# Patient Record
Sex: Female | Born: 1945 | Race: White | Hispanic: No | Smoking: Never smoker
Health system: Southern US, Community
[De-identification: ages and names within clinical notes are randomized; demographics above are authoritative.]

## PROBLEM LIST (undated history)

## (undated) DIAGNOSIS — I341 Nonrheumatic mitral (valve) prolapse: Secondary | ICD-10-CM

## (undated) DIAGNOSIS — F418 Other specified anxiety disorders: Secondary | ICD-10-CM

## (undated) DIAGNOSIS — E611 Iron deficiency: Secondary | ICD-10-CM

## (undated) DIAGNOSIS — G3184 Mild cognitive impairment, so stated: Secondary | ICD-10-CM

## (undated) HISTORY — PX: TEAR DUCT PROBING: SHX793

---

## 1999-03-12 ENCOUNTER — Ambulatory Visit (HOSPITAL_COMMUNITY): Admission: RE | Admit: 1999-03-12 | Discharge: 1999-03-12 | Payer: Self-pay | Admitting: Gastroenterology

## 1999-11-13 ENCOUNTER — Encounter (INDEPENDENT_AMBULATORY_CARE_PROVIDER_SITE_OTHER): Payer: Self-pay | Admitting: Specialist

## 1999-11-13 ENCOUNTER — Ambulatory Visit (HOSPITAL_COMMUNITY): Admission: RE | Admit: 1999-11-13 | Discharge: 1999-11-13 | Payer: Self-pay | Admitting: Urology

## 2000-03-25 ENCOUNTER — Encounter (INDEPENDENT_AMBULATORY_CARE_PROVIDER_SITE_OTHER): Payer: Self-pay

## 2000-03-25 ENCOUNTER — Ambulatory Visit (HOSPITAL_COMMUNITY): Admission: RE | Admit: 2000-03-25 | Discharge: 2000-03-25 | Payer: Self-pay | Admitting: Urology

## 2001-02-27 ENCOUNTER — Encounter: Admission: RE | Admit: 2001-02-27 | Discharge: 2001-02-27 | Payer: Self-pay | Admitting: Emergency Medicine

## 2001-02-27 ENCOUNTER — Encounter: Payer: Self-pay | Admitting: Emergency Medicine

## 2001-06-03 ENCOUNTER — Encounter: Payer: Self-pay | Admitting: Emergency Medicine

## 2001-06-03 ENCOUNTER — Encounter: Admission: RE | Admit: 2001-06-03 | Discharge: 2001-06-03 | Payer: Self-pay | Admitting: Emergency Medicine

## 2002-07-15 ENCOUNTER — Encounter: Admission: RE | Admit: 2002-07-15 | Discharge: 2002-07-15 | Payer: Self-pay | Admitting: Emergency Medicine

## 2002-07-15 ENCOUNTER — Encounter: Payer: Self-pay | Admitting: Emergency Medicine

## 2003-02-10 ENCOUNTER — Encounter: Admission: RE | Admit: 2003-02-10 | Discharge: 2003-02-10 | Payer: Self-pay | Admitting: Emergency Medicine

## 2003-02-10 ENCOUNTER — Encounter: Payer: Self-pay | Admitting: Emergency Medicine

## 2003-03-17 ENCOUNTER — Encounter: Admission: RE | Admit: 2003-03-17 | Discharge: 2003-03-17 | Payer: Self-pay | Admitting: Emergency Medicine

## 2003-03-17 ENCOUNTER — Encounter: Payer: Self-pay | Admitting: Emergency Medicine

## 2004-03-15 ENCOUNTER — Ambulatory Visit (HOSPITAL_COMMUNITY): Admission: RE | Admit: 2004-03-15 | Discharge: 2004-03-15 | Payer: Self-pay | Admitting: Emergency Medicine

## 2005-03-12 ENCOUNTER — Encounter: Admission: RE | Admit: 2005-03-12 | Discharge: 2005-03-12 | Payer: Self-pay | Admitting: Emergency Medicine

## 2005-04-12 ENCOUNTER — Ambulatory Visit (HOSPITAL_COMMUNITY): Admission: RE | Admit: 2005-04-12 | Discharge: 2005-04-12 | Payer: Self-pay | Admitting: Emergency Medicine

## 2006-01-16 ENCOUNTER — Encounter: Admission: RE | Admit: 2006-01-16 | Discharge: 2006-01-16 | Payer: Self-pay | Admitting: Emergency Medicine

## 2006-03-24 ENCOUNTER — Encounter: Admission: RE | Admit: 2006-03-24 | Discharge: 2006-03-24 | Payer: Self-pay | Admitting: Emergency Medicine

## 2007-08-19 ENCOUNTER — Encounter: Admission: RE | Admit: 2007-08-19 | Discharge: 2007-08-19 | Payer: Self-pay | Admitting: Emergency Medicine

## 2008-07-29 ENCOUNTER — Encounter: Admission: RE | Admit: 2008-07-29 | Discharge: 2008-07-29 | Payer: Self-pay | Admitting: Emergency Medicine

## 2009-08-10 ENCOUNTER — Encounter: Admission: RE | Admit: 2009-08-10 | Discharge: 2009-08-10 | Payer: Self-pay | Admitting: Family Medicine

## 2010-08-14 ENCOUNTER — Other Ambulatory Visit: Admission: RE | Admit: 2010-08-14 | Discharge: 2010-08-14 | Payer: Self-pay | Admitting: Family Medicine

## 2011-10-01 ENCOUNTER — Other Ambulatory Visit: Payer: Self-pay | Admitting: Family Medicine

## 2011-10-01 DIAGNOSIS — Z1231 Encounter for screening mammogram for malignant neoplasm of breast: Secondary | ICD-10-CM

## 2011-10-11 ENCOUNTER — Ambulatory Visit
Admission: RE | Admit: 2011-10-11 | Discharge: 2011-10-11 | Disposition: A | Discharge status: Home / Self Care | Payer: BC Managed Care – PPO | Source: Ambulatory Visit | Attending: Family Medicine | Admitting: Family Medicine

## 2011-10-11 DIAGNOSIS — Z1231 Encounter for screening mammogram for malignant neoplasm of breast: Secondary | ICD-10-CM

## 2012-07-30 ENCOUNTER — Inpatient Hospital Stay (HOSPITAL_COMMUNITY)
Admission: EM | Admit: 2012-07-30 | Discharge: 2012-07-31 | DRG: 641 | Disposition: A | Discharge status: Home / Self Care | Payer: Medicare Other | Attending: Family Medicine | Admitting: Family Medicine

## 2012-07-30 ENCOUNTER — Encounter (HOSPITAL_COMMUNITY): Payer: Self-pay | Admitting: Emergency Medicine

## 2012-07-30 DIAGNOSIS — E162 Hypoglycemia, unspecified: Secondary | ICD-10-CM | POA: Diagnosis present

## 2012-07-30 DIAGNOSIS — E871 Hypo-osmolality and hyponatremia: Principal | ICD-10-CM | POA: Diagnosis present

## 2012-07-30 DIAGNOSIS — R634 Abnormal weight loss: Secondary | ICD-10-CM | POA: Diagnosis present

## 2012-07-30 DIAGNOSIS — E161 Other hypoglycemia: Secondary | ICD-10-CM | POA: Diagnosis present

## 2012-07-30 DIAGNOSIS — IMO0002 Reserved for concepts with insufficient information to code with codable children: Secondary | ICD-10-CM | POA: Diagnosis present

## 2012-07-30 DIAGNOSIS — E876 Hypokalemia: Secondary | ICD-10-CM | POA: Diagnosis present

## 2012-07-30 DIAGNOSIS — R7402 Elevation of levels of lactic acid dehydrogenase (LDH): Secondary | ICD-10-CM | POA: Diagnosis present

## 2012-07-30 DIAGNOSIS — Y9379 Activity, other specified sports and athletics: Secondary | ICD-10-CM

## 2012-07-30 DIAGNOSIS — Z833 Family history of diabetes mellitus: Secondary | ICD-10-CM

## 2012-07-30 DIAGNOSIS — R4182 Altered mental status, unspecified: Secondary | ICD-10-CM

## 2012-07-30 DIAGNOSIS — Z681 Body mass index (BMI) 19 or less, adult: Secondary | ICD-10-CM

## 2012-07-30 DIAGNOSIS — R7401 Elevation of levels of liver transaminase levels: Secondary | ICD-10-CM | POA: Diagnosis present

## 2012-07-30 HISTORY — DX: Iron deficiency: E61.1

## 2012-07-30 HISTORY — DX: Hypoglycemia, unspecified: E16.2

## 2012-07-30 HISTORY — DX: Nonrheumatic mitral (valve) prolapse: I34.1

## 2012-07-30 HISTORY — DX: Hypo-osmolality and hyponatremia: E87.1

## 2012-07-30 LAB — CBC WITH DIFFERENTIAL/PLATELET
Lymphocytes Relative: 24 % (ref 12–46)
Lymphs Abs: 1.3 10*3/uL (ref 0.7–4.0)
MCHC: 36.3 g/dL — ABNORMAL HIGH (ref 30.0–36.0)
Monocytes Relative: 4 % (ref 3–12)
Neutro Abs: 4.1 10*3/uL (ref 1.7–7.7)
Neutrophils Relative %: 72 % (ref 43–77)
Platelets: 188 10*3/uL (ref 150–400)
RDW: 12.1 % (ref 11.5–15.5)
WBC: 5.7 10*3/uL (ref 4.0–10.5)

## 2012-07-30 LAB — URINALYSIS, ROUTINE W REFLEX MICROSCOPIC
Bilirubin Urine: NEGATIVE
Hgb urine dipstick: NEGATIVE
Leukocytes, UA: NEGATIVE
Nitrite: NEGATIVE
Specific Gravity, Urine: 1.007 (ref 1.005–1.030)
Urobilinogen, UA: 0.2 mg/dL (ref 0.0–1.0)
pH: 7 (ref 5.0–8.0)

## 2012-07-30 LAB — COMPREHENSIVE METABOLIC PANEL
Albumin: 3.8 g/dL (ref 3.5–5.2)
BUN: 9 mg/dL (ref 6–23)
CO2: 20 mEq/L (ref 19–32)
Chloride: 83 mEq/L — ABNORMAL LOW (ref 96–112)
Creatinine, Ser: 0.67 mg/dL (ref 0.50–1.10)
GFR calc Af Amer: >90 mL/min (ref >90)
GFR calc non Af Amer: >90 mL/min (ref >90)
Glucose, Bld: 228 mg/dL — ABNORMAL HIGH (ref 70–99)
Total Protein: 6.3 g/dL (ref 6.0–8.3)

## 2012-07-30 LAB — POCT I-STAT TROPONIN I

## 2012-07-30 LAB — GLUCOSE, CAPILLARY: Glucose-Capillary: 182 mg/dL — ABNORMAL HIGH (ref 70–99)

## 2012-07-30 MED ORDER — SODIUM CHLORIDE 0.9 % IV BOLUS (SEPSIS)
500.0000 mL | Freq: Once | INTRAVENOUS | Status: AC
  Administered 2012-07-30: 500 mL via INTRAVENOUS

## 2012-07-30 NOTE — ED Notes (Signed)
MD at bedside. 

## 2012-07-30 NOTE — ED Notes (Signed)
Per EMS , pt. Was brought in due to hypoglycemia , was running on a school  Field  with a group and reported that pt. Became incoherent and disoriented. Denies LOC. Pt. Denies of being diabetic.

## 2012-07-30 NOTE — ED Notes (Signed)
Spoke with Cordelia Pen from lab.  Pt's Na+ critically low at 118.  Seizure precautions initiated.

## 2012-07-30 NOTE — ED Provider Notes (Signed)
History     CSN: [id number]  Arrival date & time 07/30/12  Barry Brunner   First MD Initiated Contact with Patient 07/30/12 2033      Chief Complaint  Patient presents with  . Hypoglycemia    (Consider location/radiation/quality/duration/timing/severity/associated sxs/prior treatment) HPI Pt with decreased PO intake today and ran with running club 1.5 miles. Afterward while stretching she was unresponsive and had spasms to Bl hands. Pt was helped to ground by other runners and EMS was called. CBG was in the 40's and correct by paramedics. Pt now states she feels better but is fatigued with paraesthesias to hands and feet. Pt admits to decreased PO intake for some time and previous episodes of paraesthesias with running. No CP , SOB, HA, neck pain, trauma, N/V/D, fever.  History reviewed. No pertinent past medical history.  History reviewed. No pertinent past surgical history.  History reviewed. No pertinent family history.  History  Substance Use Topics  . Smoking status: Never Smoker   . Smokeless tobacco: Not on file  . Alcohol Use: No    OB History    Grav Para Term Preterm Abortions TAB SAB Ect Mult Living                  Review of Systems  Constitutional: Positive for diaphoresis and fatigue. Negative for fever and chills.  HENT: Negative for neck pain and neck stiffness.   Respiratory: Negative for cough, shortness of breath and wheezing.   Cardiovascular: Negative for chest pain, palpitations and leg swelling.  Gastrointestinal: Negative for nausea, vomiting and abdominal pain.  Genitourinary: Negative for dysuria.  Skin: Negative for rash.  Neurological: Positive for numbness. Negative for dizziness, seizures and headaches.  Psychiatric/Behavioral: Positive for confusion.    Allergies  Dairy aid  Home Medications  No current outpatient prescriptions on file.  BP 112/59  Pulse 71  Temp 97.8 F (36.6 C) (Oral)  Resp 7  SpO2 100%  Physical Exam  Nursing note  and vitals reviewed. Constitutional: She is oriented to person, place, and time. She appears well-developed and well-nourished. No distress.  HENT:  Head: Normocephalic and atraumatic.  Mouth/Throat: Oropharynx is clear and moist.  Eyes: EOM are normal. Pupils are equal, round, and reactive to light.  Neck: Normal range of motion. Neck supple.       No meningismus, no midline TTP  Cardiovascular: Normal rate and regular rhythm.   Pulmonary/Chest: Effort normal and breath sounds normal. No respiratory distress. She has no wheezes. She has no rales. She exhibits no tenderness.  Abdominal: Soft. Bowel sounds are normal. She exhibits no mass. There is no tenderness. There is no rebound and no guarding.  Musculoskeletal: Normal range of motion. She exhibits no edema and no tenderness.  Neurological: She is alert and oriented to person, place, and time.       5/5 motor, Pt still reports paraesthesias though gross sensation intact  Skin: Skin is warm. No rash noted. She is diaphoretic. No erythema.  Psychiatric: She has a normal mood and affect. Her behavior is normal.    ED Course  Procedures (including critical care time)  Labs Reviewed  GLUCOSE, CAPILLARY - Abnormal; Notable for the following:    Glucose-Capillary 182 (*)     All other components within normal limits  CBC WITH DIFFERENTIAL - Abnormal; Notable for the following:    MCH 34.6 (*)     MCHC 36.3 (*)     All other components within normal limits  COMPREHENSIVE  METABOLIC PANEL - Abnormal; Notable for the following:    Sodium 118 (*)     Potassium 3.2 (*)     Chloride 83 (*)     Glucose, Bld 228 (*)     AST 93 (*)     ALT 102 (*)     All other components within normal limits  URINALYSIS, ROUTINE W REFLEX MICROSCOPIC - Abnormal; Notable for the following:    Glucose, UA 250 (*)     All other components within normal limits  POCT I-STAT TROPONIN I   No results found.   1. Hyponatremia   2. Hypoglycemia   3. Altered  mental status      Date: 07/30/2012  Rate:86  Rhythm: normal sinus rhythm  QRS Axis: normal  Intervals: normal  ST/T Wave abnormalities: normal  Conduction Disutrbances:none  Narrative Interpretation:   Old EKG Reviewed: changes noted P wave inversion in II, III, V3, V4, V5, and V6   MDM   Discussed with hospitalist and intensivist. [facility] will admit.        Loren Racer, MD 07/30/12 [phone]

## 2012-07-31 ENCOUNTER — Encounter (HOSPITAL_COMMUNITY): Payer: Self-pay | Admitting: Internal Medicine

## 2012-07-31 DIAGNOSIS — R634 Abnormal weight loss: Secondary | ICD-10-CM | POA: Diagnosis present

## 2012-07-31 DIAGNOSIS — E876 Hypokalemia: Secondary | ICD-10-CM

## 2012-07-31 HISTORY — DX: Hypokalemia: E87.6

## 2012-07-31 LAB — BASIC METABOLIC PANEL
GFR calc Af Amer: >90 mL/min (ref >90)
GFR calc non Af Amer: >90 mL/min (ref >90)
Potassium: 4 mEq/L (ref 3.5–5.1)
Sodium: 139 mEq/L (ref 135–145)

## 2012-07-31 LAB — OSMOLALITY: Osmolality: 276 mOsm/kg (ref 275–300)

## 2012-07-31 LAB — CBC
Hemoglobin: 12.4 g/dL (ref 12.0–15.0)
MCH: 34 pg (ref 26.0–34.0)
MCV: 94.5 fL (ref 78.0–100.0)
RBC: 3.65 MIL/uL — ABNORMAL LOW (ref 3.87–5.11)
WBC: 4.1 10*3/uL (ref 4.0–10.5)

## 2012-07-31 LAB — COMPREHENSIVE METABOLIC PANEL
AST: 52 U/L — ABNORMAL HIGH (ref 0–37)
Albumin: 3.2 g/dL — ABNORMAL LOW (ref 3.5–5.2)
Calcium: 8.2 mg/dL — ABNORMAL LOW (ref 8.4–10.5)
Creatinine, Ser: 0.61 mg/dL (ref 0.50–1.10)
GFR calc non Af Amer: >90 mL/min (ref >90)

## 2012-07-31 LAB — OSMOLALITY, URINE: Osmolality, Ur: 86 mOsm/kg — ABNORMAL LOW (ref 390–1090)

## 2012-07-31 LAB — CORTISOL: Cortisol, Plasma: 9.2 ug/dL

## 2012-07-31 LAB — SODIUM, URINE, RANDOM: Sodium, Ur: 29 mEq/L

## 2012-07-31 LAB — HEMOGLOBIN A1C: Mean Plasma Glucose: 103 mg/dL (ref <117)

## 2012-07-31 LAB — TSH: TSH: 3.016 u[IU]/mL (ref 0.350–4.500)

## 2012-07-31 MED ORDER — ACETAMINOPHEN 650 MG RE SUPP: 650.0000 mg | Freq: Four times a day (QID) | RECTAL | Status: DC | PRN

## 2012-07-31 MED ORDER — ONDANSETRON HCL 4 MG/2ML IJ SOLN
4.0000 mg | Freq: Four times a day (QID) | INTRAMUSCULAR | Status: DC | PRN
Start: 2012-07-31 — End: 2012-07-31

## 2012-07-31 MED ORDER — SODIUM CHLORIDE 0.9 % IV SOLN
INTRAVENOUS | Status: DC
  Administered 2012-07-31: 03:00:00 via INTRAVENOUS

## 2012-07-31 MED ORDER — ONDANSETRON HCL 4 MG PO TABS: 4.0000 mg | ORAL_TABLET | Freq: Four times a day (QID) | ORAL | Status: DC | PRN

## 2012-07-31 MED ORDER — ACETAMINOPHEN 325 MG PO TABS: 650.0000 mg | ORAL_TABLET | Freq: Four times a day (QID) | ORAL | Status: DC | PRN

## 2012-07-31 MED ORDER — HEPARIN SODIUM (PORCINE) 5000 UNIT/ML IJ SOLN
5000.0000 [IU] | Freq: Three times a day (TID) | INTRAMUSCULAR | Status: DC
  Administered 2012-07-31: 5000 [IU] via SUBCUTANEOUS
  Filled 2012-07-31 (×4): qty 1

## 2012-07-31 MED ORDER — SODIUM CHLORIDE 0.9 % IJ SOLN
3.0000 mL | Freq: Two times a day (BID) | INTRAMUSCULAR | Status: DC
  Administered 2012-07-31: 3 mL via INTRAVENOUS

## 2012-07-31 NOTE — Progress Notes (Signed)
CARE MANAGEMENT NOTE 07/31/2012  Patient:  Mackenzie Green, Mackenzie Green   Account Number:  [id number]  Date Initiated:  07/31/2012  Documentation initiated by:  Briyonna Omara  Subjective/Objective Assessment:   admitted with na level 118, ams,     Action/Plan:   lives at home very activie   Anticipated DC Date:  08/03/2012   Anticipated DC Plan:  HOME/SELF CARE  In-house referral  NA      DC Planning Services  NA      [facility] Choice  NA   Choice offered to / List presented to:  NA   DME arranged  NA      DME agency  NA     HH arranged  NA      HH agency  NA   Status of service:  In process, will continue to follow Medicare Important Message given?  NA - LOS <3 / Initial given by admissions (If response is "NO", the following Medicare IM given date fields will be blank) Date Medicare IM given:   Date Additional Medicare IM given:    Discharge Disposition:    Per UR Regulation:  Reviewed for med. necessity/level of care/duration of stay  If discussed at Long Length of Stay Meetings, dates discussed:    Comments:  [id number] Marcelle Smiling, RN, BSN, CCM No discharge needs present at time of this review Case Management [phone]

## 2012-07-31 NOTE — Discharge Summary (Signed)
Physician Discharge Summary  Mackenzie Green WJX:[id number] DOB: 07-23-1946 DOA: 07/30/2012  PCP: Gweneth Dimitri, MD  Admit date: 07/30/2012 Discharge date: 07/31/2012  Recommendations for Outpatient Follow-up:  1. Follow-up exercise-induced hyponatremia and hypoglycemia. 2. Follow-up pending labs including TSH, cortisol and Hgb A1c.  3. Follow-up mild elevation of serum transaminases as clinically indicated 4. Encourage appropriate weight gain as indicated.  Follow-up Information    Follow up with [facility], MD in 1 week.   Contact information:   [address] [place] [address] [phone]         Discharge Diagnoses:  1. Exercise-induced hyponatremia 2. Exercise-induced hypoglycemia 3. Mild elevation of serum transaminases  Discharge Condition: improved Disposition: home  Diet recommendation: regular diet, added salt and complex carbs  History of present illness:  66 year old woman no significant past medical history presented with history of unresponsiveness post run and hypoglycemic on scene. Found to have profound hyponatremia and admitted.  Hospital Course:  Mackenzie Green was admitted for treatment of hyponatremia. She is quite active at baseline (hiking, yoga) as well as mowing grass in heat 2x/week. Started running one week ago, outside, with increased sweating. Eats a vegetarian diet with little added salt and no processed food. Experienced paraesthesias in hands while running, suspicious for hypoglycemia. Hyponatremia and hypoglycemia resolved with IVF. She is stable for discharge. Counseled extensively on presumed etiology and diet modifications (increased salt and complex carbs daily). Can resume running, follow-up with PCP 1 week. 1. Exercise-induced hyponatremia--Resolved, complicated by vegetarian low salt/processed food diet. TSH, cortisol, urine sodium, urine osmolarity in process but not likely to add much. Counseled on increasing salt intake.   2. Hypoglycemia--Resolved. Exercise-induced. Reported by EMS to have blood glucose in the 40s, when patient brought to the emergency department her blood glucose is 228 (s/p glucose gel). Counseled on adding daily complex carbs to diet.  3. Elevated transaminases--Possibly related to diet. Follow-up as an outpatient.  4. Weight loss--Lost weight last year when husband lef her and has not yet regained it. Counseled on diet.  5. Hypokalemia--Repleted.  Consultants:  none  Procedures:  none  Discharge Instructions  Discharge Orders    Future Orders Please Complete By Expires   Diet general      Increase activity slowly      Discharge instructions      Comments:   May resume physical activity 8/10. Be sure to increase daily salt and complex carbohydrate intake daily (e.g. pickles, canned soups, V-8; Bulgar wheat, quinoa, pasta, bread, etc.) If you experience numbness tingling or confusion with exercise despite dietary modifcation, cease activity and follow-up with your doctor or seek emergency medical attention.     Medication List    Notice       You have not been prescribed any medications.            Follow-up Information    Follow up with [facility], MD in 1 week.   Contact information:   [address] [place] [address] [phone]         The results of significant diagnostics from this hospitalization (including imaging, microbiology, ancillary and laboratory) are listed below for reference.    Microbiology: Recent Results (from the past 240 hour(s))  MRSA PCR SCREENING     Status: Normal   Collection Time   07/31/12  2:52 AM      Component Value Range Status Comment   MRSA by PCR NEGATIVE  NEGATIVE Final     Labs: Basic  Metabolic Panel:  Lab 07/31/12 1610 07/31/12 0322 07/30/12 2008  NA 139 135 118*  K 4.0 3.6 3.2*  CL 106 102 83*  CO2 25 25 20   GLUCOSE 84 93 228*  BUN 6 7 9   CREATININE 0.67 0.61 0.67  CALCIUM 8.4 8.2* 8.7    MG -- -- --  PHOS -- -- --   Liver Function Tests:  Lab 07/31/12 0322 07/30/12 2008  AST 52* 93*  ALT 77* 102*  ALKPHOS 58 67  BILITOT 0.5 0.7  PROT 5.4* 6.3  ALBUMIN 3.2* 3.8   CBC:  Lab 07/31/12 0322 07/30/12 2008  WBC 4.1 5.7  NEUTROABS -- 4.1  HGB 12.4 13.8  HCT 34.5* 38.0  MCV 94.5 95.2  PLT 170 188   CBG:  Lab 07/30/12 1942  GLUCAP 182*    Principal Problem:  *Hyponatremia Active Problems:  Hypoglycemia  Weight loss  Hypokalemia   Time coordinating discharge: 68  Signed:  Brendia Sacks, MD [facility] 07/31/2012, 10:00 AM

## 2012-07-31 NOTE — H&P (Signed)
[facility] History and Physical  OLIA HINDERLITER JXB:[id number] DOB: October 31, 1946 DOA: 07/30/2012  Referring physician: Loren Racer PCP: No primary provider on file.   Chief Complaint: Hyponatremia  HPI:  Mackenzie Green 66 year old Caucasian female with no significant past medical history. She was brought to the hospital by EMS after she was been transiently unresponsive. Patient said she was recently joined running club and she started to run about 30 minutes every other day since last week. She had also a program that she has special diet and she had to drink certain amount of water every day. She started on her routine, she mentioned last time she ran about 2 days ago she has some numbness in her hands which went away when she drank some orange juice. Today in the morning she start to drink a lot of water to prevent found herself from dehydration. She ran from 6 to 6:30 PM, after that she felt the tingling in her hands again, it was reported to her that she was unresponsive for a few moments, EMS was called, her CBG was documented to be in the 40s. Patient was given glucose and was brought to the emergency department for further evaluation. Initial evaluation in the ED showed sodium of 118, blood glucose of 228. Patient is awake and alert, denies any symptoms. Ankle 3  Review of Systems:  Constitutional: negative for anorexia, fevers and sweats Eyes: negative for irritation, redness and visual disturbance Ears, nose, mouth, throat, and face: negative for earaches, epistaxis, nasal congestion and sore throat Respiratory: negative for cough, dyspnea on exertion, sputum and wheezing Cardiovascular: negative for chest pain, dyspnea, lower extremity edema, orthopnea, palpitations and syncope Gastrointestinal: negative for abdominal pain, constipation, diarrhea, melena, nausea and vomiting Genitourinary:negative for dysuria, frequency and hematuria Hematologic/lymphatic: negative for  bleeding, easy bruising and lymphadenopathy Musculoskeletal:negative for arthralgias, muscle weakness and stiff joints Neurological: negative for coordination problems, gait problems, headaches and weakness Endocrine: negative for diabetic symptoms including polydipsia, polyuria and weight loss Allergic/Immunologic: negative for anaphylaxis, hay fever and urticaria  History reviewed. No pertinent past medical history. History reviewed. No pertinent past surgical history. Social History:  reports that she has never smoked. She does not have any smokeless tobacco history on file. She reports that she does not drink alcohol or use illicit drugs. She lives at home, she is ambulatory  Allergies  Allergen Reactions  . Dairy Aid (Lactase)     Family History  Problem Relation Age of Onset  . Diabetes Mother      Prior to Admission medications   Not on File   Physical Exam: Filed Vitals:   07/30/12 2045 07/30/12 2115 07/30/12 2200 07/31/12 0009  BP: 113/60 110/61 112/59 107/57  Pulse: 83 76 71 63  Temp:    98.7 F (37.1 C)  TempSrc:    Oral  Resp: 11 11 7 18   SpO2: 100% 100% 100% 100%   General appearance: alert, cooperative and no distress  Head: Normocephalic, without obvious abnormality, atraumatic  Eyes: conjunctivae/corneas clear. PERRL, EOM's intact. Fundi benign.  Nose: Nares normal. Septum midline. Mucosa normal. No drainage or sinus tenderness.  Throat: lips, mucosa, and tongue normal; teeth and gums normal  Neck: Supple, no masses, no cervical lymphadenopathy, no JVD appreciated, no meningeal signs Resp: clear to auscultation bilaterally  Chest wall: no tenderness  Cardio: regular rate and rhythm, S1, S2 normal, no murmur, click, rub or gallop  GI: soft, non-tender; bowel sounds normal; no masses, no organomegaly  Extremities: extremities  normal, atraumatic, no cyanosis or edema  Skin: Skin color, texture, turgor normal. No rashes or lesions  Neurologic: Alert and  oriented X 3, normal strength and tone. Normal symmetric reflexes. Normal coordination and gait  Labs on Admission:  Basic Metabolic Panel:  Lab 07/30/12 1610  NA 118*  K 3.2*  CL 83*  CO2 20  GLUCOSE 228*  BUN 9  CREATININE 0.67  CALCIUM 8.7  MG --  PHOS --   Liver Function Tests:  Lab 07/30/12 2008  AST 93*  ALT 102*  ALKPHOS 67  BILITOT 0.7  PROT 6.3  ALBUMIN 3.8   No results found for this basename: LIPASE:5,AMYLASE:5 in the last 168 hours No results found for this basename: AMMONIA:5 in the last 168 hours CBC:  Lab 07/30/12 2008  WBC 5.7  NEUTROABS 4.1  HGB 13.8  HCT 38.0  MCV 95.2  PLT 188   Cardiac Enzymes: No results found for this basename: CKTOTAL:5,CKMB:5,CKMBINDEX:5,TROPONINI:5 in the last 168 hours  BNP (last 3 results) No results found for this basename: PROBNP:3 in the last 8760 hours CBG:  Lab 07/30/12 1942  GLUCAP 182*    Radiological Exams on Admission: No results found.    Assessment/Plan Principal Problem:  *Hyponatremia Active Problems:  Hypoglycemia  Weight loss  Hypokalemia   Hyponatremia Has severe hyponatremia, not sure she is symptomatic from the low sodium or from the low blood glucose. Likely the latter. With her being asymptomatic now after the blood glucose was normalized, doubt this is acute. Patient is reporting a lot of water drinking, and vigorous exercises recently. This is might cause hyponatremia. I'll check TSH, cortisol, urine sodium, urine osmolarity. We'll hydrate patient gently with normal saline, the rate will be 70 mL per hour, I will check her BMP every 6 hours to make sure her sodium will not correct faster than 0.5 mEq per hour.  Hypoglycemia Reported by EMS to have blood glucose in the 40s, when patient brought to the emergency department her blood glucose is 228. She's not taking any hypoglycemic medications, she is nondiabetic. Blood glucose corrected, I will check him cortisol, doubt this is from  exercise and poor oral intake. I will check her CBGs every 6 hours to make sure she'll not have another episode of hypoglycemia.  Weight loss Patient mentioned about 10-15 pounds of weight loss, please note that her weight is about 100 pounds, for her this is significant weight loss which account about 10% of her body mass. Denies any loss of appetite, dysphagia or chronic diarrhea. This can be followed as outpatient.  Hypokalemia Replete with oral supplements.   Code Status: Full code Family Communication: Patient said her son Verdon Cummins updated with the plan. Disposition Plan: Inpatient as telemetry bed, likely she'll be appropriate to go back home when she is medically stable.  Time spent: 70 minutes  [facility] Pager [phone]  If 7PM-7AM, please contact night-coverage [email or web address] Password [email or web address] 07/31/2012, 12:44 AM

## 2012-07-31 NOTE — Progress Notes (Signed)
[facility] PROGRESS NOTE  Mackenzie Green Mackenzie Green:[id number] DOB: 04-19-1946 DOA: 07/30/2012 PCP: No primary provider on file.  Assessment/Plan: 1. Exercise-induced hyponatremia--Resolved, complicated by vegetarian low salt/processed food diet. TSH, cortisol, urine sodium, urine osmolarity in process but not likely to add much. Counseled on increasing salt intake. 2. Hypoglycemia--Resolved. Exercise-induced. Reported by EMS to have blood glucose in the 40s, when patient brought to the emergency department her blood glucose is 228 (s/p glucose gel). Counseled on adding daily complex carbs to diet. 3. Elevated transaminases--Possibly related to diet. Follow-up as an outpatient. 4. Weight loss--Lost weight last year when husband lef her and has not yet regained it. Counseled on diet. 5. Hypokalemia--Repleted.  Code Status: full code Family Communication: discussed with son at bedside Disposition Plan: home today  Brendia Sacks, MD  [facility] Team 4 Pager [phone]. If 8PM-8AM, please contact night-coverage at [email or web address], password [facility] 07/31/2012, 8:50 AM  LOS: 1 day   Brief narrative: 66 year old woman no significant past medical history presented with history of unresponsiveness post run and hypoglycemic on scene. Found to have profound hyponatremia and admitted.  Active at baseline (hiking, yoga) as well as mowing grass in heat 2x/week. Started running one week ago, outside, with increased sweating. Eats a vegetarian diet with little added salt and no processed food. Experienced paraesthesias in hands while running, suspicious for hypoglycemia. Hyponatremia and hypoglycemia resolved and stable for discharge. Counseled extensively on presumed etiology and diet modifications (increased salt and complex carbs daily). Can resume running, follow-up with PCP 1 week.  Consultants:  none  Procedures:  none  HPI/Subjective: Feels better, no complaints. Discussed with RN--no  concerns.  Objective: Filed Vitals:   07/31/12 0416 07/31/12 0614 07/31/12 0700 07/31/12 0800  BP: 97/58 107/58 107/57 108/67  Pulse: 57 64 52 55  Temp:    97.8 F (36.6 C)  TempSrc:    Oral  Resp: 15 11 11 12   Height:      Weight:      SpO2: 99% 100% 99% 100%    Intake/Output Summary (Last 24 hours) at 07/31/12 0850 Last data filed at 07/31/12 0800  Gross per 24 hour  Intake    140 ml  Output   3001 ml  Net  -2861 ml   Wt Readings from Last 3 Encounters:  07/31/12 46.176 kg (101 lb 12.8 oz)    Exam:   General:  Appears calm and comfortable. Speech fluent and clear.  Cardiovascular: RRR, no m/r/g. No LE edema.  Respiratory: CTA bilaterally, no w/r/r. Normal respiratory effort.  Data Reviewed: Basic Metabolic Panel:  Lab 07/31/12 6213 07/31/12 0322 07/30/12 2008  NA 139 135 118*  K 4.0 3.6 3.2*  CL 106 102 83*  CO2 25 25 20   GLUCOSE 84 93 228*  BUN 6 7 9   CREATININE 0.67 0.61 0.67  CALCIUM 8.4 8.2* 8.7  MG -- -- --  PHOS -- -- --   Liver Function Tests:  Lab 07/31/12 0322 07/30/12 2008  AST 52* 93*  ALT 77* 102*  ALKPHOS 58 67  BILITOT 0.5 0.7  PROT 5.4* 6.3  ALBUMIN 3.2* 3.8   CBC:  Lab 07/31/12 0322 07/30/12 2008  WBC 4.1 5.7  NEUTROABS -- 4.1  HGB 12.4 13.8  HCT 34.5* 38.0  MCV 94.5 95.2  PLT 170 188   CBG:  Lab 07/30/12 1942  GLUCAP 182*    Recent Results (from the past 240 hour(s))  MRSA PCR SCREENING     Status: Normal  Collection Time   07/31/12  2:52 AM      Component Value Range Status Comment   MRSA by PCR NEGATIVE  NEGATIVE Final     Studies: No results found.  Scheduled Meds:   . heparin  5,000 Units Subcutaneous Q8H  . sodium chloride  500 mL Intravenous Once  . sodium chloride  3 mL Intravenous Q12H   Continuous Infusions:   . sodium chloride 70 mL/hr at 07/31/12 0700    Principal Problem:  *Hyponatremia Active Problems:  Hypoglycemia  Weight loss  Hypokalemia     Brendia Sacks, MD  [facility] Team 4 Pager [phone]. If 8PM-8AM, please contact night-coverage at [email or web address], password [facility] 07/31/2012, 8:50 AM  LOS: 1 day

## 2012-09-21 ENCOUNTER — Other Ambulatory Visit: Payer: Self-pay | Admitting: Dermatology

## 2012-09-24 ENCOUNTER — Other Ambulatory Visit: Payer: Self-pay | Admitting: Family Medicine

## 2012-09-24 DIAGNOSIS — Z1231 Encounter for screening mammogram for malignant neoplasm of breast: Secondary | ICD-10-CM

## 2012-10-12 ENCOUNTER — Ambulatory Visit
Admission: RE | Admit: 2012-10-12 | Discharge: 2012-10-12 | Disposition: A | Discharge status: Home / Self Care | Payer: Medicare Other | Source: Ambulatory Visit | Attending: Family Medicine | Admitting: Family Medicine

## 2012-10-12 DIAGNOSIS — Z1231 Encounter for screening mammogram for malignant neoplasm of breast: Secondary | ICD-10-CM

## 2012-10-14 ENCOUNTER — Other Ambulatory Visit: Payer: Self-pay | Admitting: Family Medicine

## 2012-10-14 DIAGNOSIS — R928 Other abnormal and inconclusive findings on diagnostic imaging of breast: Secondary | ICD-10-CM

## 2012-10-19 ENCOUNTER — Other Ambulatory Visit: Payer: Self-pay | Admitting: Family Medicine

## 2012-10-19 ENCOUNTER — Ambulatory Visit
Admission: RE | Admit: 2012-10-19 | Discharge: 2012-10-19 | Disposition: A | Discharge status: Home / Self Care | Payer: Medicare Other | Source: Ambulatory Visit | Attending: Family Medicine | Admitting: Family Medicine

## 2012-10-19 DIAGNOSIS — R928 Other abnormal and inconclusive findings on diagnostic imaging of breast: Secondary | ICD-10-CM

## 2012-10-19 DIAGNOSIS — Z803 Family history of malignant neoplasm of breast: Secondary | ICD-10-CM

## 2012-11-09 ENCOUNTER — Other Ambulatory Visit: Payer: Self-pay | Admitting: Dermatology

## 2013-09-28 ENCOUNTER — Other Ambulatory Visit: Payer: Self-pay

## 2013-09-28 DIAGNOSIS — Z1231 Encounter for screening mammogram for malignant neoplasm of breast: Secondary | ICD-10-CM

## 2013-10-15 ENCOUNTER — Ambulatory Visit
Admission: RE | Admit: 2013-10-15 | Discharge: 2013-10-15 | Disposition: A | Discharge status: Home / Self Care | Payer: Medicare Other | Source: Ambulatory Visit

## 2013-10-15 DIAGNOSIS — Z1231 Encounter for screening mammogram for malignant neoplasm of breast: Secondary | ICD-10-CM

## 2013-11-08 ENCOUNTER — Other Ambulatory Visit: Payer: Self-pay | Admitting: Family Medicine

## 2013-11-08 ENCOUNTER — Other Ambulatory Visit (HOSPITAL_COMMUNITY)
Admission: RE | Admit: 2013-11-08 | Discharge: 2013-11-08 | Disposition: A | Discharge status: Home / Self Care | Payer: Medicare Other | Source: Ambulatory Visit | Attending: Family Medicine | Admitting: Family Medicine

## 2013-11-08 DIAGNOSIS — Z124 Encounter for screening for malignant neoplasm of cervix: Secondary | ICD-10-CM | POA: Insufficient documentation

## 2015-07-06 ENCOUNTER — Other Ambulatory Visit: Payer: Self-pay

## 2015-07-06 DIAGNOSIS — Z1231 Encounter for screening mammogram for malignant neoplasm of breast: Secondary | ICD-10-CM

## 2015-07-07 ENCOUNTER — Ambulatory Visit
Admission: RE | Admit: 2015-07-07 | Discharge: 2015-07-07 | Disposition: A | Discharge status: Home / Self Care | Payer: Medicare Other | Source: Ambulatory Visit

## 2015-07-07 DIAGNOSIS — Z1231 Encounter for screening mammogram for malignant neoplasm of breast: Secondary | ICD-10-CM

## 2015-07-11 ENCOUNTER — Other Ambulatory Visit: Payer: Self-pay | Admitting: Family Medicine

## 2015-07-11 DIAGNOSIS — R928 Other abnormal and inconclusive findings on diagnostic imaging of breast: Secondary | ICD-10-CM

## 2015-07-24 ENCOUNTER — Ambulatory Visit
Admission: RE | Admit: 2015-07-24 | Discharge: 2015-07-24 | Disposition: A | Discharge status: Home / Self Care | Payer: Medicare Other | Source: Ambulatory Visit | Attending: Family Medicine | Admitting: Family Medicine

## 2015-07-24 DIAGNOSIS — R928 Other abnormal and inconclusive findings on diagnostic imaging of breast: Secondary | ICD-10-CM

## 2015-12-23 ENCOUNTER — Ambulatory Visit (INDEPENDENT_AMBULATORY_CARE_PROVIDER_SITE_OTHER): Payer: Medicare Other | Admitting: Internal Medicine

## 2015-12-23 VITALS — BP 108/60 | HR 88 | Temp 98.8°F | Resp 16 | Ht 66.0 in | Wt 103.2 lb

## 2015-12-23 DIAGNOSIS — J988 Other specified respiratory disorders: Secondary | ICD-10-CM

## 2015-12-23 DIAGNOSIS — J22 Unspecified acute lower respiratory infection: Secondary | ICD-10-CM

## 2015-12-23 MED ORDER — HYDROCODONE-HOMATROPINE 5-1.5 MG/5ML PO SYRP: 5.0000 mL | ORAL_SOLUTION | Freq: Four times a day (QID) | ORAL | Status: DC | PRN

## 2015-12-23 MED ORDER — AZITHROMYCIN 250 MG PO TABS: ORAL_TABLET | ORAL | Status: DC

## 2015-12-23 NOTE — Progress Notes (Signed)
   Subjective:    Patient ID: Mackenzie Green, female    DOB: [birth year], 69 y.o.   MRN: [id number] This chart was scribed for Tami Lin, MD by Marti Sleigh, Medical Scribe. This patient was seen in Room 7 and the patient's care was started a 8:52 AM.  Chief Complaint  Patient presents with  . Cough  . Facial Pain    HPI HPI Comments: Mackenzie Green is a 69 y.o. female who presents to [facility] complaining of cough and facial pain for the last 7 days. She has had fever and chills for the last 24 hours. She was not able to sleep last night due to the cough and chills.  No ST/minimal nasal cong  Lots of sick relatives Past Medical History  Diagnosis Date  . Mitral valve prolapse   . Low iron    Allergies  Allergen Reactions  . Dairy Aid [Lactase]   current meds=none  Had flu shot  Review of Systems  Constitutional: Positive for fever and chills.  HENT: Positive for congestion, rhinorrhea and sinus pressure.   Respiratory: Positive for cough. Negative for shortness of breath.   Psychiatric/Behavioral: Positive for sleep disturbance.  no NVD      Objective:  BP 108/60 mmHg  Pulse 88  Temp(Src) 98.8 F (37.1 C) (Oral)  Resp 16  Ht 5\' 6"  (1.676 m)  Wt 103 lb 3.2 oz (46.811 kg)  BMI 16.66 kg/m2  SpO2 97%  Physical Exam  Constitutional: She is oriented to person, place, and time. She appears well-developed and well-nourished. No distress.  HENT:  Head: Normocephalic and atraumatic.  Eyes: Pupils are equal, round, and reactive to light.  Neck: Neck supple.  Cardiovascular: Normal rate.   Pulmonary/Chest: Effort normal. No respiratory distress.  Rhonchi posteriorly bilaterally.   Musculoskeletal: Normal range of motion.  Neurological: She is alert and oriented to person, place, and time. Coordination normal.  Skin: Skin is warm and dry. She is not diaphoretic.  Psychiatric: She has a normal mood and affect. Her behavior is normal.  Nursing note and vitals  reviewed. shivering in room Delayed expir w/out wheeze     Assessment & Plan:  Lower resp. tract infection  Meds ordered this encounter  Medications  . FLUVIRIN PRESERVATIVE FREE 0.5 ML SUSY    Sig: ADM 0.5ML IM UTD    Refill:  0  . azithromycin (ZITHROMAX) 250 MG tablet    Sig: As packaged    Dispense:  6 tablet    Refill:  0  . HYDROcodone-homatropine (HYCODAN) 5-1.5 MG/5ML syrup    Sig: Take 5 mLs by mouth every 6 (six) hours as needed.    Dispense:  120 mL    Refill:  0     I have completed the patient encounter in its entirety as documented by the scribe, with editing by me where necessary. Robert P. Laney Pastor, M.D.

## 2016-07-31 ENCOUNTER — Telehealth: Payer: Self-pay | Admitting: *Deleted

## 2016-07-31 NOTE — Telephone Encounter (Signed)
error 

## 2016-08-01 ENCOUNTER — Telehealth: Payer: Self-pay | Admitting: Hematology and Oncology

## 2016-08-01 ENCOUNTER — Encounter: Payer: Self-pay | Admitting: Hematology and Oncology

## 2016-08-01 NOTE — Telephone Encounter (Signed)
Appointment scheduled with [facility] on 9/5. She wanted appointment to be scheduled in September because she is going out of town. Letter mailed to the patient and faxed to the referring. Demographics verified.

## 2016-08-27 ENCOUNTER — Encounter: Payer: Self-pay | Admitting: Hematology and Oncology

## 2016-08-27 ENCOUNTER — Ambulatory Visit (HOSPITAL_BASED_OUTPATIENT_CLINIC_OR_DEPARTMENT_OTHER): Payer: Medicare Other | Admitting: Hematology and Oncology

## 2016-08-27 ENCOUNTER — Telehealth: Payer: Self-pay | Admitting: Hematology and Oncology

## 2016-08-27 DIAGNOSIS — D709 Neutropenia, unspecified: Secondary | ICD-10-CM

## 2016-08-27 HISTORY — DX: Neutropenia, unspecified: D70.9

## 2016-08-27 NOTE — Progress Notes (Signed)
[place] CONSULT NOTE  Patient Care Team: Cari Caraway, MD as PCP - General (Family Medicine)  CHIEF COMPLAINTS/PURPOSE OF CONSULTATION:  Mild leukopenia, neutropenia  HISTORY OF PRESENTING ILLNESS:  Mackenzie Green 70 y.o. female is here because of mild neutropenia. Patient is in excellent health part from irritable bowel syndrome with annual checkup which revealed mild leukopenia with a white count 3.3 and a neutrophil count of 1200. Over the past several years she has had persistent mild leukopenia all the way going back to 2013. Her neutrophil count had range from 15 102,016 217 102,016 and now Dr. Tawanna Solo and the most recent blood work done on 07/23/2016. She was also noted to have long-standing macrocytosis with an MCV BP 101-103. She has not had any major symptoms related to the neutropenia. She denies any frequent infections. Denies any fevers or chills. She does not take any medications other than supplements. She denies any signs or symptoms of lupus. Most recent blood work also revealed that her B-12 level was 601.  I reviewed her records extensively and collaborated the history with the patient.  MEDICAL HISTORY: Irritable bowel syndrome Past Medical History:  Diagnosis Date  . Low iron   . Mitral valve prolapse     SURGICAL HISTORY:Basal cell cancer surgery SOCIAL HISTORY: Denies any tobacco: [place] drug use FAMILY HISTORY: Family History  Problem Relation Age of Onset  . Diabetes Mother   Sisters skin cancer  ALLERGIES:  is allergic to dairy aid [lactase].  MEDICATIONS: Takes only supplements which include calcium, vitamin D, multivitamin, vitamin C and iron REVIEW OF SYSTEMS:   Constitutional: Denies fevers, chills or abnormal night sweats Eyes: Denies blurriness of vision, double vision or watery eyes Ears, nose, mouth, throat, and face: Denies mucositis or sore throat Respiratory: Denies cough, dyspnea or wheezes Cardiovascular: Denies  palpitation, chest discomfort or lower extremity swelling Gastrointestinal:  Denies nausea, heartburn or change in bowel habits Skin: Denies abnormal skin rashes Lymphatics: Denies new lymphadenopathy or easy bruising Neurological:Denies numbness, tingling or new weaknesses Behavioral/Psych: Mood is stable, no new changes  Breast:  Denies any palpable lumps or discharge All other systems were reviewed with the patient and are negative.  PHYSICAL EXAMINATION: ECOG PERFORMANCE STATUS: 0 - Asymptomatic  Vitals:   08/27/16 1325  BP: 107/63  Pulse: 63  Resp: 18  Temp: 98.2 F (36.8 C)   Filed Weights   08/27/16 1325  Weight: 99 lb 11.2 oz (45.2 kg)    GENERAL:alert, no distress and comfortable SKIN: skin color, texture, turgor are normal, no rashes or significant lesions EYES: normal, conjunctiva are pink and non-injected, sclera clear OROPHARYNX:no exudate, no erythema and lips, buccal mucosa, and tongue normal  NECK: supple, thyroid normal size, non-tender, without nodularity LYMPH:  no palpable lymphadenopathy in the cervical, axillary or inguinal LUNGS: clear to auscultation and percussion with normal breathing effort HEART: regular rate & rhythm and no murmurs and no lower extremity edema ABDOMEN:abdomen soft, non-tender and normal bowel sounds Musculoskeletal:no cyanosis of digits and no clubbing  PSYCH: alert & oriented x 3 with fluent speech NEURO: no focal motor/sensory deficits   LABORATORY DATA:  I have reviewed the data as listed Lab Results  Component Value Date   WBC 4.1 07/31/2012   HGB 12.4 07/31/2012   HCT 34.5 (L) 07/31/2012   MCV 94.5 07/31/2012   PLT 170 07/31/2012   Lab Results  Component Value Date   NA 139 07/31/2012   K 4.0 07/31/2012  CL 106 07/31/2012   CO2 25 07/31/2012    RADIOGRAPHIC STUDIES: I have personally reviewed the radiological reports and agreed with the findings in the report.  ASSESSMENT AND PLAN:  Neutropenia  ([place]) Most recent neutrophil count on 07/23/2016 was 1200 Previous neutrophil counts on 07/03/2015 was 1700 And the neutrophil count on 11/08/2013 was 1500   Because of the low neutrophil count, the lymphocyte percentage appears to be artificially elevated  I discussed with the patient the differential diagnosis of neutropenia. 1. Nutritional causes but L-38 or folic acid deficiencies: Recent B-12 level was 619 2. immunological causes like lupus: Patient is asymptomatic and preferred not to be tested for lupus 3. Medications: Patient does not take any medications 4. Bone marrow disorders: The slight macrocytosis suggests that there may be a bone marrow dysfunction like myelodysplastic syndrome. However since she is asymptomatic, we elected not to pursue a bone marrow biopsy at this time. If however her ANC drops below 1000, we will be planning to do a bone marrow biopsy. 5. Cyclical neutropenia is a possibility as well.  Patient remains completely asymptomatic without any infections or any other concerns. Plan: Return to clinic in 6 months with labs and follow-up.  Patient is a retired Engineer, water  All questions were answered. The patient knows to call the clinic with any problems, questions or concerns.    Rulon Eisenmenger, MD 08/27/16

## 2016-08-27 NOTE — Telephone Encounter (Signed)
appt made and avs printed °

## 2016-08-27 NOTE — Assessment & Plan Note (Signed)
Most recent neutrophil count on 07/23/2016 was 1200 Previous neutrophil counts on 07/03/2015 was 1700 And the neutrophil count on 11/08/2013 was 1500   Because of the low neutrophil count, the lymphocyte percentage appears to be artificially elevated  I discussed with the patient the differential diagnosis of neutropenia. 1. Nutritional causes but Y-05 or folic acid deficiencies: Recent B-12 level was 619 2. immunological causes like lupus: Patient is asymptomatic and preferred not to be tested for lupus 3. Medications: Patient does not take any medications 4. Bone marrow disorders: The slight macrocytosis suggests that there may be a bone marrow dysfunction like myelodysplastic syndrome. However since she is asymptomatic, we elected not to pursue a bone marrow biopsy at this time. If however her ANC drops below 1000, we will be planning to do a bone marrow biopsy. 5. Cyclical neutropenia is a possibility as well.  Patient remains completely asymptomatic without any infections or any other concerns. Plan: Return to clinic in 6 months with labs and follow-up.

## 2017-02-27 ENCOUNTER — Ambulatory Visit (HOSPITAL_BASED_OUTPATIENT_CLINIC_OR_DEPARTMENT_OTHER): Payer: Medicare Other | Admitting: Hematology and Oncology

## 2017-02-27 ENCOUNTER — Encounter: Payer: Self-pay | Admitting: Hematology and Oncology

## 2017-02-27 ENCOUNTER — Other Ambulatory Visit (HOSPITAL_BASED_OUTPATIENT_CLINIC_OR_DEPARTMENT_OTHER): Payer: Medicare Other

## 2017-02-27 DIAGNOSIS — D709 Neutropenia, unspecified: Secondary | ICD-10-CM

## 2017-02-27 LAB — CBC WITH DIFFERENTIAL/PLATELET
BASO%: 0.7 % (ref 0.0–2.0)
Basophils Absolute: 0 10*3/uL (ref 0.0–0.1)
EOS%: 1 % (ref 0.0–7.0)
Eosinophils Absolute: 0 10*3/uL (ref 0.0–0.5)
HCT: 39.4 % (ref 34.8–46.6)
HGB: 13.4 g/dL (ref 11.6–15.9)
LYMPH%: 30.6 % (ref 14.0–49.7)
MCH: 34.1 pg — ABNORMAL HIGH (ref 25.1–34.0)
MCHC: 34 g/dL (ref 31.5–36.0)
MCV: 100.5 fL (ref 79.5–101.0)
MONO#: 0.3 10*3/uL (ref 0.1–0.9)
MONO%: 7.2 % (ref 0.0–14.0)
NEUT#: 2.9 10*3/uL (ref 1.5–6.5)
NEUT%: 60.5 % (ref 38.4–76.8)
Platelets: 180 10*3/uL (ref 145–400)
RBC: 3.92 10*6/uL (ref 3.70–5.45)
RDW: 12.6 % (ref 11.2–14.5)
WBC: 4.8 10*3/uL (ref 3.9–10.3)
lymph#: 1.5 10*3/uL (ref 0.9–3.3)

## 2017-02-27 NOTE — Assessment & Plan Note (Signed)
Most recent neutrophil count on 07/23/2016 was 1200 Previous neutrophil counts on 07/03/2015 was 1700 And the neutrophil count on 11/08/2013 was 1500  Today's ANC 2900  I discussed with the patient that her neutrophil count is fully recovered. Potential causes could include viral illnesses, medications, stress on the bone marrow, autoimmune causes or cyclical/seasonal neutropenia. There is no major concern for bone marrow disorder. At this point I recommended that she can be seen by [place] on an as-needed basis.  Please do not as stated call us back if there are any further questions or concerns regarding her neutrophil counts.

## 2017-02-27 NOTE — Progress Notes (Signed)
Patient Care Team: Leighton Ruff, MD as PCP - General (Family Medicine)  DIAGNOSIS:  Encounter Diagnosis  Name Primary?  . 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 She was asymptomatic and we decided to watch and monitor. She is here for six-month follow-up. She reports that her health has been extremely good. Denies any new problems or concerns.  REVIEW OF SYSTEMS:   Constitutional: Denies fevers, chills or abnormal weight loss Eyes: Denies blurriness of vision Ears, nose, mouth, throat, and face: Denies mucositis or sore throat Respiratory: Denies cough, dyspnea or wheezes Cardiovascular: Denies palpitation, chest discomfort Gastrointestinal:  Denies nausea, heartburn or change in bowel habits Skin: Denies abnormal skin rashes Lymphatics: Denies new lymphadenopathy or easy bruising Neurological:Denies numbness, tingling or new weaknesses Behavioral/Psych: Mood is stable, no new changes  Extremities: No lower extremity edema Breast:  denies any pain or lumps or nodules in either breasts All other systems were reviewed with the patient and are negative.  I have reviewed the past medical history, past surgical history, social history and family history with the patient and they are unchanged from previous note.  ALLERGIES:  is allergic to dairy aid [lactase].  MEDICATIONS:  No current outpatient prescriptions on file.   No current facility-administered medications for this visit.     PHYSICAL EXAMINATION: ECOG PERFORMANCE STATUS: 0 - Asymptomatic  Vitals:   02/27/17 1535  BP: (!) 115/46  Pulse: 60  Resp: 20  Temp: 98 F (36.7 C)   Filed Weights   02/27/17 1535  Weight: 99 lb 9.6 oz (45.2 kg)    GENERAL:alert, no distress and comfortable SKIN: skin color, texture, turgor  are normal, no rashes or significant lesions EYES: normal, Conjunctiva are pink and non-injected, sclera clear OROPHARYNX:no exudate, no erythema and lips, buccal mucosa, and tongue normal  NECK: supple, thyroid normal size, non-tender, without nodularity LYMPH:  no palpable lymphadenopathy in the cervical, axillary or inguinal LUNGS: clear to auscultation and percussion with normal breathing effort HEART: regular rate & rhythm and no murmurs and no lower extremity edema ABDOMEN:abdomen soft, non-tender and normal bowel sounds MUSCULOSKELETAL:no cyanosis of digits and no clubbing  NEURO: alert & oriented x 3 with fluent speech, no focal motor/sensory deficits EXTREMITIES: No lower extremity edema  LABORATORY DATA:  I have reviewed the data as listed   Chemistry      Component Value Date/Time   NA 139 07/31/2012 0808   K 4.0 07/31/2012 0808   CL 106 07/31/2012 0808   CO2 25 07/31/2012 0808   BUN 6 07/31/2012 0808   CREATININE 0.67 07/31/2012 0808      Component Value Date/Time   CALCIUM 8.4 07/31/2012 0808   ALKPHOS 58 07/31/2012 0322   AST 52 (H) 07/31/2012 0322   ALT 77 (H) 07/31/2012 0322   BILITOT 0.5 07/31/2012 0322       Lab Results  Component Value Date   WBC 4.8 02/27/2017   HGB 13.4 02/27/2017   HCT 39.4 02/27/2017   MCV 100.5 02/27/2017   PLT 180 02/27/2017   NEUTROABS 2.9 02/27/2017    ASSESSMENT & PLAN:  Neutropenia ([facility]) Most recent neutrophil count on 07/23/2016 was 1200 Previous neutrophil counts on 07/03/2015 was 1700 And the neutrophil count on 11/08/2013 was 1500  Today's ANC 2900  I discussed with the patient that her  neutrophil count is fully recovered. Potential causes could include viral illnesses, medications, stress on the bone marrow, autoimmune causes or cyclical/seasonal neutropenia. There is no major concern for bone marrow disorder. At this point I recommended that she can be seen by [place] on an as-needed basis.  Please do not Hesitate to  call us back if there are any further questions or concerns regarding her neutrophil counts.  I spent 15 minutes talking to the patient of which more than half was spent in counseling and coordination of care.  No orders of the defined types were placed in this encounter.  The patient has a good understanding of the overall plan. she agrees with it. she will call with any problems that may develop before the next visit here.   Rulon Eisenmenger, MD 02/27/17

## 2017-08-22 ENCOUNTER — Other Ambulatory Visit: Payer: Self-pay | Admitting: Family Medicine

## 2017-08-22 DIAGNOSIS — Z1231 Encounter for screening mammogram for malignant neoplasm of breast: Secondary | ICD-10-CM

## 2017-09-11 ENCOUNTER — Ambulatory Visit
Admission: RE | Admit: 2017-09-11 | Discharge: 2017-09-11 | Disposition: A | Discharge status: Home / Self Care | Payer: Medicare Other | Source: Ambulatory Visit | Attending: Family Medicine | Admitting: Family Medicine

## 2017-09-11 DIAGNOSIS — Z1231 Encounter for screening mammogram for malignant neoplasm of breast: Secondary | ICD-10-CM

## 2018-09-21 ENCOUNTER — Other Ambulatory Visit: Payer: Self-pay | Admitting: Family Medicine

## 2018-09-21 DIAGNOSIS — Z1231 Encounter for screening mammogram for malignant neoplasm of breast: Secondary | ICD-10-CM

## 2018-10-26 ENCOUNTER — Ambulatory Visit
Admission: RE | Admit: 2018-10-26 | Discharge: 2018-10-26 | Disposition: A | Discharge status: Home / Self Care | Payer: Medicare Other | Source: Ambulatory Visit | Attending: Family Medicine | Admitting: Family Medicine

## 2018-10-26 DIAGNOSIS — Z1231 Encounter for screening mammogram for malignant neoplasm of breast: Secondary | ICD-10-CM

## 2020-01-18 ENCOUNTER — Ambulatory Visit: Payer: Medicare Other

## 2020-01-27 ENCOUNTER — Ambulatory Visit: Payer: Medicare Other | Attending: Internal Medicine

## 2020-01-27 DIAGNOSIS — Z23 Encounter for immunization: Secondary | ICD-10-CM | POA: Insufficient documentation

## 2020-01-27 NOTE — Progress Notes (Signed)
   Covid-19 Vaccination Clinic  Name:  Mackenzie Green    MRN: [id number] DOB: October 27, 1946  01/27/2020  Mackenzie Green was observed post Covid-19 immunization for 15 minutes without incidence. She was provided with Vaccine Information Sheet and instruction to access the V-Safe system.   Mackenzie Green was instructed to call 911 with any severe reactions post vaccine: Marland Kitchen Difficulty breathing  . Swelling of your face and throat  . A fast heartbeat  . A bad rash all over your body  . Dizziness and weakness    Immunizations Administered    Name Date Dose VIS Date Route   Pfizer COVID-19 Vaccine 01/27/2020  9:35 AM 0.3 mL 12/03/2019 Intramuscular   Manufacturer: [place]   Lot: [id number]   [place]: [id number]

## 2020-02-04 ENCOUNTER — Ambulatory Visit: Payer: Medicare Other

## 2020-02-21 ENCOUNTER — Ambulatory Visit: Payer: Medicare Other | Attending: Internal Medicine

## 2020-02-21 DIAGNOSIS — Z23 Encounter for immunization: Secondary | ICD-10-CM | POA: Insufficient documentation

## 2020-02-21 NOTE — Progress Notes (Signed)
   Covid-19 Vaccination Clinic  Name:  Mackenzie Green    MRN: [id number] DOB: Jan 11, 1946  02/21/2020  [name] was observed post Covid-19 immunization for 15 minutes without incidence. She was provided with Vaccine Information Sheet and instruction to access the V-Safe system.   [name] was instructed to call 911 with any severe reactions post vaccine: Marland Kitchen Difficulty breathing  . Swelling of your face and throat  . A fast heartbeat  . A bad rash all over your body  . Dizziness and weakness    Immunizations Administered    Name Date Dose VIS Date Route   Pfizer COVID-19 Vaccine 02/21/2020  2:12 PM 0.3 mL 12/03/2019 Intramuscular   Manufacturer: [place]   Lot: [id number]   [place]: [id number]

## 2020-02-25 ENCOUNTER — Other Ambulatory Visit: Payer: Self-pay | Admitting: Family Medicine

## 2020-02-25 DIAGNOSIS — M8580 Other specified disorders of bone density and structure, unspecified site: Secondary | ICD-10-CM

## 2020-02-25 DIAGNOSIS — Z1231 Encounter for screening mammogram for malignant neoplasm of breast: Secondary | ICD-10-CM

## 2020-03-28 ENCOUNTER — Other Ambulatory Visit: Payer: Self-pay | Admitting: Oncology

## 2020-04-12 ENCOUNTER — Encounter: Payer: Self-pay | Admitting: Hematology and Oncology

## 2020-04-19 ENCOUNTER — Telehealth: Payer: Self-pay

## 2020-04-19 NOTE — Telephone Encounter (Signed)
RN received labs from PCP for MD to review.  Labs showing neutropenia.   Per MD recommendations, bring patient in for follow up appointment.   RN left voicemail for patient to call back.

## 2020-04-20 ENCOUNTER — Telehealth: Payer: Self-pay

## 2020-04-20 NOTE — Telephone Encounter (Signed)
RN spoke with patient and gave MD recommendations to have follow up based on lab results from Dr. Drema Dallas.   Pt had several questions related to lab results, RN answered questions.  Pt declining follow up visit at this time.  RN notified MD.  RN encouraged patient to call us if she changes her mind.  RN notified PCP as well.

## 2020-05-08 ENCOUNTER — Ambulatory Visit
Admission: RE | Admit: 2020-05-08 | Discharge: 2020-05-08 | Disposition: A | Discharge status: Home / Self Care | Payer: Medicare Other | Source: Ambulatory Visit | Attending: Family Medicine | Admitting: Family Medicine

## 2020-05-08 ENCOUNTER — Other Ambulatory Visit: Payer: Self-pay

## 2020-05-08 DIAGNOSIS — M8580 Other specified disorders of bone density and structure, unspecified site: Secondary | ICD-10-CM

## 2020-05-08 DIAGNOSIS — Z1231 Encounter for screening mammogram for malignant neoplasm of breast: Secondary | ICD-10-CM

## 2021-01-29 DIAGNOSIS — Z85828 Personal history of other malignant neoplasm of skin: Secondary | ICD-10-CM | POA: Diagnosis not present

## 2021-01-29 DIAGNOSIS — L853 Xerosis cutis: Secondary | ICD-10-CM | POA: Diagnosis not present

## 2021-01-29 DIAGNOSIS — D2262 Melanocytic nevi of left upper limb, including shoulder: Secondary | ICD-10-CM | POA: Diagnosis not present

## 2021-01-29 DIAGNOSIS — L570 Actinic keratosis: Secondary | ICD-10-CM | POA: Diagnosis not present

## 2021-01-29 DIAGNOSIS — D2271 Melanocytic nevi of right lower limb, including hip: Secondary | ICD-10-CM | POA: Diagnosis not present

## 2021-01-29 DIAGNOSIS — D225 Melanocytic nevi of trunk: Secondary | ICD-10-CM | POA: Diagnosis not present

## 2021-01-29 DIAGNOSIS — L821 Other seborrheic keratosis: Secondary | ICD-10-CM | POA: Diagnosis not present

## 2021-01-29 DIAGNOSIS — L218 Other seborrheic dermatitis: Secondary | ICD-10-CM | POA: Diagnosis not present

## 2021-01-29 DIAGNOSIS — L738 Other specified follicular disorders: Secondary | ICD-10-CM | POA: Diagnosis not present

## 2021-02-28 DIAGNOSIS — H40033 Anatomical narrow angle, bilateral: Secondary | ICD-10-CM | POA: Diagnosis not present

## 2021-02-28 DIAGNOSIS — H25013 Cortical age-related cataract, bilateral: Secondary | ICD-10-CM | POA: Diagnosis not present

## 2021-02-28 DIAGNOSIS — H35363 Drusen (degenerative) of macula, bilateral: Secondary | ICD-10-CM | POA: Diagnosis not present

## 2021-02-28 DIAGNOSIS — H2513 Age-related nuclear cataract, bilateral: Secondary | ICD-10-CM | POA: Diagnosis not present

## 2021-04-11 DIAGNOSIS — M1712 Unilateral primary osteoarthritis, left knee: Secondary | ICD-10-CM | POA: Diagnosis not present

## 2021-04-11 DIAGNOSIS — M25562 Pain in left knee: Secondary | ICD-10-CM | POA: Diagnosis not present

## 2021-04-30 DIAGNOSIS — Z136 Encounter for screening for cardiovascular disorders: Secondary | ICD-10-CM | POA: Diagnosis not present

## 2021-04-30 DIAGNOSIS — Z1211 Encounter for screening for malignant neoplasm of colon: Secondary | ICD-10-CM | POA: Diagnosis not present

## 2021-04-30 DIAGNOSIS — R252 Cramp and spasm: Secondary | ICD-10-CM | POA: Diagnosis not present

## 2021-04-30 DIAGNOSIS — Z0000 Encounter for general adult medical examination without abnormal findings: Secondary | ICD-10-CM | POA: Diagnosis not present

## 2021-04-30 DIAGNOSIS — K589 Irritable bowel syndrome without diarrhea: Secondary | ICD-10-CM | POA: Diagnosis not present

## 2021-04-30 DIAGNOSIS — D709 Neutropenia, unspecified: Secondary | ICD-10-CM | POA: Diagnosis not present

## 2021-04-30 DIAGNOSIS — M8588 Other specified disorders of bone density and structure, other site: Secondary | ICD-10-CM | POA: Diagnosis not present

## 2021-07-05 DIAGNOSIS — N8111 Cystocele, midline: Secondary | ICD-10-CM | POA: Diagnosis not present

## 2021-07-20 DIAGNOSIS — K580 Irritable bowel syndrome with diarrhea: Secondary | ICD-10-CM | POA: Diagnosis not present

## 2021-07-31 ENCOUNTER — Other Ambulatory Visit: Payer: Self-pay | Admitting: Family Medicine

## 2021-07-31 DIAGNOSIS — Z1231 Encounter for screening mammogram for malignant neoplasm of breast: Secondary | ICD-10-CM

## 2021-08-06 ENCOUNTER — Other Ambulatory Visit: Payer: Self-pay

## 2021-08-06 ENCOUNTER — Ambulatory Visit
Admission: RE | Admit: 2021-08-06 | Discharge: 2021-08-06 | Disposition: A | Discharge status: Home / Self Care | Payer: Medicare Other | Source: Ambulatory Visit | Attending: Family Medicine | Admitting: Family Medicine

## 2021-08-06 DIAGNOSIS — Z1231 Encounter for screening mammogram for malignant neoplasm of breast: Secondary | ICD-10-CM | POA: Diagnosis not present

## 2021-10-24 DIAGNOSIS — K648 Other hemorrhoids: Secondary | ICD-10-CM | POA: Diagnosis not present

## 2021-10-24 DIAGNOSIS — Z1211 Encounter for screening for malignant neoplasm of colon: Secondary | ICD-10-CM | POA: Diagnosis not present

## 2021-10-24 LAB — HM COLONOSCOPY

## 2022-02-19 DIAGNOSIS — Z85828 Personal history of other malignant neoplasm of skin: Secondary | ICD-10-CM | POA: Diagnosis not present

## 2022-02-19 DIAGNOSIS — D2262 Melanocytic nevi of left upper limb, including shoulder: Secondary | ICD-10-CM | POA: Diagnosis not present

## 2022-02-19 DIAGNOSIS — L738 Other specified follicular disorders: Secondary | ICD-10-CM | POA: Diagnosis not present

## 2022-02-19 DIAGNOSIS — L814 Other melanin hyperpigmentation: Secondary | ICD-10-CM | POA: Diagnosis not present

## 2022-02-19 DIAGNOSIS — D2272 Melanocytic nevi of left lower limb, including hip: Secondary | ICD-10-CM | POA: Diagnosis not present

## 2022-02-19 DIAGNOSIS — L570 Actinic keratosis: Secondary | ICD-10-CM | POA: Diagnosis not present

## 2022-02-19 DIAGNOSIS — D2261 Melanocytic nevi of right upper limb, including shoulder: Secondary | ICD-10-CM | POA: Diagnosis not present

## 2022-02-19 DIAGNOSIS — L853 Xerosis cutis: Secondary | ICD-10-CM | POA: Diagnosis not present

## 2022-02-19 DIAGNOSIS — D485 Neoplasm of uncertain behavior of skin: Secondary | ICD-10-CM | POA: Diagnosis not present

## 2022-02-19 DIAGNOSIS — L218 Other seborrheic dermatitis: Secondary | ICD-10-CM | POA: Diagnosis not present

## 2022-02-19 DIAGNOSIS — L821 Other seborrheic keratosis: Secondary | ICD-10-CM | POA: Diagnosis not present

## 2022-02-19 DIAGNOSIS — L820 Inflamed seborrheic keratosis: Secondary | ICD-10-CM | POA: Diagnosis not present

## 2022-02-28 DIAGNOSIS — H40033 Anatomical narrow angle, bilateral: Secondary | ICD-10-CM | POA: Diagnosis not present

## 2022-02-28 DIAGNOSIS — H04123 Dry eye syndrome of bilateral lacrimal glands: Secondary | ICD-10-CM | POA: Diagnosis not present

## 2022-02-28 DIAGNOSIS — H35363 Drusen (degenerative) of macula, bilateral: Secondary | ICD-10-CM | POA: Diagnosis not present

## 2022-02-28 DIAGNOSIS — H524 Presbyopia: Secondary | ICD-10-CM | POA: Diagnosis not present

## 2022-02-28 DIAGNOSIS — H25813 Combined forms of age-related cataract, bilateral: Secondary | ICD-10-CM | POA: Diagnosis not present

## 2022-03-11 DIAGNOSIS — N8111 Cystocele, midline: Secondary | ICD-10-CM | POA: Diagnosis not present

## 2022-03-26 ENCOUNTER — Other Ambulatory Visit: Payer: Self-pay

## 2022-03-26 ENCOUNTER — Encounter (HOSPITAL_COMMUNITY): Payer: Self-pay

## 2022-03-26 ENCOUNTER — Emergency Department (HOSPITAL_COMMUNITY): Payer: Medicare Other

## 2022-03-26 ENCOUNTER — Inpatient Hospital Stay (HOSPITAL_COMMUNITY)
Admission: EM | Admit: 2022-03-26 | Discharge: 2022-04-01 | DRG: 522 | Disposition: A | Discharge status: Skilled Nursing Facility | Payer: Medicare Other | Attending: Internal Medicine | Admitting: Internal Medicine

## 2022-03-26 DIAGNOSIS — Z01818 Encounter for other preprocedural examination: Secondary | ICD-10-CM | POA: Diagnosis not present

## 2022-03-26 DIAGNOSIS — W010XXA Fall on same level from slipping, tripping and stumbling without subsequent striking against object, initial encounter: Secondary | ICD-10-CM | POA: Diagnosis present

## 2022-03-26 DIAGNOSIS — Y92821 Forest as the place of occurrence of the external cause: Secondary | ICD-10-CM | POA: Diagnosis not present

## 2022-03-26 DIAGNOSIS — S8780XA Crushing injury of unspecified lower leg, initial encounter: Secondary | ICD-10-CM | POA: Diagnosis not present

## 2022-03-26 DIAGNOSIS — S72002A Fracture of unspecified part of neck of left femur, initial encounter for closed fracture: Secondary | ICD-10-CM | POA: Diagnosis not present

## 2022-03-26 DIAGNOSIS — F0394 Unspecified dementia, unspecified severity, with anxiety: Secondary | ICD-10-CM | POA: Diagnosis present

## 2022-03-26 DIAGNOSIS — D509 Iron deficiency anemia, unspecified: Secondary | ICD-10-CM | POA: Diagnosis not present

## 2022-03-26 DIAGNOSIS — S72042A Displaced fracture of base of neck of left femur, initial encounter for closed fracture: Secondary | ICD-10-CM | POA: Diagnosis not present

## 2022-03-26 DIAGNOSIS — F418 Other specified anxiety disorders: Secondary | ICD-10-CM | POA: Diagnosis present

## 2022-03-26 DIAGNOSIS — M5126 Other intervertebral disc displacement, lumbar region: Secondary | ICD-10-CM | POA: Diagnosis present

## 2022-03-26 DIAGNOSIS — Z79899 Other long term (current) drug therapy: Secondary | ICD-10-CM

## 2022-03-26 DIAGNOSIS — M48061 Spinal stenosis, lumbar region without neurogenic claudication: Secondary | ICD-10-CM | POA: Diagnosis present

## 2022-03-26 DIAGNOSIS — Z833 Family history of diabetes mellitus: Secondary | ICD-10-CM

## 2022-03-26 DIAGNOSIS — F0393 Unspecified dementia, unspecified severity, with mood disturbance: Secondary | ICD-10-CM | POA: Diagnosis present

## 2022-03-26 DIAGNOSIS — I499 Cardiac arrhythmia, unspecified: Secondary | ICD-10-CM | POA: Diagnosis not present

## 2022-03-26 DIAGNOSIS — S72002D Fracture of unspecified part of neck of left femur, subsequent encounter for closed fracture with routine healing: Secondary | ICD-10-CM | POA: Diagnosis not present

## 2022-03-26 DIAGNOSIS — M25552 Pain in left hip: Secondary | ICD-10-CM | POA: Diagnosis not present

## 2022-03-26 DIAGNOSIS — Y9301 Activity, walking, marching and hiking: Secondary | ICD-10-CM | POA: Diagnosis present

## 2022-03-26 DIAGNOSIS — M25562 Pain in left knee: Secondary | ICD-10-CM | POA: Diagnosis present

## 2022-03-26 DIAGNOSIS — Z743 Need for continuous supervision: Secondary | ICD-10-CM | POA: Diagnosis not present

## 2022-03-26 DIAGNOSIS — G3184 Mild cognitive impairment, so stated: Secondary | ICD-10-CM | POA: Diagnosis not present

## 2022-03-26 DIAGNOSIS — G8918 Other acute postprocedural pain: Secondary | ICD-10-CM | POA: Diagnosis not present

## 2022-03-26 DIAGNOSIS — S72009A Fracture of unspecified part of neck of unspecified femur, initial encounter for closed fracture: Secondary | ICD-10-CM | POA: Diagnosis not present

## 2022-03-26 DIAGNOSIS — E871 Hypo-osmolality and hyponatremia: Secondary | ICD-10-CM | POA: Diagnosis not present

## 2022-03-26 DIAGNOSIS — W19XXXA Unspecified fall, initial encounter: Secondary | ICD-10-CM | POA: Diagnosis not present

## 2022-03-26 DIAGNOSIS — R6889 Other general symptoms and signs: Secondary | ICD-10-CM | POA: Diagnosis not present

## 2022-03-26 DIAGNOSIS — Z96642 Presence of left artificial hip joint: Secondary | ICD-10-CM | POA: Diagnosis not present

## 2022-03-26 DIAGNOSIS — M7989 Other specified soft tissue disorders: Secondary | ICD-10-CM | POA: Diagnosis not present

## 2022-03-26 DIAGNOSIS — Z471 Aftercare following joint replacement surgery: Secondary | ICD-10-CM | POA: Diagnosis not present

## 2022-03-26 DIAGNOSIS — Z7982 Long term (current) use of aspirin: Secondary | ICD-10-CM | POA: Diagnosis not present

## 2022-03-26 DIAGNOSIS — M545 Low back pain, unspecified: Secondary | ICD-10-CM | POA: Diagnosis not present

## 2022-03-26 DIAGNOSIS — M810 Age-related osteoporosis without current pathological fracture: Secondary | ICD-10-CM | POA: Diagnosis not present

## 2022-03-26 DIAGNOSIS — J449 Chronic obstructive pulmonary disease, unspecified: Secondary | ICD-10-CM | POA: Diagnosis not present

## 2022-03-26 DIAGNOSIS — K5900 Constipation, unspecified: Secondary | ICD-10-CM | POA: Diagnosis not present

## 2022-03-26 DIAGNOSIS — Z8781 Personal history of (healed) traumatic fracture: Secondary | ICD-10-CM | POA: Diagnosis present

## 2022-03-26 DIAGNOSIS — E162 Hypoglycemia, unspecified: Secondary | ICD-10-CM | POA: Diagnosis not present

## 2022-03-26 HISTORY — DX: Other specified anxiety disorders: F41.8

## 2022-03-26 HISTORY — DX: Mild cognitive impairment of uncertain or unknown etiology: G31.84

## 2022-03-26 LAB — CBC WITH DIFFERENTIAL/PLATELET
Abs Immature Granulocytes: 0.04 10*3/uL (ref 0.00–0.07)
Basophils Absolute: 0 10*3/uL (ref 0.0–0.1)
Basophils Relative: 0 %
Eosinophils Absolute: 0 10*3/uL (ref 0.0–0.5)
Eosinophils Relative: 0 %
HCT: 39 % (ref 36.0–46.0)
Hemoglobin: 13 g/dL (ref 12.0–15.0)
Immature Granulocytes: 0 %
Lymphocytes Relative: 5 %
Lymphs Abs: 0.5 10*3/uL — ABNORMAL LOW (ref 0.7–4.0)
MCH: 35.1 pg — ABNORMAL HIGH (ref 26.0–34.0)
MCHC: 33.3 g/dL (ref 30.0–36.0)
MCV: 105.4 fL — ABNORMAL HIGH (ref 80.0–100.0)
Monocytes Absolute: 0.4 10*3/uL (ref 0.1–1.0)
Monocytes Relative: 4 %
Neutro Abs: 9.3 10*3/uL — ABNORMAL HIGH (ref 1.7–7.7)
Neutrophils Relative %: 91 %
Platelets: 172 10*3/uL (ref 150–400)
RBC: 3.7 MIL/uL — ABNORMAL LOW (ref 3.87–5.11)
RDW: 12.1 % (ref 11.5–15.5)
WBC: 10.2 10*3/uL (ref 4.0–10.5)
nRBC: 0 % (ref 0.0–0.2)

## 2022-03-26 LAB — BASIC METABOLIC PANEL
Anion gap: 9 (ref 5–15)
BUN: 10 mg/dL (ref 8–23)
CO2: 25 mmol/L (ref 22–32)
Calcium: 8.7 mg/dL — ABNORMAL LOW (ref 8.9–10.3)
Chloride: 103 mmol/L (ref 98–111)
Creatinine, Ser: 0.88 mg/dL (ref 0.44–1.00)
GFR, Estimated: >60 mL/min (ref >60)
Glucose, Bld: 116 mg/dL — ABNORMAL HIGH (ref 70–99)
Potassium: 3.8 mmol/L (ref 3.5–5.1)
Sodium: 137 mmol/L (ref 135–145)

## 2022-03-26 MED ORDER — MORPHINE SULFATE (PF) 2 MG/ML IV SOLN
2.0000 mg | INTRAVENOUS | Status: DC | PRN
  Administered 2022-03-26 – 2022-03-28 (×7): 2 mg via INTRAVENOUS
  Filled 2022-03-26 (×8): qty 1

## 2022-03-26 MED ORDER — POLYETHYLENE GLYCOL 3350 17 G PO PACK: 17.0000 g | PACK | Freq: Every day | ORAL | Status: DC | PRN

## 2022-03-26 MED ORDER — DOCUSATE SODIUM 100 MG PO CAPS
100.0000 mg | ORAL_CAPSULE | Freq: Two times a day (BID) | ORAL | Status: DC
  Administered 2022-03-26 – 2022-04-01 (×11): 100 mg via ORAL
  Filled 2022-03-26 (×11): qty 1

## 2022-03-26 MED ORDER — HYDROMORPHONE HCL 1 MG/ML IJ SOLN
1.0000 mg | Freq: Once | INTRAMUSCULAR | Status: AC
  Administered 2022-03-26: 1 mg via INTRAVENOUS
  Filled 2022-03-26: qty 1

## 2022-03-26 MED ORDER — METHOCARBAMOL 1000 MG/10ML IJ SOLN: 500.0000 mg | Freq: Four times a day (QID) | INTRAVENOUS | Status: DC | PRN

## 2022-03-26 MED ORDER — CLONAZEPAM 0.5 MG PO TABS
0.5000 mg | ORAL_TABLET | Freq: Every day | ORAL | Status: DC
  Administered 2022-03-27 – 2022-03-28 (×2): 0.5 mg via ORAL
  Filled 2022-03-26 (×2): qty 1

## 2022-03-26 MED ORDER — PANTOPRAZOLE SODIUM 40 MG PO TBEC
40.0000 mg | DELAYED_RELEASE_TABLET | Freq: Every day | ORAL | Status: DC
Start: 2022-03-27 — End: 2022-04-01
  Administered 2022-03-27 – 2022-03-31 (×4): 40 mg via ORAL
  Filled 2022-03-26 (×6): qty 1

## 2022-03-26 MED ORDER — BUSPIRONE HCL 5 MG PO TABS
15.0000 mg | ORAL_TABLET | Freq: Every day | ORAL | Status: DC
  Administered 2022-03-28: 15 mg via ORAL
  Filled 2022-03-26: qty 1

## 2022-03-26 MED ORDER — MEMANTINE HCL 10 MG PO TABS
10.0000 mg | ORAL_TABLET | Freq: Two times a day (BID) | ORAL | Status: DC
  Administered 2022-03-27 – 2022-03-28 (×3): 10 mg via ORAL
  Filled 2022-03-26 (×5): qty 1

## 2022-03-26 MED ORDER — OXYCODONE HCL 5 MG PO TABS
5.0000 mg | ORAL_TABLET | ORAL | Status: DC | PRN
  Administered 2022-03-26 (×2): 5 mg via ORAL
  Administered 2022-03-27 – 2022-03-28 (×5): 10 mg via ORAL
  Administered 2022-03-30 – 2022-04-01 (×3): 5 mg via ORAL
  Filled 2022-03-26 (×4): qty 2
  Filled 2022-03-26 (×2): qty 1
  Filled 2022-03-26: qty 2
  Filled 2022-03-26: qty 1
  Filled 2022-03-26: qty 2
  Filled 2022-03-26 (×2): qty 1

## 2022-03-26 MED ORDER — DONEPEZIL HCL 10 MG PO TABS
10.0000 mg | ORAL_TABLET | Freq: Every day | ORAL | Status: DC
  Administered 2022-03-27: 10 mg via ORAL
  Filled 2022-03-26 (×3): qty 1

## 2022-03-26 MED ORDER — BISACODYL 5 MG PO TBEC: 5.0000 mg | DELAYED_RELEASE_TABLET | Freq: Every day | ORAL | Status: DC | PRN

## 2022-03-26 MED ORDER — METHOCARBAMOL 500 MG PO TABS
500.0000 mg | ORAL_TABLET | Freq: Four times a day (QID) | ORAL | Status: DC | PRN
  Administered 2022-03-26 – 2022-03-31 (×6): 500 mg via ORAL
  Filled 2022-03-26 (×6): qty 1

## 2022-03-26 NOTE — H&P (Signed)
?History and Physical  ? ? ?Patient: Mackenzie Green:[id number] DOB: 11-25-1946 ?DOA: 03/26/2022 ?DOS: the patient was seen and examined on 03/26/2022 ?PCP: Aretta Nip, MD  ?Patient coming from: Home - lives alone; NOK: Evelina Dun, [phone] ? ? ?Chief Complaint: Fall ? ?HPI: Mackenzie Green is a 76 y.o. female with medical history significant of dementia with mood disorder presenting with a fall.  She was in her usual state of health and was hiking with friends when she struck a root and fell.  She landed directly on her left hip.  She has no other injuries.   ? ? ? ?ER Course:  Hiking today, tripped and fell, hip fracture.  Ortho has seen, repair tomorrow.   ? ? ? ? ?Review of Systems: As mentioned in the history of present illness. All other systems reviewed and are negative. ?Past Medical History:  ?Diagnosis Date  ? Depression with anxiety   ? Low iron   ? Mild cognitive impairment with memory loss   ? Mitral valve prolapse   ? ?History reviewed. No pertinent surgical history. ?Social History:  reports that she has never smoked. She has never used smokeless tobacco. She reports current alcohol use. She reports that she does not use drugs. ? ?Allergies  ?Allergen Reactions  ? Dairy Aid [Tilactase]   ? ? ?Family History  ?Problem Relation Age of Onset  ? Diabetes Mother   ? Breast cancer Neg Hx   ? ? ?Prior to Admission medications   ?Medication Sig Start Date End Date Taking? Authorizing Provider  ?aspirin 325 MG tablet Take 325 mg by mouth daily. 02/19/22   [provider]  ?BIOTIN 5000 5 MG CAPS Take 5 mg by mouth daily. 02/19/22   [provider]  ?busPIRone (BUSPAR) 15 MG tablet Take 15 mg by mouth daily. 02/19/22   [provider]  ?clonazePAM (KLONOPIN) 0.5 MG tablet Take 0.5 mg by mouth daily. 02/19/22   [provider]  ?donepezil (ARICEPT) 10 MG tablet Take 10 mg by mouth daily. 02/19/22   [provider]  ?esomeprazole (NEXIUM) 40 MG capsule  Take 40 mg by mouth daily. 02/19/22   [provider]  ?estradiol (ESTRACE) 0.1 MG/GM vaginal cream Place 1 Applicatorful vaginally. 02/19/22   [provider]  ?folic acid (FOLVITE) 1 MG tablet Take 1 mg by mouth daily. 02/19/22   [provider]  ?memantine (NAMENDA) 10 MG tablet Take 10 mg by mouth 2 (two) times daily. 02/19/22   [provider]  ?Triamcinolone Acetonide 0.025 % LOTN Apply 1 application. topically daily. 02/19/22   [provider]  ? ? ?Physical Exam: ?Vitals:  ? 03/26/22 1212 03/26/22 1217 03/26/22 1445  ?BP: (!) 143/72  140/70  ?Pulse: 67  68  ?Resp: 16  16  ?Temp: 99.1 ?F (37.3 ?C)    ?TempSrc: Oral    ?SpO2: 100%  100%  ?Weight:  45.8 kg   ?Height:  '5\' 4"'$  (1.626 m)   ? ?General:  Appears calm and comfortable and is in NAD; hip pain with movement ?Eyes:  EOMI, normal lids, iris ?ENT:  grossly normal lips & tongue, mmm; appropriate dentition ?Neck:  no LAD, masses or thyromegaly ?Cardiovascular:  RRR, no m/r/g. No LE edema.  ?Respiratory:   CTA bilaterally with no wheezes/rales/rhonchi.  Normal respiratory effort.   ?Abdomen:  soft, NT, ND ?Skin:  no rash or induration seen on limited exam ?Musculoskeletal:  L leg is shortened, no obvious external  L hip deformity ?Psychiatric:  flat mood and affect, speech fluent and appropriate, A&O x 3 ?Neurologic:  CN 2-12 grossly intact, moves all extremities in coordinated fashion other than LLE ? ? ?Radiological Exams on Admission: ?Independently reviewed - see discussion in A/P where applicable ? ?DG Chest 1 View ? ?Result Date: 03/26/2022 ?CLINICAL DATA:  Left hip fracture.  Pre-op clearance exam EXAM: CHEST  1 VIEW COMPARISON:  07/13/2018 FINDINGS: The heart size and mediastinal contours are within normal limits. Pulmonary hyperinflation again seen, consistent with COPD. Small calcified granuloma again noted in the left lower lung. Both lungs are otherwise clear. The visualized skeletal structures are unremarkable.  IMPRESSION: COPD. No active disease. Electronically Signed   By: Marlaine Hind M.D.   On: 03/26/2022 13:00  ? ?CT LUMBAR SPINE WO CONTRAST ? ?Result Date: 03/26/2022 ?CLINICAL DATA:  Low back pain. EXAM: CT LUMBAR SPINE WITHOUT CONTRAST TECHNIQUE: Multidetector CT imaging of the lumbar spine was performed without intravenous contrast administration. Multiplanar CT image reconstructions were also generated. RADIATION DOSE REDUCTION: This exam was performed according to the departmental dose-optimization program which includes automated exposure control, adjustment of the mA and/or kV according to patient size and/or use of iterative reconstruction technique. COMPARISON:  MRI 03/13/2005 FINDINGS: Segmentation: There are five lumbar type vertebral bodies. The last full intervertebral disc space is labeled L5-S1. Alignment: Normal Vertebrae: Moderate osteoporosis but no bone lesion or fracture. Facets are normally aligned. No pars defects. Paraspinal and other soft tissues: No significant paraspinal or retroperitoneal findings. Scattered aortic calcifications but no aneurysm. Disc levels: Mild bulging discs but no focal disc protrusions, significant spinal at T12-L1,L1-2, L2-3 or L3-4. L4-5: Broad-based disc protrusion asymmetric left. This in combination with facet disease and ligamentum flavum thickening creates moderate spinal and bilateral lateral recess stenosis. There is also a left foraminal component which appears to contact and slightly displace the left L4 nerve root. Recommend correlation with left L4 radiculopathy. L5-S1: Mild annular bulge with mild impression on the ventral thecal sac but no significant spinal foraminal stenosis. 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 There is also a left foraminal component which appears to contact and slightly displace the left L4 nerve root. Recommend correlation with left L4  radiculopathy. No acute bony findings or bone lesions. Aortic Atherosclerosis (ICD10-I70.0). Electronically Signed   By: Marijo Sanes M.D.   On: 03/26/2022 15:10  ? ?DG Knee Complete 4 Views Left ? ?Result Date: 03/26/2022 ?CLINICAL DATA:  Fall wall hiking today.  Left knee pain. EXAM: LEFT KNEE - COMPLETE 4+ VIEW COMPARISON:  None. FINDINGS: No evidence of fracture, dislocation, or joint effusion. No evidence of arthropathy or other focal bone abnormality. Peripheral vascular calcification noted in the upper left leg. IMPRESSION: No acute findings. Electronically Signed   By: Marlaine Hind M.D.   On: 03/26/2022 15:29  ? ?DG Hip Unilat With Pelvis 2-3 Views Left ? ?Result Date: 03/26/2022 ?CLINICAL DATA:  Fall while hiking.  Left hip pain. EXAM: DG HIP (WITH OR WITHOUT PELVIS) 2-3V LEFT COMPARISON:  None. FINDINGS: Basicervical left femoral neck fracture is seen which is displaced. No evidence of dislocation. No pelvic fracture identified. IMPRESSION: Displaced left femoral neck fracture. Electronically Signed   By: Marlaine Hind M.D.   On: 03/26/2022 12:59   ? ?EKG: not done ? ? ?Labs on Admission: I have personally reviewed the available labs and imaging studies at the time of the admission. ? ?Pertinent  labs:   ? ?Glucose 116 ?Unremarkable CBC ? ? ? ?Assessment and Plan: ?No notes have been filed under this hospital service. ?Service: Hospitalist ? ? ? ? ? ?Advance Care Planning:   Code Status: Full Code - she was previously DNR but is uncertain and so wishes to revoke the DNR for now ? ?Consults: Orthopedics; SW, Nutrition; will need PT post-operatively  ? ?DVT Prophylaxis: SCDs until cleared by orthopedics for Lovenox ? ?Family Communication: Son was present throughout evaluation ? ?Severity of Illness: ?The appropriate patient status for this patient is INPATIENT. Inpatient status is judged to be reasonable and necessary in order to provide the required intensity of service to ensure the patient's safety. The  patient's presenting symptoms, physical exam findings, and initial radiographic and laboratory data in the context of their chronic comorbidities is felt to place them at high risk for further clinical dete

## 2022-03-26 NOTE — Consult Note (Signed)
Reason for Consult:Left hip fx ?Referring Physician: Myrtie Cruise ?Time called: 1307 ?Time at bedside: 1338 ? ? ?Mackenzie Green is an 76 y.o. female.  ?HPI: Mackenzie Green was hiking today when she tripped over a root and fell. She had immediate left hip pain and could not get up or bear weight. She was brought to the ED where x-rays showed a left hip fx and orthopedic surgery was consulted. She does not work and lives alone. ? ?Past Medical History:  ?Diagnosis Date  ? Low iron   ? Mitral valve prolapse   ? ? ?History reviewed. No pertinent surgical history. ? ?Family History  ?Problem Relation Age of Onset  ? Diabetes Mother   ? Breast cancer Neg Hx   ? ? ?Social History:  reports that she has never smoked. She has never used smokeless tobacco. She reports that she does not drink alcohol and does not use drugs. ? ?Allergies:  ?Allergies  ?Allergen Reactions  ? Dairy Aid [Tilactase]   ? ? ?Medications: I have reviewed the patient's current medications. ? ?No results found for this or any previous visit (from the past 48 hour(s)). ? ?DG Chest 1 View ? ?Result Date: 03/26/2022 ?CLINICAL DATA:  Left hip fracture.  Pre-op clearance exam EXAM: CHEST  1 VIEW COMPARISON:  07/13/2018 FINDINGS: The heart size and mediastinal contours are within normal limits. Pulmonary hyperinflation again seen, consistent with COPD. Small calcified granuloma again noted in the left lower lung. Both lungs are otherwise clear. The visualized skeletal structures are unremarkable. IMPRESSION: COPD. No active disease. Electronically Signed   By: Marlaine Hind M.D.   On: 03/26/2022 13:00  ? ?DG Hip Unilat With Pelvis 2-3 Views Left ? ?Result Date: 03/26/2022 ?CLINICAL DATA:  Fall while hiking.  Left hip pain. EXAM: DG HIP (WITH OR WITHOUT PELVIS) 2-3V LEFT COMPARISON:  None. FINDINGS: Basicervical left femoral neck fracture is seen which is displaced. No evidence of dislocation. No pelvic fracture identified. IMPRESSION: Displaced left femoral neck fracture.  Electronically Signed   By: Marlaine Hind M.D.   On: 03/26/2022 12:59   ? ?Review of Systems  ?HENT:  Negative for ear discharge, ear pain, hearing loss and tinnitus.   ?Eyes:  Negative for photophobia and pain.  ?Respiratory:  Negative for cough and shortness of breath.   ?Cardiovascular:  Negative for chest pain.  ?Gastrointestinal:  Negative for abdominal pain, nausea and vomiting.  ?Genitourinary:  Negative for dysuria, flank pain, frequency and urgency.  ?Musculoskeletal:  Positive for arthralgias (Left hip/knee) and back pain (Lumbar). Negative for myalgias and neck pain.  ?Neurological:  Negative for dizziness and headaches.  ?Hematological:  Does not bruise/bleed easily.  ?Psychiatric/Behavioral:  The patient is not nervous/anxious.   ?Blood pressure (!) 143/72, pulse 67, temperature 99.1 ?F (37.3 ?C), temperature source Oral, resp. rate 16, height '5\' 4"'$  (1.626 m), weight 45.8 kg, SpO2 100 %. ?Physical Exam ?Constitutional:   ?   General: She is not in acute distress. ?   Appearance: She is well-developed. She is not diaphoretic.  ?HENT:  ?   Head: Normocephalic and atraumatic.  ?Eyes:  ?   General: No scleral icterus.    ?   Right eye: No discharge.     ?   Left eye: No discharge.  ?   Conjunctiva/sclera: Conjunctivae normal.  ?Cardiovascular:  ?   Rate and Rhythm: Normal rate and regular rhythm.  ?Pulmonary:  ?   Effort: Pulmonary effort is normal. No respiratory distress.  ?Musculoskeletal:  ?  Cervical back: Normal range of motion.  ?   Comments: LLE No traumatic wounds, ecchymosis, or rash ? Mild TTP hip/lateral knee ? No knee or ankle effusion ? Knee stable to varus/ valgus and anterior/posterior stress ? Sens DPN, SPN, TN intact ? Motor EHL, ext, flex, evers 5/5 ? DP 2+, PT 2+, No significant edema  ?Skin: ?   General: Skin is warm and dry.  ?Neurological:  ?   Mental Status: She is alert.  ?Psychiatric:     ?   Mood and Affect: Mood normal.     ?   Behavior: Behavior normal.   ? ? ?Assessment/Plan: ?Left hip fx -- Plan THA tomorrow with Dr. Zachery Dakins. NPO after MN. ?Left knee pain -- Check x-ray ?Lumbar pain -- Check lumbar CT ? ? ? ?Lisette Abu, PA-C ?Orthopedic Surgery ?[phone] ?03/26/2022, 2:21 PM  ?

## 2022-03-26 NOTE — ED Notes (Signed)
Ambulatory to b/r with IV pole, steady gait, alert, NAD, calm, interactive.  ?

## 2022-03-26 NOTE — ED Notes (Signed)
Pts son Larkin Ina would like to be contacted if the pt is moved upstairs.  ?

## 2022-03-26 NOTE — ED Provider Notes (Signed)
?[facility] ?Provider Note ? ? ?CSN: [id number] ?Arrival date & time: 03/26/22  1205 ? ?  ? ?History ? ?Chief Complaint  ?Patient presents with  ? Fall  ? Hip Injury  ? ? ?Mackenzie Green is a 76 y.o. female presented today for evaluation of left hip injury.  Patient was hiking with her friends in [place] today when she tripped over a root landing directly onto the left hip she had immediate severe sharp left hip pain nonradiating worsened with movement and palpation.  EMS was called, patient has had some relief of pain following fentanyl by EMS.  Patient denies head injury/loss conscious, headache, neck pain, back pain, chest pain, abdominal pain or any additional injuries or concerns today.  Patient reports she lives at home alone and performs ADLs without assistance. ? ?HPI ? ?  ? ?Home Medications ?Prior to Admission medications   ?Medication Sig Start Date End Date Taking? Authorizing Provider  ?aspirin 325 MG tablet Take 325 mg by mouth daily. 02/19/22   [provider]  ?BIOTIN 5000 5 MG CAPS Take 5 mg by mouth daily. 02/19/22   [provider]  ?busPIRone (BUSPAR) 15 MG tablet Take 15 mg by mouth daily. 02/19/22   [provider]  ?clonazePAM (KLONOPIN) 0.5 MG tablet Take 0.5 mg by mouth daily. 02/19/22   [provider]  ?donepezil (ARICEPT) 10 MG tablet Take 10 mg by mouth daily. 02/19/22   [provider]  ?esomeprazole (NEXIUM) 40 MG capsule Take 40 mg by mouth daily. 02/19/22   [provider]  ?estradiol (ESTRACE) 0.1 MG/GM vaginal cream Place 1 Applicatorful vaginally. 02/19/22   [provider]  ?folic acid (FOLVITE) 1 MG tablet Take 1 mg by mouth daily. 02/19/22   [provider]  ?memantine (NAMENDA) 10 MG tablet Take 10 mg by mouth 2 (two) times daily. 02/19/22   [provider]  ?Triamcinolone Acetonide 0.025 % LOTN Apply 1 application. topically daily. 02/19/22   [provider]   ?   ? ?Allergies    ?Dairy aid [tilactase]   ? ?Review of Systems   ?Review of Systems Ten systems are reviewed and are negative for acute change except as noted in the HPI ? ?Physical Exam ?Updated Vital Signs ?BP 140/70 (BP Location: Right Arm)   Pulse 68   Temp 99.1 ?F (37.3 ?C) (Oral)   Resp 16   Ht '5\' 4"'$  (1.626 m)   Wt 45.8 kg   SpO2 100%   BMI 17.34 kg/m?  ?Physical Exam ?Constitutional:   ?   General: She is not in acute distress. ?   Appearance: Normal appearance. She is well-developed. She is not ill-appearing or diaphoretic.  ?HENT:  ?   Head: Normocephalic and atraumatic.  ?Eyes:  ?   General: Vision grossly intact. Gaze aligned appropriately.  ?   Pupils: Pupils are equal, round, and reactive to light.  ?Neck:  ?   Trachea: Trachea and phonation normal.  ?Cardiovascular:  ?   Rate and Rhythm: Normal rate and regular rhythm.  ?Pulmonary:  ?   Effort: Pulmonary effort is normal. No respiratory distress.  ?Abdominal:  ?   General: There is no distension.  ?   Palpations: Abdomen is soft.  ?   Tenderness: There is no abdominal tenderness. There is no guarding or rebound.  ?Musculoskeletal:  ?   Cervical back: Normal range of motion.  ?   Comments: Left hip: No skin break.  Shortening and external rotation of the left leg.  Tenderness palpation of the left hip.  Decreased ROM due to pain and deformity.  Capillary refill and sensation intact distally.  Strong equal pedal pulses.  Compartments soft.   ? ?No tenderness palpation of the bilateral upper extremities or right lower extremity.  No tenderness at the left knee, lower leg or ankle/foot.  ?Skin: ?   General: Skin is warm and dry.  ?Neurological:  ?   Mental Status: She is alert.  ?   GCS: GCS eye subscore is 4. GCS verbal subscore is 5. GCS motor subscore is 6.  ?   Comments: Speech is clear and goal oriented, follows commands ?Major Cranial nerves without deficit, no facial droop ?Moves extremities without ataxia, coordination intact   ?Psychiatric:     ?   Behavior: Behavior normal.  ? ? ?ED Results / Procedures / Treatments   ?Labs ?(all labs ordered are listed, but only abnormal results are displayed) ?Labs Reviewed  ?CBC WITH DIFFERENTIAL/PLATELET - Abnormal; Notable for the following components:  ?    Result Value  ? RBC 3.70 (*)   ? MCV 105.4 (*)   ? MCH 35.1 (*)   ? Neutro Abs 9.3 (*)   ? Lymphs Abs 0.5 (*)   ? All other components within normal limits  ?BASIC METABOLIC PANEL - Abnormal; Notable for the following components:  ? Glucose, Bld 116 (*)   ? Calcium 8.7 (*)   ? All other components within normal limits  ? ? ?EKG ?None ? ?Radiology ?DG Chest 1 View ? ?Result Date: 03/26/2022 ?CLINICAL DATA:  Left hip fracture.  Pre-op clearance exam EXAM: CHEST  1 VIEW COMPARISON:  07/13/2018 FINDINGS: The heart size and mediastinal contours are within normal limits. Pulmonary hyperinflation again seen, consistent with COPD. Small calcified granuloma again noted in the left lower lung. Both lungs are otherwise clear. The visualized skeletal structures are unremarkable. IMPRESSION: COPD. No active disease. Electronically Signed   By: Marlaine Hind M.D.   On: 03/26/2022 13:00  ? ?CT LUMBAR SPINE WO CONTRAST ? ?Result Date: 03/26/2022 ?CLINICAL DATA:  Low back pain. EXAM: CT LUMBAR SPINE WITHOUT CONTRAST TECHNIQUE: Multidetector CT imaging of the lumbar spine was performed without intravenous contrast administration. Multiplanar CT image reconstructions were also generated. RADIATION DOSE REDUCTION: This exam was performed according to the departmental dose-optimization program which includes automated exposure control, adjustment of the mA and/or kV according to patient size and/or use of iterative reconstruction technique. COMPARISON:  MRI 03/13/2005 FINDINGS: Segmentation: There are five lumbar type vertebral bodies. The last full intervertebral disc space is labeled L5-S1. Alignment: Normal Vertebrae: Moderate osteoporosis but no bone lesion or  fracture. Facets are normally aligned. No pars defects. Paraspinal and other soft tissues: No significant paraspinal or retroperitoneal findings. Scattered aortic calcifications but no aneurysm. Disc levels: Mild bulging discs but no focal disc protrusions, significant spinal at T12-L1,L1-2, L2-3 or L3-4. L4-5: Broad-based disc protrusion asymmetric left. This in combination with facet disease and ligamentum flavum thickening creates moderate spinal and bilateral lateral recess stenosis. There is also a left foraminal component which appears to contact and slightly displace the left L4 nerve root. Recommend correlation with left L4 radiculopathy. L5-S1: Mild annular bulge with mild impression on the ventral thecal sac but no significant spinal foraminal stenosis. 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 There is also a left foraminal component which appears to contact  and slightly displace the left L4 nerve root. Recommend correlation with left L4 radiculopathy. No acute bony findings or bone lesions. Aortic Atherosclerosis (ICD10-I70.0). Electronically Signed   By: Marijo Sanes M.D.   On: 03/26/2022 15:10  ? ?DG Hip Unilat With Pelvis 2-3 Views Left ? ?Result Date: 03/26/2022 ?CLINICAL DATA:  Fall while hiking.  Left hip pain. EXAM: DG HIP (WITH OR WITHOUT PELVIS) 2-3V LEFT COMPARISON:  None. FINDINGS: Basicervical left femoral neck fracture is seen which is displaced. No evidence of dislocation. No pelvic fracture identified. IMPRESSION: Displaced left femoral neck fracture. Electronically Signed   By: Marlaine Hind M.D.   On: 03/26/2022 12:59   ? ?Procedures ?Procedures  ? ? ?Medications Ordered in ED ?Medications  ?HYDROmorphone (DILAUDID) injection 1 mg (1 mg Intravenous Given 03/26/22 1333)  ? ? ?ED Course/ Medical Decision Making/ A&P ?Clinical Course as of 03/26/22 1531  ?Tue Mar 26, 2022  ?1319 This is a 76 year old  female presenting to the ED with a mechanical fall at home, found to have a left femoral neck fracture which is closed.  She is not on blood thinners.  She lives independently, is confirmed by her son at the bedside.  She is

## 2022-03-26 NOTE — ED Notes (Signed)
Pharm tech at [facility] ?

## 2022-03-26 NOTE — ED Triage Notes (Signed)
Pt BIB [facility] c/o a fall. Pt was out hiking in the woods when she tripped over a root in the ground and fell onto her left side. Shortening and rotation noted on left leg. Pt denies Loc or hitting her head. Pt denies being on a thinner.  ? ?22 right forearm ?150 mcg of fentanyl  ?

## 2022-03-26 NOTE — ED Notes (Signed)
Ortho PA at bedside.  

## 2022-03-26 NOTE — ED Notes (Signed)
Pt alert, NAD, calm, interactive.  ?

## 2022-03-26 NOTE — ED Notes (Signed)
Patient transported to CT 

## 2022-03-26 NOTE — H&P (View-Only) (Signed)
Reason for Consult:Left hip fx ?Referring Physician: Myrtie Cruise ?Time called: 1307 ?Time at bedside: 1338 ? ? ?Mackenzie Green is an 76 y.o. female.  ?HPI: Mackenzie Green was hiking today when she tripped over a root and fell. She had immediate left hip pain and could not get up or bear weight. She was brought to the ED where x-rays showed a left hip fx and orthopedic surgery was consulted. She does not work and lives alone. ? ?Past Medical History:  ?Diagnosis Date  ? Low iron   ? Mitral valve prolapse   ? ? ?History reviewed. No pertinent surgical history. ? ?Family History  ?Problem Relation Age of Onset  ? Diabetes Mother   ? Breast cancer Neg Hx   ? ? ?Social History:  reports that she has never smoked. She has never used smokeless tobacco. She reports that she does not drink alcohol and does not use drugs. ? ?Allergies:  ?Allergies  ?Allergen Reactions  ? Dairy Aid [Tilactase]   ? ? ?Medications: I have reviewed the patient's current medications. ? ?No results found for this or any previous visit (from the past 48 hour(s)). ? ?DG Chest 1 View ? ?Result Date: 03/26/2022 ?CLINICAL DATA:  Left hip fracture.  Pre-op clearance exam EXAM: CHEST  1 VIEW COMPARISON:  07/13/2018 FINDINGS: The heart size and mediastinal contours are within normal limits. Pulmonary hyperinflation again seen, consistent with COPD. Small calcified granuloma again noted in the left lower lung. Both lungs are otherwise clear. The visualized skeletal structures are unremarkable. IMPRESSION: COPD. No active disease. Electronically Signed   By: Marlaine Hind M.D.   On: 03/26/2022 13:00  ? ?DG Hip Unilat With Pelvis 2-3 Views Left ? ?Result Date: 03/26/2022 ?CLINICAL DATA:  Fall while hiking.  Left hip pain. EXAM: DG HIP (WITH OR WITHOUT PELVIS) 2-3V LEFT COMPARISON:  None. FINDINGS: Basicervical left femoral neck fracture is seen which is displaced. No evidence of dislocation. No pelvic fracture identified. IMPRESSION: Displaced left femoral neck fracture.  Electronically Signed   By: Marlaine Hind M.D.   On: 03/26/2022 12:59   ? ?Review of Systems  ?HENT:  Negative for ear discharge, ear pain, hearing loss and tinnitus.   ?Eyes:  Negative for photophobia and pain.  ?Respiratory:  Negative for cough and shortness of breath.   ?Cardiovascular:  Negative for chest pain.  ?Gastrointestinal:  Negative for abdominal pain, nausea and vomiting.  ?Genitourinary:  Negative for dysuria, flank pain, frequency and urgency.  ?Musculoskeletal:  Positive for arthralgias (Left hip/knee) and back pain (Lumbar). Negative for myalgias and neck pain.  ?Neurological:  Negative for dizziness and headaches.  ?Hematological:  Does not bruise/bleed easily.  ?Psychiatric/Behavioral:  The patient is not nervous/anxious.   ?Blood pressure (!) 143/72, pulse 67, temperature 99.1 ?F (37.3 ?C), temperature source Oral, resp. rate 16, height '5\' 4"'$  (1.626 m), weight 45.8 kg, SpO2 100 %. ?Physical Exam ?Constitutional:   ?   General: She is not in acute distress. ?   Appearance: She is well-developed. She is not diaphoretic.  ?HENT:  ?   Head: Normocephalic and atraumatic.  ?Eyes:  ?   General: No scleral icterus.    ?   Right eye: No discharge.     ?   Left eye: No discharge.  ?   Conjunctiva/sclera: Conjunctivae normal.  ?Cardiovascular:  ?   Rate and Rhythm: Normal rate and regular rhythm.  ?Pulmonary:  ?   Effort: Pulmonary effort is normal. No respiratory distress.  ?Musculoskeletal:  ?  Cervical back: Normal range of motion.  ?   Comments: LLE No traumatic wounds, ecchymosis, or rash ? Mild TTP hip/lateral knee ? No knee or ankle effusion ? Knee stable to varus/ valgus and anterior/posterior stress ? Sens DPN, SPN, TN intact ? Motor EHL, ext, flex, evers 5/5 ? DP 2+, PT 2+, No significant edema  ?Skin: ?   General: Skin is warm and dry.  ?Neurological:  ?   Mental Status: She is alert.  ?Psychiatric:     ?   Mood and Affect: Mood normal.     ?   Behavior: Behavior normal.   ? ? ?Assessment/Plan: ?Left hip fx -- Plan THA tomorrow with Dr. Zachery Dakins. NPO after MN. ?Left knee pain -- Check x-ray ?Lumbar pain -- Check lumbar CT ? ? ? ?Lisette Abu, PA-C ?Orthopedic Surgery ?[phone] ?03/26/2022, 2:21 PM  ?

## 2022-03-26 NOTE — Assessment & Plan Note (Signed)
-  CT shows moderate spinal and L lateral recess stenosis at L4-5 with a left foraminal component ?-Will defer to orthopedics to decide whether inpatient neurosurgery consult is needed or if this can be deferred to outpatient setting depending on symptoms ?

## 2022-03-26 NOTE — Assessment & Plan Note (Signed)
-  Patient takes both Aricept and Namenda, will continue ?

## 2022-03-26 NOTE — Assessment & Plan Note (Addendum)
-  Mechanical fall resulting in hip fracture ?-Orthopedics consulted ?-NPO after midnight in anticipation of surgical repair tomorrow ?-SCDs overnight, start Lovenox post-operatively (or as per ortho) ?-Pain control with Tylenol, Robaxin, Oxycodone, and Morphine prn ?-TOC team consult for rehab placement ?-Will need PT consult post-operatively ?-Hip fracture order set utilized ?-TXA per orthopedics ?-Fascia iliacus block ordered per anesthesia ? ?Pre-operative stratification ?-Orthopedic/spinal surgery is associated with an intermediate (1-5%) cardiovascular risk for cardiac death and nonfatal MI ?-She has no known ASCVD RF and should not need further evaluation prior to surgery ?

## 2022-03-26 NOTE — Assessment & Plan Note (Signed)
-  Continue Buspar, Klonopin ?

## 2022-03-27 ENCOUNTER — Inpatient Hospital Stay (HOSPITAL_COMMUNITY): Payer: Medicare Other

## 2022-03-27 ENCOUNTER — Inpatient Hospital Stay (HOSPITAL_COMMUNITY): Payer: Medicare Other | Admitting: Anesthesiology

## 2022-03-27 ENCOUNTER — Encounter (HOSPITAL_COMMUNITY)
Admission: EM | Disposition: A | Discharge status: Skilled Nursing Facility | Payer: Self-pay | Source: Home / Self Care | Attending: Internal Medicine

## 2022-03-27 ENCOUNTER — Encounter (HOSPITAL_COMMUNITY): Payer: Self-pay | Admitting: Internal Medicine

## 2022-03-27 DIAGNOSIS — S72002A Fracture of unspecified part of neck of left femur, initial encounter for closed fracture: Secondary | ICD-10-CM | POA: Diagnosis not present

## 2022-03-27 HISTORY — PX: TOTAL HIP ARTHROPLASTY: SHX124

## 2022-03-27 LAB — CBC
HCT: 37.2 % (ref 36.0–46.0)
Hemoglobin: 12.6 g/dL (ref 12.0–15.0)
MCH: 35.1 pg — ABNORMAL HIGH (ref 26.0–34.0)
MCHC: 33.9 g/dL (ref 30.0–36.0)
MCV: 103.6 fL — ABNORMAL HIGH (ref 80.0–100.0)
Platelets: 156 10*3/uL (ref 150–400)
RBC: 3.59 MIL/uL — ABNORMAL LOW (ref 3.87–5.11)
RDW: 12.3 % (ref 11.5–15.5)
WBC: 5.2 10*3/uL (ref 4.0–10.5)
nRBC: 0 % (ref 0.0–0.2)

## 2022-03-27 LAB — BASIC METABOLIC PANEL
Anion gap: 6 (ref 5–15)
BUN: 10 mg/dL (ref 8–23)
CO2: 26 mmol/L (ref 22–32)
Calcium: 8.3 mg/dL — ABNORMAL LOW (ref 8.9–10.3)
Chloride: 102 mmol/L (ref 98–111)
Creatinine, Ser: 0.77 mg/dL (ref 0.44–1.00)
GFR, Estimated: >60 mL/min (ref >60)
Glucose, Bld: 100 mg/dL — ABNORMAL HIGH (ref 70–99)
Potassium: 4 mmol/L (ref 3.5–5.1)
Sodium: 134 mmol/L — ABNORMAL LOW (ref 135–145)

## 2022-03-27 SURGERY — ARTHROPLASTY, HIP, TOTAL,POSTERIOR APPROACH
Anesthesia: Spinal | Site: Hip | Laterality: Left

## 2022-03-27 MED ORDER — FENTANYL CITRATE (PF) 250 MCG/5ML IJ SOLN
INTRAMUSCULAR | Status: DC | PRN
  Administered 2022-03-27: 50 ug via INTRAVENOUS

## 2022-03-27 MED ORDER — OXYCODONE HCL 5 MG/5ML PO SOLN: 5.0000 mg | Freq: Once | ORAL | Status: DC | PRN

## 2022-03-27 MED ORDER — BUPIVACAINE IN DEXTROSE 0.75-8.25 % IT SOLN: INTRATHECAL | Status: DC | PRN

## 2022-03-27 MED ORDER — PHENYLEPHRINE HCL-NACL 20-0.9 MG/250ML-% IV SOLN
INTRAVENOUS | Status: DC | PRN
  Administered 2022-03-27: 50 ug/min via INTRAVENOUS

## 2022-03-27 MED ORDER — BUPIVACAINE HCL (PF) 0.5 % IJ SOLN
INTRAMUSCULAR | Status: DC | PRN
  Administered 2022-03-27: 15 mL via PERINEURAL

## 2022-03-27 MED ORDER — BUPIVACAINE LIPOSOME 1.3 % IJ SUSP
INTRAMUSCULAR | Status: AC
  Filled 2022-03-27: qty 20

## 2022-03-27 MED ORDER — 0.9 % SODIUM CHLORIDE (POUR BTL) OPTIME
TOPICAL | Status: DC | PRN
  Administered 2022-03-27: 1000 mL

## 2022-03-27 MED ORDER — OXYCODONE HCL 5 MG PO TABS: 5.0000 mg | ORAL_TABLET | Freq: Once | ORAL | Status: DC | PRN

## 2022-03-27 MED ORDER — ACETAMINOPHEN 10 MG/ML IV SOLN: 1000.0000 mg | Freq: Once | INTRAVENOUS | Status: DC | PRN

## 2022-03-27 MED ORDER — CHLORHEXIDINE GLUCONATE 0.12 % MT SOLN: 15.0000 mL | Freq: Once | OROMUCOSAL | Status: AC

## 2022-03-27 MED ORDER — FENTANYL CITRATE (PF) 100 MCG/2ML IJ SOLN
INTRAMUSCULAR | Status: DC
Start: 2022-03-27 — End: 2022-03-27
  Filled 2022-03-27: qty 2

## 2022-03-27 MED ORDER — WATER FOR IRRIGATION, STERILE IR SOLN
Status: DC | PRN
  Administered 2022-03-27: 500 mL via SURGICAL_CAVITY

## 2022-03-27 MED ORDER — HYDROMORPHONE HCL 1 MG/ML IJ SOLN
0.2500 mg | INTRAMUSCULAR | Status: DC | PRN
  Administered 2022-03-27: 0.5 mg via INTRAVENOUS

## 2022-03-27 MED ORDER — LACTATED RINGERS IV SOLN: INTRAVENOUS | Status: DC

## 2022-03-27 MED ORDER — SODIUM CHLORIDE 0.9 % IR SOLN
Status: DC | PRN
  Administered 2022-03-27: 3000 mL

## 2022-03-27 MED ORDER — HYDROMORPHONE HCL 1 MG/ML IJ SOLN
INTRAMUSCULAR | Status: AC
  Filled 2022-03-27: qty 1

## 2022-03-27 MED ORDER — BUPIVACAINE LIPOSOME 1.3 % IJ SUSP
INTRAMUSCULAR | Status: DC | PRN
  Administered 2022-03-27: 20 mL

## 2022-03-27 MED ORDER — BUPIVACAINE IN DEXTROSE 0.75-8.25 % IT SOLN
INTRATHECAL | Status: DC | PRN
  Administered 2022-03-27: 1.6 mL via INTRATHECAL

## 2022-03-27 MED ORDER — PROPOFOL 10 MG/ML IV BOLUS
INTRAVENOUS | Status: DC | PRN
  Administered 2022-03-27: 20 mg via INTRAVENOUS

## 2022-03-27 MED ORDER — CHLORHEXIDINE GLUCONATE 0.12 % MT SOLN
OROMUCOSAL | Status: AC
  Administered 2022-03-27: 15 mL via OROMUCOSAL
  Filled 2022-03-27: qty 15

## 2022-03-27 MED ORDER — ONDANSETRON HCL 4 MG/2ML IJ SOLN: 4.0000 mg | Freq: Once | INTRAMUSCULAR | Status: DC | PRN

## 2022-03-27 MED ORDER — CEFAZOLIN SODIUM-DEXTROSE 2-4 GM/100ML-% IV SOLN: 2.0000 g | INTRAVENOUS | Status: DC

## 2022-03-27 MED ORDER — BUPIVACAINE HCL (PF) 0.25 % IJ SOLN
INTRAMUSCULAR | Status: DC | PRN
  Administered 2022-03-27: 10 mL via PERINEURAL

## 2022-03-27 MED ORDER — CEFAZOLIN SODIUM-DEXTROSE 2-4 GM/100ML-% IV SOLN
2.0000 g | INTRAVENOUS | Status: AC
  Administered 2022-03-27: 2 g via INTRAVENOUS
  Filled 2022-03-27: qty 100

## 2022-03-27 MED ORDER — TRANEXAMIC ACID-NACL 1000-0.7 MG/100ML-% IV SOLN
1000.0000 mg | INTRAVENOUS | Status: AC
  Administered 2022-03-27: 1000 mg via INTRAVENOUS
  Filled 2022-03-27: qty 100

## 2022-03-27 MED ORDER — SODIUM CHLORIDE (PF) 0.9 % IJ SOLN
INTRAMUSCULAR | Status: DC | PRN
  Administered 2022-03-27: 50 mL

## 2022-03-27 MED ORDER — CHLORHEXIDINE GLUCONATE 4 % EX LIQD: 60.0000 mL | Freq: Once | CUTANEOUS | Status: DC

## 2022-03-27 MED ORDER — SODIUM CHLORIDE (PF) 0.9 % IJ SOLN
INTRAMUSCULAR | Status: AC
  Filled 2022-03-27: qty 50

## 2022-03-27 MED ORDER — PROPOFOL 500 MG/50ML IV EMUL
INTRAVENOUS | Status: DC | PRN
  Administered 2022-03-27: 50 ug/kg/min via INTRAVENOUS

## 2022-03-27 MED ORDER — ORAL CARE MOUTH RINSE: 15.0000 mL | Freq: Once | OROMUCOSAL | Status: AC

## 2022-03-27 MED ORDER — POVIDONE-IODINE 10 % EX SWAB: 2.0000 "application " | Freq: Once | CUTANEOUS | Status: DC

## 2022-03-27 SURGICAL SUPPLY — 64 items
ADH SKN CLS APL DERMABOND .7 (GAUZE/BANDAGES/DRESSINGS) ×1
APL PRP STRL LF DISP 70% ISPRP (MISCELLANEOUS) ×2
BAG COUNTER SPONGE SURGICOUNT (BAG) IMPLANT
BAG DECANTER FOR FLEXI CONT (MISCELLANEOUS) ×2 IMPLANT
BAG SPNG CNTER NS LX DISP (BAG)
BLADE SAGITTAL 25.0X1.19X90 (BLADE) ×2 IMPLANT
CHLORAPREP W/TINT 26 (MISCELLANEOUS) ×4 IMPLANT
COVER SURGICAL LIGHT HANDLE (MISCELLANEOUS) ×2 IMPLANT
DECANTER SPIKE VIAL GLASS SM (MISCELLANEOUS) ×2 IMPLANT
DERMABOND ADVANCED (GAUZE/BANDAGES/DRESSINGS) ×1
DERMABOND ADVANCED .7 DNX12 (GAUZE/BANDAGES/DRESSINGS) ×1 IMPLANT
DRAPE HIP W/POCKET STRL (MISCELLANEOUS) ×2 IMPLANT
DRAPE INCISE IOBAN 66X45 STRL (DRAPES) ×3 IMPLANT
DRAPE INCISE IOBAN 85X60 (DRAPES) ×2 IMPLANT
DRAPE POUCH INSTRU U-SHP 10X18 (DRAPES) ×2 IMPLANT
DRAPE SURG 17X11 SM STRL (DRAPES) ×2 IMPLANT
DRAPE U-SHAPE 47X51 STRL (DRAPES) ×2 IMPLANT
DRESSING AQUACEL AG SP 3.5X10 (GAUZE/BANDAGES/DRESSINGS) ×1 IMPLANT
DRSG AQUACEL AG ADV 3.5X10 (GAUZE/BANDAGES/DRESSINGS) ×1 IMPLANT
DRSG AQUACEL AG SP 3.5X10 (GAUZE/BANDAGES/DRESSINGS) ×2
ELECT BLADE 4.0 EZ CLEAN MEGAD (MISCELLANEOUS) ×2
ELECT REM PT RETURN 15FT ADLT (MISCELLANEOUS) ×2 IMPLANT
ELECTRODE BLDE 4.0 EZ CLN MEGD (MISCELLANEOUS) ×1 IMPLANT
GLOVE SRG 8 PF TXTR STRL LF DI (GLOVE) ×1 IMPLANT
GLOVE SURG ENC TEXT LTX SZ8 (GLOVE) ×4 IMPLANT
GLOVE SURG UNDER POLY LF SZ8 (GLOVE) ×8
GOWN STRL REUS W/TWL XL LVL3 (GOWN DISPOSABLE) ×3 IMPLANT
HANDPIECE INTERPULSE COAX TIP (DISPOSABLE)
HEAD CERAMIC FEMORAL 36MM (Head) ×1 IMPLANT
HOOD PEEL AWAY FLYTE STAYCOOL (MISCELLANEOUS) ×7 IMPLANT
INSERT 0 DEGREE 36 (Miscellaneous) ×1 IMPLANT
KIT BASIN OR (CUSTOM PROCEDURE TRAY) ×2 IMPLANT
KIT TURNOVER KIT A (KITS) IMPLANT
MANIFOLD NEPTUNE II (INSTRUMENTS) ×2 IMPLANT
MARKER SKIN DUAL TIP RULER LAB (MISCELLANEOUS) ×2 IMPLANT
NDL 18GX1X1/2 (RX/OR ONLY) (NEEDLE) IMPLANT
NEEDLE 18GX1X1/2 (RX/OR ONLY) (NEEDLE) ×2 IMPLANT
NEEDLE HYPO 22GX1.5 SAFETY (NEEDLE) IMPLANT
NS IRRIG 1000ML POUR BTL (IV SOLUTION) ×2 IMPLANT
PACK TOTAL JOINT (CUSTOM PROCEDURE TRAY) ×2 IMPLANT
PROTECTOR NERVE ULNAR (MISCELLANEOUS) ×2 IMPLANT
RETRIEVER SUT HEWSON (MISCELLANEOUS) ×2 IMPLANT
SCREW HEX LP 6.5X20 (Screw) ×1 IMPLANT
SCREW HEX LP 6.5X35 (Screw) ×1 IMPLANT
SEALER BIPOLAR AQUA 6.0 (INSTRUMENTS) ×2 IMPLANT
SET HNDPC FAN SPRY TIP SCT (DISPOSABLE) IMPLANT
SHELL TRIDENT II CLUST 50 (Shell) ×1 IMPLANT
STEM ACCOLADE SZ 6 (Hips) ×1 IMPLANT
SUCTION FRAZIER HANDLE 10FR (MISCELLANEOUS) ×2
SUCTION TUBE FRAZIER 10FR DISP (MISCELLANEOUS) ×1 IMPLANT
SUT BONE WAX W31G (SUTURE) ×1 IMPLANT
SUT ETHIBOND 2 V 37 (SUTURE) ×4 IMPLANT
SUT MNCRL AB 3-0 PS2 18 (SUTURE) ×2 IMPLANT
SUT VIC AB 0 CT1 27 (SUTURE) ×2
SUT VIC AB 0 CT1 27XBRD ANBCTR (SUTURE) ×1 IMPLANT
SUT VIC AB 1 CT1 27 (SUTURE) ×2
SUT VIC AB 1 CT1 27XBRD ANBCTR (SUTURE) ×1 IMPLANT
SUT VIC AB 2-0 CT2 27 (SUTURE) ×4 IMPLANT
SYR 20ML LL LF (SYRINGE) ×4 IMPLANT
SYR TB 1ML LUER SLIP (SYRINGE) ×1 IMPLANT
TOWEL GREEN STERILE (TOWEL DISPOSABLE) ×2 IMPLANT
TRAY FOLEY MTR SLVR 16FR STAT (SET/KITS/TRAYS/PACK) ×2 IMPLANT
TUBE SUCT ARGYLE STRL (TUBING) ×2 IMPLANT
WATER STERILE IRR 1000ML POUR (IV SOLUTION) ×4 IMPLANT

## 2022-03-27 NOTE — Op Note (Signed)
03/27/2022 ? ?7:16 PM ? ?PATIENT:  Mackenzie Green  ? ?MRN: [id number] ? ?PRE-OPERATIVE DIAGNOSIS:  Left displaced femoral neck fracture ? ?POST-OPERATIVE DIAGNOSIS:  same ? ?PROCEDURE:  Procedure(s): ?LEFT POSTERIOR TOTAL HIP ARTHROPLASTY ? ?PREOPERATIVE INDICATIONS:   ? ?Mackenzie Green is an 76 y.o. female who has a diagnosis of left displaced femoral neck fracture and elected for surgical management after failing conservative treatment.  The risks benefits and alternatives were discussed with the patient including but not limited to the risks of nonoperative treatment, versus surgical intervention including infection, bleeding, nerve injury, periprosthetic fracture, the need for revision surgery, dislocation, leg length discrepancy, blood clots, cardiopulmonary complications, morbidity, mortality, among others, and they were willing to proceed.   ? ? ?OPERATIVE REPORT  ?   ?SURGEON:  Charlies Constable, MD ?   ?ASSISTANT:  Izola Price, RNFA, (Present throughout the entire procedure,  necessary for completion of procedure in a timely manner, assisting with retraction, instrumentation, and closure)  ?   ?ANESTHESIA:  spinal ? ?ESTIMATED BLOOD LOSS: 300cc ?   ?COMPLICATIONS:  None.  ?   ?UNIQUE ASPECTS OF THE CASE:  none ? ?COMPONENTS:   ?[employer] Trident two 3 mm, Accolade 2 size 6, 36+0 ceramic head, neutral polyethylene liner, 6.5 hex screws x2 ? ?   ?PROCEDURE IN DETAIL:  ?The patient was met in the holding area and  identified.  The appropriate hip was identified and marked at the operative site.  The patient was then transported to the OR  and  placed under anesthesia.  At that point, the patient was  placed in the lateral decubitus position with the operative side up and  secured to the operating room table  and all bony prominences padded. A subaxillary role was also placed. ?   ?The operative lower extremity was prepped from the iliac crest to the distal leg.  Sterile draping was performed.  Time out was  performed prior to incision.   ?   ?A routine posterolateral approach was utilized via sharp dissection  carried down to the subcutaneous tissue.  Gross bleeders were Bovie coagulated.  The iliotibial band was identified and incised along the length of the skin incision through the glute max fascia.  Charnley retractor was placed with care to protect the sciatic nerve posteriorly.  With the hip internally rotated, the piriformis tendon was identified and released from the femoral insertion and tagged with a #2 Ethibond.  A capsulotomy was then performed off the femoral insertion and also tagged with a #2 Ethibond.   ? ?The hip was then dislocated and the femoral head came out during dislocation. ?The femoral neck was exposed, and I resected the femoral neck to approximately 15 mm above the lesser trochanter. ?   ?I then exposed the deep acetabulum, cleared out any tissue including the ligamentum teres.  After adequate visualization, I excised the labrum.  I then started reaming with a 44 mm reamer, first medializing to the floor of the cotyloid fossa, and then in the position of the cup aiming towards the greater sciatic notch, matching the version of the transverse acetabular ligament and tucked under the anterior wall. I reamed up to 50 mm reamer with good bony bed preparation and a 50 mm cup was chosen.  The real cup was then impacted into place.  Appropriate version and inclination was confirmed clinically matching their bony anatomy, and also with the use of the jig.  I placed 2 screws in the posterior superior  quadrant to augment fixation. ? ?A neutral liner was placed and impacted. It was confirmed to be appropriately seated and the acetabular retractors were removed. ?   ?I then prepared the proximal femur using the box cutter, Charnley awl, and then sequentially broached starting with 0 up to a size 5.  Which had good rotational stability and was slightly subsided below the original femoral neck cut.  A  calcar planer was used to resect further femoral neck. ?I then assessed axial stability again with the broach and although then size 5 was rotationally stable at still seem to subside with the impaction.  I elected to go up to a size 6 broach.  This sat approximately 3 mm proud of the neck cut but had excellent rotational and axial stability.. ? ?A trial broach, neck, and head was utilized, and I reduced the hip and it was found to have excellent stability.  There was no impingement with full extension and 90 degrees external rotation.  The hip was stable at the position of sleep and with 90 degrees flexion and 90 degrees of internal rotation.  Leg lengths were also clinically assessed in the lateral position and felt to be equal. ? ?A final femoral prosthesis size 6 was selected. I then impacted the real femoral prosthesis into place.I again trialed and selected a + 0 ball. and I impacted the real head ball into place. The hip was then reduced and taken through a range of motion. There was no impingement with full extension and 90 degrees external rotation.  The hip was stable at the position of sleep and with 90 degrees flexion and 90 degrees of internal rotation. Leg lengths were  again assessed and felt to be restored. ? ?The posterior capsule was then closed with #2 Ethibond.  The piriformis was repaired through the base of the abductor tendon using a [place] suture passer. ? ?I then irrigated the hip copiously again with pulse lavage. Periarticular injection was then performed with Exparel.  Intraop flat plate xray was obtained and components were confirmed to be in good position without fracture. We repaired the fascia #1 barbed suture, followed by 0 vicryl for the subcutaneous fat.  Skin was closed with 2-0 Vicryl and 3-0 Monocryl.  Dermabond and Aquacel dressing were applied. The patient was then awakened and returned to PACU in stable and satisfactory condition.  Leg lengths in the supine position were  assessed and felt to be clinically equal. There were no complications. ? ?Post op recs: ?WB: WBAT LLE, posterior hip precautions x6 weeks ?Abx: ancef x23 hours post op ?Imaging: PACU pelvis Xray ?Dressing: Aquacell, keep intact until follow up ?DVT prophylaxis: Aspirin 81BID starting POD1 ?Follow up: 2 weeks after surgery for a wound check with Dr. Zachery Dakins at [facility].  ?Address: [address] [place], [place], [place] [address]  ?Office Phone: [phone] ? ? ?Charlies Constable, MD ?Orthopedic Surgeon ? ? ? ?  ?

## 2022-03-27 NOTE — Anesthesia Procedure Notes (Signed)
Anesthesia Regional Block: Peng block  ? ?Pre-Anesthetic Checklist: , timeout performed,  Correct Patient, Correct Site, Correct Laterality,  Correct Procedure, Correct Position, site marked,  Risks and benefits discussed,  Surgical consent,  Pre-op evaluation,  At surgeon's request and post-op pain management ? ?Laterality: Left and Lower ? ?Prep: chloraprep     ?  ?Needles:  ?Injection technique: Single-shot ? ?  ? ? ?Needle Length: 9cm  ?Needle Gauge: 22  ? ? ? ?Additional Needles: ?Arrow? StimuQuik? ECHO Echogenic Stimulating PNB Needle ? ?Procedures:,,,, ultrasound used (permanent image in chart),,    ?Narrative:  ?Start time: 03/27/2022 11:52 AM ?End time: 03/27/2022 11:59 AM ?Injection made incrementally with aspirations every 5 mL. ? ?Performed by: Personally  ?Anesthesiologist: Oleta Mouse, MD ? ? ? ? ?

## 2022-03-27 NOTE — Progress Notes (Signed)
?PROGRESS NOTE ? ?Mackenzie Green  HER:[id number] DOB: 13-Sep-1946 DOA: 03/26/2022 ?PCP: Aretta Nip, MD  ? ?Brief Narrative: ?Patient is a 76 year old female with no significant medical problems except for iron deficiency anemia, mild cognitive impairment who presented to the emergency department with complaint of left hip pain after she fell on her left hip while she was hiking.  X-ray of the left hip showed displaced left femoral neck fracture.  Orthopedics consulted, plan for total hip arthroplasty on 4/5. ? ?Assessment & Plan: ? ?Principal Problem: ?  Hip fracture ([place]) ?Active Problems: ?  Mild cognitive impairment with memory loss ?  Depression with anxiety ?  Spinal stenosis at L4-L5 level ? ? ?Left hip fracture: [place] on the left side while hiking.  No history of head injury. X-ray of the left hip showed displaced left femoral neck fracture.  Orthopedics consulted, plan for total hip arthroplasty on 4/5.  Continue supportive care, pain management.  DVT prophylaxis, PT/OT evaluation after surgery. ?Patient is able to ambulate normally on her usual days, lives alone ? ?Back pain: CT lumbar spine showed broad-based disc protrusion asymmetric left at L4-5 contributing to moderate spinal and bilateral lateral recess stenosis. T continue supportive care ,, pain management.  Follow-up with neurosurgery as an outpatient.   ? ? ?History of iron deficiency: Takes iron supplementation at home. ? ? ?  ?  ? ?DVT prophylaxis:SCDs Start: 03/26/22 1627 ? ? ?  Code Status: Full Code ? ?Family Communication: None at the bedside ? ?Patient status: Inpatient ? ?Patient is from : Home ? ?Anticipated discharge to: Home versus skilled nursing facility ? ?Estimated DC date: 1 to 2 days ? ? ?Consultants: Orthopedics ? ?Procedures: Plan for ORIF ? ?Antimicrobials:  ?Anti-infectives (From admission, onward)  ? ? Start     Dose/Rate Route Frequency Ordered Stop  ? 03/27/22 1430  ceFAZolin (ANCEF) IVPB 2g/100 mL premix       ? 2  g ?200 mL/hr over 30 Minutes Intravenous On call to O.R. 03/27/22 0036 03/28/22 0559  ? 03/27/22 0600  ceFAZolin (ANCEF) IVPB 2g/100 mL premix  Status:  Discontinued       ? 2 g ?200 mL/hr over 30 Minutes Intravenous On call to O.R. 03/27/22 0030 03/27/22 0036  ? ?  ? ? ?Subjective: ?Patient seen and examined at the bedside this morning.  Hemodynamically stable.  Overall comfortable during my evaluation.  Left hip pain well controlled, complains of pain in the lower back ? ?Objective: ?Vitals:  ? 03/27/22 0445 03/27/22 0545 03/27/22 8563 03/27/22 0721  ?BP: (!) 107/56 (!) 105/49 (!) 104/48 (!) 100/50  ?Pulse:  68 74 70  ?Resp:    14  ?Temp:      ?TempSrc:      ?SpO2:  97% 94% 97%  ?Weight:      ?Height:      ? ?No intake or output data in the 24 hours ending 03/27/22 0837 ?Filed Weights  ? 03/26/22 1217  ?Weight: 45.8 kg  ? ? ?Examination: ? ?General exam: Overall comfortable, not in distress ?HEENT: PERRL ?Respiratory system:  no wheezes or crackles  ?Cardiovascular system: S1 & S2 heard, RRR.  ?Gastrointestinal system: Abdomen is nondistended, soft and nontender. ?Central nervous system: Alert and oriented ?Extremities: No edema, no clubbing ,no cyanosis, tenderness of the left hip with decreased range of motion on the left lower extremity ?Skin: No rashes, no ulcers,no icterus   ? ? ?Data Reviewed: I have personally reviewed following labs and  imaging studies ? ?CBC: ?Recent Labs  ?Lab 03/26/22 ?1424 03/27/22 ?0533  ?WBC 10.2 5.2  ?NEUTROABS 9.3*  --   ?HGB 13.0 12.6  ?HCT 39.0 37.2  ?MCV 105.4* 103.6*  ?PLT 172 156  ? ?Basic Metabolic Panel: ?Recent Labs  ?Lab 03/26/22 ?1424 03/27/22 ?0533  ?NA 137 134*  ?K 3.8 4.0  ?CL 103 102  ?CO2 25 26  ?GLUCOSE 116* 100*  ?BUN 10 10  ?CREATININE 0.88 0.77  ?CALCIUM 8.7* 8.3*  ? ? ? ?No results found for this or any previous visit (from the past 240 hour(s)).  ? ?Radiology Studies: ?DG Chest 1 View ? ?Result Date: 03/26/2022 ?CLINICAL DATA:  Left hip fracture.  Pre-op  clearance exam EXAM: CHEST  1 VIEW COMPARISON:  07/13/2018 FINDINGS: The heart size and mediastinal contours are within normal limits. Pulmonary hyperinflation again seen, consistent with COPD. Small calcified granuloma again noted in the left lower lung. Both lungs are otherwise clear. The visualized skeletal structures are unremarkable. IMPRESSION: COPD. No active disease. Electronically Signed   By: Marlaine Hind M.D.   On: 03/26/2022 13:00  ? ?CT LUMBAR SPINE WO CONTRAST ? ?Result Date: 03/26/2022 ?CLINICAL DATA:  Low back pain. EXAM: CT LUMBAR SPINE WITHOUT CONTRAST TECHNIQUE: Multidetector CT imaging of the lumbar spine was performed without intravenous contrast administration. Multiplanar CT image reconstructions were also generated. RADIATION DOSE REDUCTION: This exam was performed according to the departmental dose-optimization program which includes automated exposure control, adjustment of the mA and/or kV according to patient size and/or use of iterative reconstruction technique. COMPARISON:  MRI 03/13/2005 FINDINGS: Segmentation: There are five lumbar type vertebral bodies. The last full intervertebral disc space is labeled L5-S1. Alignment: Normal Vertebrae: Moderate osteoporosis but no bone lesion or fracture. Facets are normally aligned. No pars defects. Paraspinal and other soft tissues: No significant paraspinal or retroperitoneal findings. Scattered aortic calcifications but no aneurysm. Disc levels: Mild bulging discs but no focal disc protrusions, significant spinal at T12-L1,L1-2, L2-3 or L3-4. L4-5: Broad-based disc protrusion asymmetric left. This in combination with facet disease and ligamentum flavum thickening creates moderate spinal and bilateral lateral recess stenosis. There is also a left foraminal component which appears to contact and slightly displace the left L4 nerve root. Recommend correlation with left L4 radiculopathy. L5-S1: Mild annular bulge with mild impression on the ventral  thecal sac but no significant spinal foraminal stenosis. 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 There is also a left foraminal component which appears to contact and slightly displace the left L4 nerve root. Recommend correlation with left L4 radiculopathy. No acute bony findings or bone lesions. Aortic Atherosclerosis (ICD10-I70.0). Electronically Signed   By: Marijo Sanes M.D.   On: 03/26/2022 15:10  ? ?DG Knee Complete 4 Views Left ? ?Result Date: 03/26/2022 ?CLINICAL DATA:  Fall wall hiking today.  Left knee pain. EXAM: LEFT KNEE - COMPLETE 4+ VIEW COMPARISON:  None. FINDINGS: No evidence of fracture, dislocation, or joint effusion. No evidence of arthropathy or other focal bone abnormality. Peripheral vascular calcification noted in the upper left leg. IMPRESSION: No acute findings. Electronically Signed   By: Marlaine Hind M.D.   On: 03/26/2022 15:29  ? ?DG Hip Unilat With Pelvis 2-3 Views Left ? ?Result Date: 03/26/2022 ?CLINICAL DATA:  Fall while hiking.  Left hip pain. EXAM: DG HIP (WITH OR WITHOUT PELVIS) 2-3V LEFT COMPARISON:  None. FINDINGS: Basicervical left femoral neck fracture is seen which is  displaced. No evidence of dislocation. No pelvic fracture identified. IMPRESSION: Displaced left femoral neck fracture. Electronically Signed   By: Marlaine Hind M.D.   On: 03/26/2022 12:59   ? ?Scheduled Meds: ? busPIRone  15 mg Oral Daily  ? clonazePAM  0.5 mg Oral Daily  ? docusate sodium  100 mg Oral BID  ? donepezil  10 mg Oral QHS  ? memantine  10 mg Oral BID  ? pantoprazole  40 mg Oral Daily  ? ?Continuous Infusions: ?  ceFAZolin (ANCEF) IV    ? methocarbamol (ROBAXIN) IV    ? ? ? LOS: 1 day  ? ?Shelly Coss, MD ?[facility] ?P4/04/2022, 8:37 AM   ?

## 2022-03-27 NOTE — Interval H&P Note (Signed)
The patient has been re-examined, and the chart reviewed, and there have been no interval changes to the documented history and physical.   ? ?Plan for left total hip arthroplasty for displaced left femoral neck fracture. ? ?The operative side was examined and the patient was confirmed to have. Sens DPN, SPN, TN intact, Motor EHL, ext, flex 5/5, and DP 2+, PT 2+, No significant edema. ? ? ?The risks, benefits, and alternatives have been discussed at length with patient, and the patient is willing to proceed.  Left hip marked. Consent has been signed. ? ?

## 2022-03-27 NOTE — Transfer of Care (Signed)
Immediate Anesthesia Transfer of Care Note ? ?Patient: Mackenzie Green ? ?Procedure(s) Performed: LEFT POSTERIOR TOTAL HIP ARTHROPLASTY (Left: Hip) ? ?Patient Location: PACU ? ?Anesthesia Type:Spinal ? ?Level of Consciousness: awake and oriented ? ?Airway & Oxygen Therapy: Patient Spontanous Breathing ? ?Post-op Assessment: Report given to RN and Post -op Vital signs reviewed and stable ? ?Post vital signs: Reviewed and stable ? ?Last Vitals:  ?Vitals Value Taken Time  ?BP 104/63 03/27/22 1953  ?Temp    ?Pulse 76 03/27/22 1957  ?Resp 11 03/27/22 1957  ?SpO2 99 % 03/27/22 1957  ?Vitals shown include unvalidated device data. ? ?Last Pain:  ?Vitals:  ? 03/27/22 1618  ?TempSrc: Oral  ?PainSc:   ?   ? ?  ? ?Complications: No notable events documented. ?

## 2022-03-27 NOTE — Anesthesia Preprocedure Evaluation (Signed)
Anesthesia Evaluation  ?Patient identified by MRN, date of birth, ID band ?Patient awake ? ? ? ?Reviewed: ?Allergy & Precautions, NPO status , Patient's Chart, lab work & pertinent test results ? ?Airway ?Mallampati: II ? ?TM Distance: >3 FB ?Neck ROM: Full ? ? ? Dental ?no notable dental hx. ? ?  ?Pulmonary ?neg pulmonary ROS,  ?  ?Pulmonary exam normal ?breath sounds clear to auscultation ? ? ? ? ? ? Cardiovascular ?negative cardio ROS ?Normal cardiovascular exam ?Rhythm:Regular Rate:Normal ? ? ?  ?Neuro/Psych ?negative neurological ROS ? negative psych ROS  ? GI/Hepatic ?negative GI ROS, Neg liver ROS,   ?Endo/Other  ?negative endocrine ROS ? Renal/GU ?negative Renal ROS  ?negative genitourinary ?  ?Musculoskeletal ?negative musculoskeletal ROS ?(+)  ? Abdominal ?  ?Peds ?negative pediatric ROS ?(+)  Hematology ?negative hematology ROS ?(+)   ?Anesthesia Other Findings ? ? Reproductive/Obstetrics ?negative OB ROS ? ?  ? ? ? ? ? ? ? ? ? ? ? ? ? ?  ?  ? ? ? ? ? ? ? ? ?Anesthesia Physical ?Anesthesia Plan ? ?ASA: 1 ? ?Anesthesia Plan: Spinal  ? ?Post-op Pain Management:   ? ?Induction: Intravenous ? ?PONV Risk Score and Plan: 2 and Ondansetron, Propofol infusion, Treatment may vary due to age or medical condition and Dexamethasone ? ?Airway Management Planned: Simple Face Mask ? ?Additional Equipment:  ? ?Intra-op Plan:  ? ?Post-operative Plan:  ? ?Informed Consent: I have reviewed the patients History and Physical, chart, labs and discussed the procedure including the risks, benefits and alternatives for the proposed anesthesia with the patient or authorized representative who has indicated his/her understanding and acceptance.  ? ? ? ?Dental advisory given ? ?Plan Discussed with: CRNA and Surgeon ? ?Anesthesia Plan Comments:   ? ? ? ? ? ? ?Anesthesia Quick Evaluation ? ?

## 2022-03-27 NOTE — Discharge Instructions (Signed)
INSTRUCTIONS AFTER JOINT REPLACEMENT  ? ?Remove items at home which could result in a fall. This includes throw rugs or furniture in walking pathways ?ICE to the affected joint every three hours while awake for 30 minutes at a time, for at least the first 3-5 days, and then as needed for pain and swelling.  Continue to use ice for pain and swelling. You may notice swelling that will progress down to the foot and ankle.  This is normal after surgery.  Elevate your leg when you are not up walking on it.   ?Continue to use the breathing machine you got in the hospital (incentive spirometer) which will help keep your temperature down.  It is common for your temperature to cycle up and down following surgery, especially at night when you are not up moving around and exerting yourself.  The breathing machine keeps your lungs expanded and your temperature down. ? ? ?DIET:  As you were doing prior to hospitalization, we recommend a well-balanced diet. ? ?DRESSING / WOUND CARE / SHOWERING ? ?Keep the surgical dressing until follow up.  The dressing is water proof, so you can shower without any extra covering.  IF THE DRESSING FALLS OFF or the wound gets wet inside, change the dressing with sterile gauze.  Please use good hand washing techniques before changing the dressing.  Do not use any lotions or creams on the incision until instructed by your surgeon.   ? ?ACTIVITY ? ?Increase activity slowly as tolerated, but follow the weight bearing instructions below.   ?No driving for 6 weeks or until further direction given by your physician.  You cannot drive while taking narcotics.  ?No lifting or carrying greater than 10 lbs. until further directed by your surgeon. ?Avoid periods of inactivity such as sitting longer than an hour when not asleep. This helps prevent blood clots.  ?You may return to work once you are authorized by your doctor.  ? ? ? ?WEIGHT BEARING  ? ?Weight bearing as tolerated with assist device (walker, cane,  etc) as directed, use it as long as suggested by your surgeon or therapist, typically at least 4-6 weeks. ? ? ?EXERCISES ? ?Results after joint replacement surgery are often greatly improved when you follow the exercise, range of motion and muscle strengthening exercises prescribed by your doctor. Safety measures are also important to protect the joint from further injury. Any time any of these exercises cause you to have increased pain or swelling, decrease what you are doing until you are comfortable again and then slowly increase them. If you have problems or questions, call your caregiver or physical therapist for advice.  ? ?Rehabilitation is important following a joint replacement. After just a few days of immobilization, the muscles of the leg can become weakened and shrink (atrophy).  These exercises are designed to build up the tone and strength of the thigh and leg muscles and to improve motion. Often times heat used for twenty to thirty minutes before working out will loosen up your tissues and help with improving the range of motion but do not use heat for the first two weeks following surgery (sometimes heat can increase post-operative swelling).  ? ?These exercises can be done on a training (exercise) mat, on the floor, on a table or on a bed. Use whatever works the best and is most comfortable for you.    Use music or television while you are exercising so that the exercises are a pleasant break in your   day. This will make your life better with the exercises acting as a break in your routine that you can look forward to.   Perform all exercises about fifteen times, three times per day or as directed.  You should exercise both the operative leg and the other leg as well. ? ?Exercises include: ?  ?Quad Sets - Tighten up the muscle on the front of the thigh (Quad) and hold for 5-10 seconds.   ?Straight Leg Raises - With your knee straight (if you were given a brace, keep it on), lift the leg to 60  degrees, hold for 3 seconds, and slowly lower the leg.  Perform this exercise against resistance later as your leg gets stronger.  ?Leg Slides: Lying on your back, slowly slide your foot toward your buttocks, bending your knee up off the floor (only go as far as is comfortable). Then slowly slide your foot back down until your leg is flat on the floor again.  ?Angel Wings: Lying on your back spread your legs to the side as far apart as you can without causing discomfort.  ?Hamstring Strength:  Lying on your back, push your heel against the floor with your leg straight by tightening up the muscles of your buttocks.  Repeat, but this time bend your knee to a comfortable angle, and push your heel against the floor.  You may put a pillow under the heel to make it more comfortable if necessary.  ? ?A rehabilitation program following joint replacement surgery can speed recovery and prevent re-injury in the future due to weakened muscles. Contact your doctor or a physical therapist for more information on knee rehabilitation.  ? ? ?CONSTIPATION ? ?Constipation is defined medically as fewer than three stools per week and severe constipation as less than one stool per week.  Even if you have a regular bowel pattern at home, your normal regimen is likely to be disrupted due to multiple reasons following surgery.  Combination of anesthesia, postoperative narcotics, change in appetite and fluid intake all can affect your bowels.  ? ?YOU MUST use at least one of the following options; they are listed in order of increasing strength to get the job done.  They are all available over the counter, and you may need to use some, POSSIBLY even all of these options:   ? ?Drink plenty of fluids (prune juice may be helpful) and high fiber foods ?Colace 100 mg by mouth twice a day  ?Senokot for constipation as directed and as needed Dulcolax (bisacodyl), take with full glass of water  ?Miralax (polyethylene glycol) once or twice a day as  needed. ? ?If you have tried all these things and are unable to have a bowel movement in the first 3-4 days after surgery call either your surgeon or your primary doctor.   ? ?If you experience loose stools or diarrhea, hold the medications until you stool forms back up.  If your symptoms do not get better within 1 week or if they get worse, check with your doctor.  If you experience "the worst abdominal pain ever" or develop nausea or vomiting, please contact the office immediately for further recommendations for treatment. ? ? ?ITCHING:  If you experience itching with your medications, try taking only a single pain pill, or even half a pain pill at a time.  You can also use Benadryl over the counter for itching or also to help with sleep.  ? ?TED HOSE STOCKINGS:  Use stockings on both   legs until for at least 2 weeks or as directed by physician office. They may be removed at night for sleeping. ? ?MEDICATIONS:  See your medication summary on the ?After Visit Summary? that nursing will review with you.  You may have some home medications which will be placed on hold until you complete the course of blood thinner medication.  It is important for you to complete the blood thinner medication as prescribed. ? ? ?Blood clot prevention (DVT Prophylaxis): After surgery you are at an increased risk for a blood clot. you were prescribed a blood thinner, Aspirin '81mg'$ , to be taken twice daily for a total of 4 weeks from surgery to help reduce your risk of getting a blood clot. This will help prevent a blood clot. Signs of a pulmonary embolus (blood clot in the lungs) include sudden short of breath, feeling lightheaded or dizzy, chest pain with a deep breath, rapid pulse rapid breathing. Signs of a blood clot in your arms or legs include new unexplained swelling and cramping, warm, red or darkened skin around the painful area. Please call the office or 911 right away if these signs or symptoms develop. ? ?PRECAUTIONS:  If you  experience chest pain or shortness of breath - call 911 immediately for transfer to the hospital emergency department.  ? ?If you develop a fever greater that 101 F, purulent drainage from wound, increased r

## 2022-03-27 NOTE — Anesthesia Procedure Notes (Signed)
Spinal ? ?Patient location during procedure: OR ?Start time: 03/27/2022 5:20 PM ?End time: 03/27/2022 5:25 PM ?Reason for block: surgical anesthesia ?Staffing ?Performed: anesthesiologist  ?Anesthesiologist: Myrtie Soman, MD ?Preanesthetic Checklist ?Completed: patient identified, IV checked, site marked, risks and benefits discussed, surgical consent, monitors and equipment checked, pre-op evaluation and timeout performed ?Spinal Block ?Patient position: right lateral decubitus ?Prep: Betadine ?Patient monitoring: heart rate, continuous pulse ox and blood pressure ?Approach: midline ?Location: L3-4 ?Injection technique: single-shot ?Needle ?Needle type: Sprotte  ?Needle gauge: 24 G ?Needle length: 9 cm ?Assessment ?Sensory level: T6 ?Events: CSF return ?Additional Notes ? ? ? ? ? ? ?

## 2022-03-27 NOTE — Anesthesia Preprocedure Evaluation (Signed)
Anesthesia Evaluation  ?Patient identified by MRN, date of birth, ID band ?Patient awake ? ? ? ?Reviewed: ?Allergy & Precautions, NPO status , Patient's Chart, lab work & pertinent test results ? ?History of Anesthesia Complications ?Negative for: history of anesthetic complications ? ?Airway ?Mallampati: II ? ?TM Distance: >3 FB ?Neck ROM: Full ? ? ? Dental ? ?(+) Teeth Intact, Dental Advisory Given ?  ?Pulmonary ?neg pulmonary ROS,  ?  ?breath sounds clear to auscultation ? ? ? ? ? ? Cardiovascular ?negative cardio ROS ? ? ?Rhythm:Regular  ? ?  ?Neuro/Psych ?negative neurological ROS ? negative psych ROS  ? GI/Hepatic ?negative GI ROS, Neg liver ROS,   ?Endo/Other  ?negative endocrine ROS ? Renal/GU ?negative Renal ROS  ? ?  ?Musculoskeletal ?Left hip fracture  ? Abdominal ?  ?Peds ? Hematology ?negative hematology ROS ?(+) Lab Results ?     Component                Value               Date                 ?     WBC                      5.2                 03/27/2022           ?     HGB                      12.6                03/27/2022           ?     HCT                      37.2                03/27/2022           ?     MCV                      103.6 (H)           03/27/2022           ?     PLT                      156                 03/27/2022           ?   ?Anesthesia Other Findings ? ? Reproductive/Obstetrics ? ?  ? ? ? ? ? ? ? ? ? ? ? ? ? ?  ?  ? ? ? ? ? ? ? ? ?Anesthesia Physical ?Anesthesia Plan ? ?ASA: 1 ? ?Anesthesia Plan: Regional  ? ?Post-op Pain Management: Regional block*  ? ?Induction:  ? ?PONV Risk Score and Plan: 2 and Treatment may vary due to age or medical condition ? ?Airway Management Planned: Nasal Cannula ? ?Additional Equipment: None ? ?Intra-op Plan:  ? ?Post-operative Plan:  ? ?Informed Consent: I have reviewed the patients History and Physical, chart, labs and discussed the procedure including the risks, benefits and alternatives for the proposed  anesthesia with the patient or authorized representative who has indicated  his/her understanding and acceptance.  ? ? ? ? ? ?Plan Discussed with:  ? ?Anesthesia Plan Comments:   ? ? ? ? ? ? ?Anesthesia Quick Evaluation ? ?

## 2022-03-27 NOTE — TOC CAGE-AID Note (Signed)
Transition of Care (TOC) - CAGE-AID Screening ? ? ?Patient Details  ?Name: Mackenzie Green ?MRN: [id number] ?Date of Birth: 10/08/1946 ? ?Transition of Care (TOC) CM/SW Contact:    ?Ravleen Ries C Tarpley-Carter, LCSWA ?Phone Number: ?03/27/2022, 3:14 PM ? ? ?Clinical Narrative: ?Pt participated in [place].  Pt stated she does not use substance or ETOH.  Pt was not offered resources, due to no usage of substance or ETOH.    ? ?Passenger transport manager, MSW, LCSW-A ?Pronouns:  She/Her/Hers ?[facility] ?Clinical Social Worker ?Direct Number:  [phone] ?[email or web address] ? ?CAGE-AID Screening: ?  ? ?Have You Ever Felt You Ought to Cut Down on Your Drinking or Drug Use?: No ?Have People Annoyed You By Critizing Your Drinking Or Drug Use?: No ?Have You Felt Bad Or Guilty About Your Drinking Or Drug Use?: No ?Have You Ever Had a Drink or Used Drugs First Thing In The Morning to Steady Your Nerves or to Get Rid of a Hangover?: No ?CAGE-AID Score: 0 ? ?Substance Abuse Education Offered: No ? ?  ? ? ? ? ? ? ?

## 2022-03-27 NOTE — Anesthesia Procedure Notes (Signed)
Procedure Name: [place] ?Date/Time: 03/27/2022 5:30 PM ?Performed by: Eligha Bridegroom, CRNA ?Pre-anesthesia Checklist: Patient identified, Emergency Drugs available, Suction available, Patient being monitored and Timeout performed ?Oxygen Delivery Method: Nasal cannula ?Preoxygenation: Pre-oxygenation with 100% oxygen ? ? ? ? ?

## 2022-03-27 NOTE — ED Notes (Signed)
To OR for block procedure ?

## 2022-03-27 NOTE — Progress Notes (Signed)
Orthopedic Tech Progress Note ?Patient Details:  ?Mackenzie Green ?July 07, 1946 ?[id number] ? ?Patient ID: Mackenzie Green, female   DOB: 02-Jul-1946, 76 y.o.   MRN: [id number] ?No OHF; over age limit. ? ?[place] ?03/27/2022, 7:36 PM ? ?

## 2022-03-28 ENCOUNTER — Other Ambulatory Visit: Payer: Self-pay

## 2022-03-28 DIAGNOSIS — S72002A Fracture of unspecified part of neck of left femur, initial encounter for closed fracture: Secondary | ICD-10-CM | POA: Diagnosis not present

## 2022-03-28 LAB — BASIC METABOLIC PANEL
Anion gap: 5 (ref 5–15)
BUN: 9 mg/dL (ref 8–23)
CO2: 24 mmol/L (ref 22–32)
Calcium: 8.2 mg/dL — ABNORMAL LOW (ref 8.9–10.3)
Chloride: 103 mmol/L (ref 98–111)
Creatinine, Ser: 0.84 mg/dL (ref 0.44–1.00)
GFR, Estimated: >60 mL/min (ref >60)
Glucose, Bld: 139 mg/dL — ABNORMAL HIGH (ref 70–99)
Potassium: 4.2 mmol/L (ref 3.5–5.1)
Sodium: 132 mmol/L — ABNORMAL LOW (ref 135–145)

## 2022-03-28 LAB — CBC WITH DIFFERENTIAL/PLATELET
Abs Immature Granulocytes: 0.03 10*3/uL (ref 0.00–0.07)
Basophils Absolute: 0 10*3/uL (ref 0.0–0.1)
Basophils Relative: 0 %
Eosinophils Absolute: 0 10*3/uL (ref 0.0–0.5)
Eosinophils Relative: 0 %
HCT: 37 % (ref 36.0–46.0)
Hemoglobin: 12.6 g/dL (ref 12.0–15.0)
Immature Granulocytes: 0 %
Lymphocytes Relative: 4 %
Lymphs Abs: 0.3 10*3/uL — ABNORMAL LOW (ref 0.7–4.0)
MCH: 34.8 pg — ABNORMAL HIGH (ref 26.0–34.0)
MCHC: 34.1 g/dL (ref 30.0–36.0)
MCV: 102.2 fL — ABNORMAL HIGH (ref 80.0–100.0)
Monocytes Absolute: 0.6 10*3/uL (ref 0.1–1.0)
Monocytes Relative: 7 %
Neutro Abs: 7 10*3/uL (ref 1.7–7.7)
Neutrophils Relative %: 89 %
Platelets: 142 10*3/uL — ABNORMAL LOW (ref 150–400)
RBC: 3.62 MIL/uL — ABNORMAL LOW (ref 3.87–5.11)
RDW: 12.4 % (ref 11.5–15.5)
WBC: 7.9 10*3/uL (ref 4.0–10.5)
nRBC: 0 % (ref 0.0–0.2)

## 2022-03-28 MED ORDER — ADULT MULTIVITAMIN W/MINERALS CH
1.0000 | ORAL_TABLET | Freq: Every day | ORAL | Status: DC
  Administered 2022-03-30: 1 via ORAL
  Filled 2022-03-28 (×3): qty 1

## 2022-03-28 MED ORDER — FERROUS SULFATE 325 (65 FE) MG PO TABS
325.0000 mg | ORAL_TABLET | Freq: Every day | ORAL | Status: DC
Start: 2022-03-28 — End: 2022-04-01
  Administered 2022-03-30: 325 mg via ORAL
  Filled 2022-03-28 (×3): qty 1

## 2022-03-28 MED ORDER — POLYETHYLENE GLYCOL 3350 17 G PO PACK
17.0000 g | PACK | Freq: Every day | ORAL | Status: DC
  Filled 2022-03-28 (×3): qty 1

## 2022-03-28 MED ORDER — ASPIRIN EC 81 MG PO TBEC
81.0000 mg | DELAYED_RELEASE_TABLET | Freq: Two times a day (BID) | ORAL | Status: DC
  Administered 2022-03-28 – 2022-04-01 (×9): 81 mg via ORAL
  Filled 2022-03-28 (×9): qty 1

## 2022-03-28 MED ORDER — ASCORBIC ACID 500 MG PO TABS
500.0000 mg | ORAL_TABLET | Freq: Every day | ORAL | Status: DC
  Administered 2022-03-30: 500 mg via ORAL
  Filled 2022-03-28 (×3): qty 1

## 2022-03-28 MED ORDER — CEFAZOLIN SODIUM-DEXTROSE 2-4 GM/100ML-% IV SOLN
2.0000 g | Freq: Three times a day (TID) | INTRAVENOUS | Status: AC
  Administered 2022-03-28 (×2): 2 g via INTRAVENOUS
  Filled 2022-03-28 (×2): qty 100

## 2022-03-28 MED ORDER — CLONAZEPAM 0.5 MG PO TABS
0.5000 mg | ORAL_TABLET | Freq: Two times a day (BID) | ORAL | Status: DC | PRN
Start: 2022-03-28 — End: 2022-04-01

## 2022-03-28 MED ORDER — VITAMIN D 25 MCG (1000 UNIT) PO TABS: 1000.0000 [IU] | ORAL_TABLET | Freq: Every day | ORAL | Status: DC

## 2022-03-28 MED ORDER — ENSURE ENLIVE PO LIQD: 237.0000 mL | Freq: Two times a day (BID) | ORAL | Status: DC

## 2022-03-28 MED ORDER — VITAMIN D 25 MCG (1000 UNIT) PO TABS
1000.0000 [IU] | ORAL_TABLET | Freq: Every day | ORAL | Status: DC
  Administered 2022-03-28 – 2022-03-30 (×2): 1000 [IU] via ORAL
  Filled 2022-03-28 (×4): qty 1

## 2022-03-28 MED ORDER — KETOROLAC TROMETHAMINE 15 MG/ML IJ SOLN
7.5000 mg | Freq: Three times a day (TID) | INTRAMUSCULAR | Status: AC
  Administered 2022-03-28 – 2022-03-29 (×4): 7.5 mg via INTRAVENOUS
  Filled 2022-03-28 (×4): qty 1

## 2022-03-28 MED ORDER — ACETAMINOPHEN 500 MG PO TABS
1000.0000 mg | ORAL_TABLET | Freq: Three times a day (TID) | ORAL | Status: AC
  Administered 2022-03-28 – 2022-03-30 (×9): 1000 mg via ORAL
  Filled 2022-03-28 (×9): qty 2

## 2022-03-28 NOTE — Progress Notes (Signed)
?PROGRESS NOTE ? ?Mackenzie Green  HUD:[id number] DOB: 1946-11-24 DOA: 03/26/2022 ?PCP: Aretta Nip, MD  ? ?Brief Narrative: ?Patient is a 76 year old female with no significant medical problems except for iron deficiency anemia, mild cognitive impairment who presented to the emergency department with complaint of left hip pain after she fell on her left hip while she was hiking.  X-ray of the left hip showed displaced left femoral neck fracture.  Orthopedics consulted,underwent total left  hip arthroplasty on 4/5.PT/OT evaluation pending ? ?Assessment & Plan: ? ?Principal Problem: ?  Hip fracture ([place]) ?Active Problems: ?  Mild cognitive impairment with memory loss ?  Depression with anxiety ?  Spinal stenosis at L4-L5 level ? ? ?Left hip fracture: [place] on the left side while hiking.  No history of head injury. X-ray of the left hip showed displaced left femoral neck fracture.  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  Continue supportive care, pain management.  DVT prophylaxis with aspirin BID, PT/OT evaluation pending. ?Patient is able to ambulate normally on her usual days, lives alone. ?Continue bowel regimen ? ?Back pain: CT lumbar spine showed broad-based disc protrusion asymmetric left at L4-5 contributing to moderate spinal and bilateral lateral recess stenosis. T continue supportive care, pain management.  Follow-up with neurosurgery as an outpatient.   ? ?History of iron deficiency: Takes iron supplementation at home. ? ? ?  ?  ? ?DVT prophylaxis:SCDs Start: 03/26/22 1627 ? ? ?  Code Status: Full Code ? ?Family Communication: Called and discussed with son on phone on 4/6 ?Patient status: Inpatient ? ?Patient is from : Home ? ?Anticipated discharge to: Home versus skilled nursing facility ? ?Estimated DC date: 1 to 2 days ? ? ?Consultants: Orthopedics ? ?Procedures: Plan for ORIF ? ?Antimicrobials:  ?Anti-infectives (From admission, onward)  ? ? Start     Dose/Rate Route Frequency  Ordered Stop  ? 03/28/22 0745  ceFAZolin (ANCEF) IVPB 2g/100 mL premix       ? 2 g ?200 mL/hr over 30 Minutes Intravenous Every 8 hours 03/28/22 0654 03/28/22 2159  ? 03/27/22 1430  ceFAZolin (ANCEF) IVPB 2g/100 mL premix       ? 2 g ?200 mL/hr over 30 Minutes Intravenous On call to O.R. 03/27/22 0036 03/27/22 1720  ? 03/27/22 0600  ceFAZolin (ANCEF) IVPB 2g/100 mL premix  Status:  Discontinued       ? 2 g ?200 mL/hr over 30 Minutes Intravenous On call to O.R. 03/27/22 0030 03/27/22 0036  ? ?  ? ? ?Subjective: ?Patient seen and examined at the bedside this morning.  Hemodynamically stable.  Pain well controlled.  Complains of hunger, waiting for food ?Objective: ?Vitals:  ? 03/27/22 2210 03/28/22 0310 03/28/22 8588 03/28/22 5027  ?BP: (!) 113/57 (!) 122/52 (!) 117/51 102/83  ?Pulse: 76   100  ?Resp: '13 20 16 16  '$ ?Temp:    99.7 ?F (37.6 ?C)  ?TempSrc: Oral   Oral  ?SpO2:    100%  ?Weight:      ?Height:      ? ? ?Intake/Output Summary (Last 24 hours) at 03/28/2022 1055 ?Last data filed at 03/27/2022 2215 ?Gross per 24 hour  ?Intake 1740 ml  ?Output 740 ml  ?Net 1000 ml  ? ?Filed Weights  ? 03/26/22 1217 03/27/22 1618  ?Weight: 45.8 kg 45.8 kg  ? ? ?Examination: ? ?General exam: Overall comfortable, not in distress ?HEENT: PERRL ?Respiratory system:  no wheezes or crackles  ?Cardiovascular system: S1 &  S2 heard, RRR.  ?Gastrointestinal system: Abdomen is nondistended, soft and nontender. ?Central nervous system: Alert and oriented ?Extremities: No edema, no clubbing ,no cyanosis, surgical wound on the left hip ?Skin: No rashes, no ulcers,no icterus   ? ? ?Data Reviewed: I have personally reviewed following labs and imaging studies ? ?CBC: ?Recent Labs  ?Lab 03/26/22 ?1424 03/27/22 ?1610 03/28/22 ?0140  ?WBC 10.2 5.2 7.9  ?NEUTROABS 9.3*  --  7.0  ?HGB 13.0 12.6 12.6  ?HCT 39.0 37.2 37.0  ?MCV 105.4* 103.6* 102.2*  ?PLT 172 156 142*  ? ?Basic Metabolic Panel: ?Recent Labs  ?Lab 03/26/22 ?1424 03/27/22 ?9604 03/28/22 ?0140   ?NA 137 134* 132*  ?K 3.8 4.0 4.2  ?CL 103 102 103  ?CO2 '25 26 24  '$ ?GLUCOSE 116* 100* 139*  ?BUN '10 10 9  '$ ?CREATININE 0.88 0.77 0.84  ?CALCIUM 8.7* 8.3* 8.2*  ? ? ? ?No results found for this or any previous visit (from the past 240 hour(s)).  ? ?Radiology Studies: ?DG Chest 1 View ? ?Result Date: 03/26/2022 ?CLINICAL DATA:  Left hip fracture.  Pre-op clearance exam EXAM: CHEST  1 VIEW COMPARISON:  07/13/2018 FINDINGS: The heart size and mediastinal contours are within normal limits. Pulmonary hyperinflation again seen, consistent with COPD. Small calcified granuloma again noted in the left lower lung. Both lungs are otherwise clear. The visualized skeletal structures are unremarkable. IMPRESSION: COPD. No active disease. Electronically Signed   By: Marlaine Hind M.D.   On: 03/26/2022 13:00  ? ?CT LUMBAR SPINE WO CONTRAST ? ?Result Date: 03/26/2022 ?CLINICAL DATA:  Low back pain. EXAM: CT LUMBAR SPINE WITHOUT CONTRAST TECHNIQUE: Multidetector CT imaging of the lumbar spine was performed without intravenous contrast administration. Multiplanar CT image reconstructions were also generated. RADIATION DOSE REDUCTION: This exam was performed according to the departmental dose-optimization program which includes automated exposure control, adjustment of the mA and/or kV according to patient size and/or use of iterative reconstruction technique. COMPARISON:  MRI 03/13/2005 FINDINGS: Segmentation: There are five lumbar type vertebral bodies. The last full intervertebral disc space is labeled L5-S1. Alignment: Normal Vertebrae: Moderate osteoporosis but no bone lesion or fracture. Facets are normally aligned. No pars defects. Paraspinal and other soft tissues: No significant paraspinal or retroperitoneal findings. Scattered aortic calcifications but no aneurysm. Disc levels: Mild bulging discs but no focal disc protrusions, significant spinal at T12-L1,L1-2, L2-3 or L3-4. L4-5: Broad-based disc protrusion asymmetric left. This  in combination with facet disease and ligamentum flavum thickening creates moderate spinal and bilateral lateral recess stenosis. There is also a left foraminal component which appears to contact and slightly displace the left L4 nerve root. Recommend correlation with left L4 radiculopathy. L5-S1: Mild annular bulge with mild impression on the ventral thecal sac but no significant spinal foraminal stenosis. 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 There is also a left foraminal component which appears to contact and slightly displace the left L4 nerve root. Recommend correlation with left L4 radiculopathy. No acute bony findings or bone lesions. Aortic Atherosclerosis (ICD10-I70.0). Electronically Signed   By: Marijo Sanes M.D.   On: 03/26/2022 15:10  ? ?DG Knee Complete 4 Views Left ? ?Result Date: 03/26/2022 ?CLINICAL DATA:  Fall wall hiking today.  Left knee pain. EXAM: LEFT KNEE - COMPLETE 4+ VIEW COMPARISON:  None. FINDINGS: No evidence of fracture, dislocation, or joint effusion. No evidence of arthropathy or other focal bone abnormality. Peripheral vascular calcification noted in  the upper left leg. IMPRESSION: No acute findings. Electronically Signed   By: Marlaine Hind M.D.   On: 03/26/2022 15:29  ? ?DG HIP PORT UNILAT WITH PELVIS 1V LEFT ? ?Result Date: 03/27/2022 ?CLINICAL DATA:  Intraoperative left hip for left total hip arthroplasty. EXAM: DG HIP (WITH OR WITHOUT PELVIS) 1V PORT LEFT COMPARISON:  None. FINDINGS: A single portable cross-table lateral view of the left hip is obtained demonstrating interval placement of a left hip arthroplasty using non cemented components. Screw fixation of the acetabular cup. Components appear well seated. Soft tissue gas is consistent with recent surgery. IMPRESSION: Left total hip arthroplasty.  No acute complication is indicated. Electronically Signed   By: Lucienne Capers M.D.    On: 03/27/2022 19:28  ? ?DG HIP UNILAT W OR W/O PELVIS 2-3 VIEWS LEFT ? ?Result Date: 03/27/2022 ?CLINICAL DATA:  Postoperative. EXAM: DG HIP (WITH OR WITHOUT PELVIS) 2-3V LEFT COMPARISON:  03/27/2022. FIN

## 2022-03-28 NOTE — Progress Notes (Signed)
? ? ? ?  Subjective: ? ?Patient reports pain as moderate.  She feels a lot of cramping in her thigh and groin area and reluctant to move her left leg.  She has been getting as needed postop pain medications.  I added Toradol and scheduled Tylenol this morning.  Denies distal numbness and tingling. ? ?Objective:  ? ?VITALS:   ?Vitals:  ? 03/27/22 2120 03/27/22 2210 03/28/22 0310 03/28/22 0512  ?BP: 100/67 (!) 113/57 (!) 122/52 (!) 117/51  ?Pulse: 72 76    ?Resp: '12 13 20 16  '$ ?Temp:      ?TempSrc:  Oral    ?SpO2: 96%     ?Weight:      ?Height:      ? ? ?Sensation intact distally ?Intact pulses distally ?Dorsiflexion/Plantar flexion intact ?Incision: dressing C/D/I ?No cellulitis present ?Compartment soft ?  ? ?Lab Results  ?Component Value Date  ? WBC 7.9 03/28/2022  ? HGB 12.6 03/28/2022  ? HCT 37.0 03/28/2022  ? MCV 102.2 (H) 03/28/2022  ? PLT 142 (L) 03/28/2022  ? ?BMET ?   ?Component Value Date/Time  ? NA 132 (L) 03/28/2022 0140  ? K 4.2 03/28/2022 0140  ? CL 103 03/28/2022 0140  ? CO2 24 03/28/2022 0140  ? GLUCOSE 139 (H) 03/28/2022 0140  ? BUN 9 03/28/2022 0140  ? CREATININE 0.84 03/28/2022 0140  ? CALCIUM 8.2 (L) 03/28/2022 0140  ? GFRNONAA >60 03/28/2022 0140  ? ? ? ? ?Xray: Postop x-rays show left total hip arthroplasty in good position without adverse features.  Leg lengths appear restored. ? ?Assessment/Plan: ?1 Day Post-Op  ? ?Principal Problem: ?  Hip fracture ([place]) ?Active Problems: ?  Mild cognitive impairment with memory loss ?  Depression with anxiety ?  Spinal stenosis at L4-L5 level ? ?L THA for femoral neck fracture on 4/5 ? ?Post op recs: ?WB: WBAT LLE, posterior hip precautions x6 weeks ?Abx: ancef x23 hours post op ?Imaging: PACU pelvis Xray ?Dressing: Aquacell, keep intact until follow up ?DVT prophylaxis: Aspirin 81BID starting POD1 ?Follow up: 2 weeks after surgery for a wound check with Dr. Zachery Dakins at [facility].  ?Address: [address] [place], [place], [place] [address]   ?Office Phone: [phone] ? ? ? ?Kaylyne Axton A Keyri Salberg ?03/28/2022, 8:11 AM ? ? ?Charlies Constable, MD ? ?Contact information:   ?Weekdays 7am-5pm epic message Dr. Zachery Dakins, or call office for patient follow up: [phone] ?After hours and holidays please check [email or web address] for group call information for [facility] ? ?  ?

## 2022-03-28 NOTE — Plan of Care (Signed)

## 2022-03-28 NOTE — Evaluation (Signed)
Occupational Therapy Evaluation ?Patient Details ?Name: Mackenzie Green ?MRN: [id number] ?DOB: 03-29-1946 ?Today's Date: 03/28/2022 ? ? ?History of Present Illness  76 yo female presenting to ED with fall sustained while hiking resulting in L hip injury. X-ray on 4/4 obtained in triage shows a left femoral neck fracture. S/p L THA on 4/5; posterior hip precautions. PMH including depression with anxiety, Mild cognitive impairment with memory loss, and spinal stenosis at L4-5 level.    ? ?Clinical Impression ?  ?PTA, pt was living alone and was independent; enjoys hiking with her friends. Currently, pt requires Mod-Max A for LB ADLs and Min-Mod A for functional mobility using RW. Provided education (reviewing handout) on posterior hip precautions. Pt presenting with decreased balance, functional use of LLE, and functional performance. Pt would benefit from further acute OT to facilitate safe dc. Recommend dc to SNF for further OT to optimize safety, independence with ADLs, and return to PLOF.  ?   ? ?Recommendations for follow up therapy are one component of a multi-disciplinary discharge planning process, led by the attending physician.  Recommendations may be updated based on patient status, additional functional criteria and insurance authorization.  ? ?Follow Up Recommendations ? Skilled nursing-short term rehab (<3 hours/day)  ?  ?Assistance Recommended at Discharge Frequent or constant Supervision/Assistance  ?Patient can return home with the following A little help with walking and/or transfers;A little help with bathing/dressing/bathroom ? ?  ?Functional Status Assessment ? Patient has had a recent decline in their functional status and demonstrates the ability to make significant improvements in function in a reasonable and predictable amount of time.  ?Equipment Recommendations ? Other (comment) (defer to next venue)  ?  ?Recommendations for Other Services PT consult ? ? ?  ?Precautions / Restrictions  Precautions ?Precautions: Fall;Posterior Hip ?Precaution Booklet Issued: Yes (comment) ?Precaution Comments: Reviewed posterior hip precautions; at end of session, pt able to recall "dont bend too far" ?Restrictions ?Weight Bearing Restrictions: Yes ?LLE Weight Bearing: Weight bearing as tolerated  ? ?  ? ?Mobility Bed Mobility ?  ?  ?  ?  ?  ?  ?  ?General bed mobility comments: In recliner upon arrival ?  ? ?Transfers ?Overall transfer level: Needs assistance ?Equipment used: Rolling walker (2 wheels) ?Transfers: Sit to/from Stand ?Sit to Stand: Min assist ?  ?  ?  ?  ?  ?General transfer comment: Min A for initating power up and weight shift forward ?  ? ?  ?Balance Overall balance assessment: Needs assistance ?Sitting-balance support: No upper extremity supported, Feet supported ?Sitting balance-Leahy Scale: Fair ?  ?  ?Standing balance support: Bilateral upper extremity supported, During functional activity ?Standing balance-Leahy Scale: Poor ?Standing balance comment: Able to achieve upright posture with Min A and UE support; however, posterior lean whenever performing mobility forward ?  ?  ?  ?  ?  ?  ?  ?  ?  ?  ?  ?   ? ?ADL either performed or assessed with clinical judgement  ? ?ADL Overall ADL's : Needs assistance/impaired ?Eating/Feeding: Set up;Sitting ?  ?Grooming: Set up;Sitting ?  ?Upper Body Bathing: Minimal assistance;Sitting ?  ?Lower Body Bathing: Moderate assistance;Sit to/from stand ?  ?Upper Body Dressing : Minimal assistance;Sitting ?  ?Lower Body Dressing: Maximal assistance;Sit to/from stand ?  ?Toilet Transfer: Minimal assistance;Ambulation;Rolling walker (2 wheels) (simulated to recliner) ?Toilet Transfer Details (indicate cue type and reason): Min A for power up. Education on correct positioning with LLE kicked out before power  up. ?  ?  ?  ?  ?Functional mobility during ADLs: Moderate assistance;Rolling walker (2 wheels) (~5 feet) ?General ADL Comments: Pt performing sit<>stand and  then functional mobility with Min-Mod A. Pt requiring Mod A for managing LLE to faciltiate normalized movement pattern during gait. Pt with tendency for adductions with step forward. Discussion after therapy about dc recommendation and pt agreement with need for rehab  ? ? ? ?Vision   ?   ?   ?Perception   ?  ?Praxis   ?  ? ?Pertinent Vitals/Pain Pain Assessment ?Pain Assessment: Faces ?Faces Pain Scale: Hurts even more ?Pain Location: With LLE movement ?Pain Descriptors / Indicators: Discomfort, Grimacing ?Pain Intervention(s): Monitored during session, Limited activity within patient's tolerance, Repositioned  ? ? ? ?Hand Dominance   ?  ?Extremity/Trunk Assessment Upper Extremity Assessment ?Upper Extremity Assessment: Overall WFL for tasks assessed ?  ?Lower Extremity Assessment ?Lower Extremity Assessment: Defer to PT evaluation;LLE deficits/detail ?LLE Deficits / Details: Poor strength and tendency for adduction of LLE during forward flexion in mobility ?  ?Cervical / Trunk Assessment ?Cervical / Trunk Assessment: Normal ?  ?Communication Communication ?Communication: No difficulties ?  ?Cognition Arousal/Alertness: Awake/alert ?Behavior During Therapy: [facility] for tasks assessed/performed ?Overall Cognitive Status: Impaired/Different from baseline ?Area of Impairment: Following commands, Problem solving, Awareness ?  ?  ?  ?  ?  ?  ?  ?  ?  ?  ?  ?Following Commands: Follows one step commands with increased time ?  ?Awareness: Emergent ?Problem Solving: Slow processing, Requires verbal cues, Requires tactile cues ?General Comments: Mild cognitive impairment with memory loss at baseline. Currently requiring increased cues for recall of precautions and education. Needing increased time and verbal/tactile cues for sequencing of mobility. Decreased awareness. Oriented and very motivated. ?  ?  ?General Comments  HR 80s ? ?  ?Exercises Exercises: General Lower Extremity ?General Exercises - Lower Extremity ?Ankle  Circles/Pumps: AROM, Both, 10 reps, Seated (BLEs elevated) ?Short Arc Quad: AROM, Right, 10 reps, Seated ?  ?Shoulder Instructions    ? ? ?Home Living Family/patient expects to be discharged to:: Private residence ?Living Arrangements: Alone ?Available Help at Discharge: Family;Neighbor;Available PRN/intermittently ?Type of Home: House (Townhouse) ?Home Access: Stairs to enter ?Entrance Stairs-Number of Steps: 1 ?  ?Home Layout: One level ?  ?  ?Bathroom Shower/Tub: Tub/shower unit;Walk-in shower ?  ?  ?  ?  ?Home Equipment: BSC/3in1 ?  ?  ?  ? ?  ?Prior Functioning/Environment Prior Level of Function : Independent/Modified Independent ?  ?  ?  ?  ?  ?  ?  ?ADLs Comments: ADLs and IADLs, enjoys hiking. Retired Engineer, water ?  ? ?  ?  ?OT Problem List: Decreased strength;Decreased activity tolerance;Decreased range of motion;Impaired balance (sitting and/or standing);Decreased knowledge of use of DME or AE;Decreased knowledge of precautions ?  ?   ?OT Treatment/Interventions: Self-care/ADL training;Therapeutic exercise;Energy conservation;DME and/or AE instruction;Therapeutic activities;Patient/family education  ?  ?OT Goals(Current goals can be found in the care plan section) Acute Rehab OT Goals ?Patient Stated Goal: Go home ?OT Goal Formulation: With patient ?Time For Goal Achievement: 04/11/22 ?Potential to Achieve Goals: Good  ?OT Frequency: Min 2X/week ?  ? ?Co-evaluation   ?  ?  ?  ?  ? ?  ?AM-PAC OT "6 Clicks" Daily Activity     ?Outcome Measure Help from another person eating meals?: None ?Help from another person taking care of personal grooming?: A Little ?Help from another person toileting, which includes  using toliet, bedpan, or urinal?: A Lot ?Help from another person bathing (including washing, rinsing, drying)?: A Lot ?Help from another person to put on and taking off regular upper body clothing?: A Little ?Help from another person to put on and taking off regular lower body clothing?: A Lot ?6 Click  Score: 16 ?  ?End of Session Equipment Utilized During Treatment: Gait belt;Rolling walker (2 wheels) ?Nurse Communication: Mobility status;Patient requests pain meds ? ?Activity Tolerance: Patient tolerated treatment

## 2022-03-28 NOTE — Progress Notes (Addendum)
Initial Nutrition Assessment ? ?DOCUMENTATION CODES:  ? ?Underweight ? ?INTERVENTION:  ? ?Ensure Enlive po BID, each supplement provides 350 kcal and 20 grams of protein. ? ?MVI po daily  ? ?NUTRITION DIAGNOSIS:  ? ?Increased nutrient needs related to hip fracture as evidenced by estimated needs. ? ?GOAL:  ? ?Patient will meet greater than or equal to 90% of their needs ? ?MONITOR:  ? ?PO intake, Supplement acceptance, Labs, Weight trends, Skin, I & O's ? ?REASON FOR ASSESSMENT:  ? ?Consult ?Hip fracture protocol ? ?ASSESSMENT:  ? ?76 year old female with h/o anxiety, depression, spinal stenosis, iron deficiency anemia and mild cognitive impairment who is admitted with displaced left femoral neck fracture after fall now s/p total hip arthroplasty 4/5. ? ?RD working remotely. ? ?Attempted to call pt's room today. Pt would answer but then kept hanging up; RD unsure if patient could hear or understand who was calling. Spoke with RN, pt ate 100% of breakfast and 50% of lunch today. Pt with increased estimated needs r/t hip fracture. RD will add supplements to help pt meet her estimated needs. Per chart, pt appears weight stable at baseline. RD will obtain nutrition related history and exam at follow-up. Pt is at high risk for malnutrition.  ? ?Medications reviewed and include: vitamin C, D3, colace, ferrous sulfate, MVI, protonix, miralax, cefazolin  ? ?Labs reviewed: Na 132(L) ? ?NUTRITION - FOCUSED PHYSICAL EXAM: ?Unable to perform at this time  ? ?Diet Order:   ?Diet Order   ? ?       ?  Diet regular Room service appropriate? Yes; Fluid consistency: Thin  Diet effective now       ?  ? ?  ?  ? ?  ? ?EDUCATION NEEDS:  ? ?Not appropriate for education at this time ? ?Skin:  Skin Assessment: Reviewed RN Assessment (incision L hip) ? ?Last BM:  pta ? ?Height:  ? ?Ht Readings from Last 1 Encounters:  ?03/27/22 '5\' 4"'$  (1.626 m)  ? ? ?Weight:  ? ?Wt Readings from Last 1 Encounters:  ?03/27/22 45.8 kg  ? ? ?Ideal Body Weight:   54.5 kg ? ?BMI:  Body mass index is 17.33 kg/m?. ? ?Estimated Nutritional Needs:  ? ?Kcal:  1400-1600kcal/day ? ?Protein:  70-80g/day ? ?Fluid:  1.2-1.4L/day ? ?Koleen Distance MS, RD, LDN ?Please refer to [facility] for RD and/or RD on-call/weekend/after hours pager ? ?

## 2022-03-28 NOTE — TOC Initial Note (Signed)
Transition of Care (TOC) - Initial/Assessment Note  ? ? ?Patient Details  ?Name: Mackenzie Green ?MRN: [id number] ?Date of Birth: October 30, 1946 ? ?Transition of [facility]) CM/SW Contact:    ?Joanne Chars, LCSW ?Phone Number: ?03/28/2022, 4:11 PM ? ?Clinical Narrative:   CSW met with pt and two sons, Thurmond Butts and Larkin Ina.  Two neighbors also in the room, permission given by pt to speak with all of these people present.  Discussed PT recommendation for SNF.  Pt and sons hoping that CIR is option, CSW agreed to ask PT/CIR to see about this.  Pt lives alone in a townhome, good support from family/friends.  Pt is vaccinated for covid with 2 boosters. ? ?CSW reached to out PT to see if CIR would be option.               ? ? ?Expected Discharge Plan: [place] ?Barriers to Discharge: Continued Medical Work up, Other (must enter comment) (pt asking for possible CIR admit, prefers to SNF) ? ? ?Patient Goals and CMS Choice ?Patient states their goals for this hospitalization and ongoing recovery are:: "get back on the trail" ?  ?  ? ?Expected Discharge Plan and Services ?Expected Discharge Plan: [place] ?In-house Referral: Clinical Social Work ?  ?Post Acute Care Choice: IP Rehab ?Living arrangements for the past 2 months: [place] ?                ?  ?  ?  ?  ?  ?  ?  ?  ?  ?  ? ?Prior Living Arrangements/Services ?Living arrangements for the past 2 months: [place] ?Lives with:: Self ?Patient language and need for interpreter reviewed:: Yes ?Do you feel safe going back to the place where you live?: Yes      ?Need for Family Participation in Patient Care: No (Comment) ?Care giver support system in place?: Yes (comment) ?Current home services: Other (comment) (none) ?Criminal Activity/Legal Involvement Pertinent to Current Situation/Hospitalization: No - Comment as needed ? ?Activities of Daily Living ?  ?  ? ?Permission Sought/Granted ?Permission sought to share information with  : Family Supports ?Permission granted to share information with : Yes, Verbal Permission Granted ? Share Information with NAME: sons Larkin Ina and Thurmond Butts ?   ?   ?   ? ?Emotional Assessment ?Appearance:: Appears stated age ?Attitude/Demeanor/Rapport: Engaged ?Affect (typically observed): Appropriate, Pleasant ?Orientation: : Oriented to Self, Oriented to Place, Oriented to  Time, Oriented to Situation ?Alcohol / Substance Use: Not Applicable ?Psych Involvement: No (comment) ? ?Admission diagnosis:  Hip fracture ([place]) [S72.009A] ?Left displaced femoral neck fracture ([place]) [S72.002A] ?Patient Active Problem List  ? Diagnosis Date Noted  ? Hip fracture ([place]) 03/26/2022  ? Mild cognitive impairment with memory loss 03/26/2022  ? Depression with anxiety 03/26/2022  ? Spinal stenosis at L4-L5 level 03/26/2022  ? Neutropenia ([place]) 08/27/2016  ? Weight loss 07/31/2012  ? Hypokalemia 07/31/2012  ? Hyponatremia 07/30/2012  ? Hypoglycemia 07/30/2012  ? ?PCP:  Aretta Nip, MD ?Pharmacy:   ?[facility] [place] ?[place] ?[place] [address] ?Phone: [phone] Fax: [phone] ? ?[id number] - [place], [address] ?[address] ?[place] [address] ?Phone: [phone] Fax: [phone] ? ? ? ? ?Social Determinants of Health (SDOH) Interventions ?  ? ?Readmission Risk Interventions ?   ? View :  No data to display.  ?  ?  ?  ? ? ? ?

## 2022-03-28 NOTE — Plan of Care (Signed)
?  Problem: Education: ?Goal: Knowledge of General Education information will improve ?Description: Including pain rating scale, medication(s)/side effects and non-pharmacologic comfort measures ?Outcome: Progressing ?  ?Problem: Health Behavior/Discharge Planning: ?Goal: Ability to manage health-related needs will improve ?Outcome: Progressing ?  ?Problem: Clinical Measurements: ?Goal: Ability to maintain clinical measurements within normal limits will improve ?Outcome: Progressing ?Goal: Will remain free from infection ?Outcome: Progressing ?Goal: Diagnostic test results will improve ?Outcome: Progressing ?Goal: Respiratory complications will improve ?Outcome: Progressing ?Goal: Cardiovascular complication will be avoided ?Outcome: Progressing ?  ?Problem: Activity: ?Goal: Risk for activity intolerance will decrease ?Outcome: Progressing ?  ?Problem: Nutrition: ?Goal: Adequate nutrition will be maintained ?Outcome: Progressing ?  ?Problem: Coping: ?Goal: Level of anxiety will decrease ?Outcome: Progressing ?  ?Problem: Elimination: ?Goal: Will not experience complications related to bowel motility ?Outcome: Progressing ?Goal: Will not experience complications related to urinary retention ?Outcome: Progressing ?  ?Problem: Pain Managment: ?Goal: General experience of comfort will improve ?Outcome: Progressing ?  ?Problem: Safety: ?Goal: Ability to remain free from injury will improve ?Outcome: Progressing ?  ?Problem: Skin Integrity: ?Goal: Risk for impaired skin integrity will decrease ?Outcome: Progressing ?  ?Problem: Education: ?Goal: Verbalization of understanding the information provided (i.e., activity precautions, restrictions, etc) will improve ?Outcome: Progressing ?Goal: Individualized Educational Video(s) ?Outcome: Progressing ?  ?Problem: Activity: ?Goal: Ability to ambulate and perform ADLs will improve ?Outcome: Progressing ?  ?Problem: Clinical Measurements: ?Goal: Postoperative complications will be  avoided or minimized ?Outcome: Progressing ?  ?Problem: Self-Concept: ?Goal: Ability to maintain and perform role responsibilities to the fullest extent possible will improve ?Outcome: Progressing ?  ?

## 2022-03-28 NOTE — Evaluation (Signed)
Physical Therapy Evaluation ?Patient Details ?Name: Mackenzie Green ?MRN: [id number] ?DOB: 11/06/1946 ?Today's Date: 03/28/2022 ? ?History of Present Illness ? 76 yo female presenting to ED with fall sustained while hiking resulting in L hip injury. X-ray on 4/4 obtained in triage shows a left femoral neck fracture. S/p L THA on 4/5; posterior hip precautions. PMH including depression with anxiety, Mild cognitive impairment with memory loss, and spinal stenosis at L4-5 level.  ?Clinical Impression ? Patient presents with decreased mobility due to pain, decreased AROM, strength L LE, decreased balance and poor activity tolerance.  Patient will benefit from skilled PT in the acute setting and will likely need follow up STSNF prior to d/c home.  She is interested in [place] if able (reports already on the list to relocate there).  Currently mod to max A for mobility up to chair.  Previously independent living alone and active.    ?   ? ?Recommendations for follow up therapy are one component of a multi-disciplinary discharge planning process, led by the attending physician.  Recommendations may be updated based on patient status, additional functional criteria and insurance authorization. ? ?Follow Up Recommendations Skilled nursing-short term rehab (<3 hours/day) ? ?  ?Assistance Recommended at Discharge Frequent or constant Supervision/Assistance  ?Patient can return home with the following ? A lot of help with walking and/or transfers;A lot of help with bathing/dressing/bathroom;Assistance with cooking/housework;Direct supervision/assist for medications management;Assist for transportation;Help with stairs or ramp for entrance ? ?  ?Equipment Recommendations Rolling walker (2 wheels)  ?Recommendations for Other Services ?    ?  ?Functional Status Assessment Patient has had a recent decline in their functional status and demonstrates the ability to make significant improvements in function in a reasonable and  predictable amount of time.  ? ?  ?Precautions / Restrictions Precautions ?Precautions: Fall;Posterior Hip ?Precaution Booklet Issued: Yes (comment) ?Precaution Comments: Reviewed posterior hip precautions; at end of session, pt able to recall "dont bend too far" ?Restrictions ?Weight Bearing Restrictions: Yes ?LLE Weight Bearing: Weight bearing as tolerated  ? ?  ? ?Mobility ? Bed Mobility ?Overal bed mobility: Needs Assistance ?Bed Mobility: Supine to Sit ?  ?  ?Supine to sit: Max assist, HOB elevated ?  ?  ?General bed mobility comments: cues for using R LE to assist to scoot hips, but needing assist to scoot hips and lift trunk and manage L LE ?  ? ?Transfers ?Overall transfer level: Needs assistance ?Equipment used: Rolling walker (2 wheels) ?Transfers: Sit to/from Stand ?Sit to Stand: Mod assist ?  ?  ?  ?  ?  ?General transfer comment: assist to stand from EOB difficulty due to pain and lifting help with assist for balance, stand step to recliner with L LE internally rotated and flexed throughout, but pt tolerating some weight, assist to bring chair closer and step by step cues for walker and LE's management ?  ? ?Ambulation/Gait ?  ?  ?  ?  ?  ?  ?  ?  ? ?Stairs ?  ?  ?  ?  ?  ? ?Wheelchair Mobility ?  ? ?Modified Rankin (Stroke Patients Only) ?  ? ?  ? ?Balance Overall balance assessment: Needs assistance ?Sitting-balance support: Feet supported ?Sitting balance-Leahy Scale: Poor ?Sitting balance - Comments: UE support leaning to R to off load L hip on EOB ?  ?Standing balance support: Bilateral upper extremity supported ?Standing balance-Leahy Scale: Poor ?Standing balance comment: UE support and min to mod A for  balance ?  ?  ?  ?  ?  ?  ?  ?  ?  ?  ?  ?   ? ? ? ?Pertinent Vitals/Pain Pain Assessment ?Pain Assessment: Faces ?Pain Score: 2  ?Faces Pain Scale: Hurts even more ?Pain Location: With LLE movement ?Pain Descriptors / Indicators: Discomfort, Grimacing ?Pain Intervention(s): Monitored during  session, Repositioned  ? ? ?Home Living Family/patient expects to be discharged to:: Private residence ?Living Arrangements: Alone ?Available Help at Discharge: Family;Neighbor;Available PRN/intermittently ?Type of Home: House ?Home Access: Stairs to enter ?  ?Entrance Stairs-Number of Steps: 1 ?  ?Home Layout: One level ?Home Equipment: BSC/3in1 ?   ?  ?Prior Function Prior Level of Function : Independent/Modified Independent ?  ?  ?  ?  ?  ?  ?  ?ADLs Comments: ADLs and IADLs, enjoys hiking. Retired Engineer, water ?  ? ? ?Hand Dominance  ?   ? ?  ?Extremity/Trunk Assessment  ? Upper Extremity Assessment ?Upper Extremity Assessment: Defer to OT evaluation ?  ? ?Lower Extremity Assessment ?Lower Extremity Assessment: RLE deficits/detail;LLE deficits/detail ?RLE Deficits / Details: AAROM WFL, strength hip flexion 2/5, knee extension 4-/5, ankle DF 4/5 ?LLE Deficits / Details: AAROM limited by pain hip flexion, able to activate quad with cues and assist; ankle AROM WFL ?  ? ?Cervical / Trunk Assessment ?Cervical / Trunk Assessment: Normal  ?Communication  ? Communication: No difficulties  ?Cognition Arousal/Alertness: Awake/alert ?Behavior During Therapy: [facility] for tasks assessed/performed ?Overall Cognitive Status: History of cognitive impairments - at baseline ?  ?  ?  ?  ?  ?  ?  ?  ?  ?  ?  ?  ?  ?  ?  ?  ?  ?  ?  ? ?  ?General Comments General comments (skin integrity, edema, etc.): son present and long discussion about follow up needs ? ?  ?Exercises Total Joint Exercises ?Ankle Circles/Pumps: AROM, Both, 5 reps, Supine ?Quad Sets: AROM, AAROM, Both, 5 reps, Supine ?Heel Slides: AAROM, AROM, Both, 5 reps, Supine ?Hip ABduction/ADduction: AROM, AAROM, Both, 5 reps, Supine  ? ?Assessment/Plan  ?  ?PT Assessment Patient needs continued PT services  ?PT Problem List Decreased strength;Decreased mobility;Decreased activity tolerance;Decreased balance;Decreased knowledge of use of DME;Pain;Decreased knowledge of  precautions ? ?   ?  ?PT Treatment Interventions DME instruction;Therapeutic activities;Gait training;Therapeutic exercise;Patient/family education;Balance training;Functional mobility training   ? ?PT Goals (Current goals can be found in the Care Plan section)  ?Acute Rehab PT Goals ?Patient Stated Goal: to return to independent ?PT Goal Formulation: With patient/family ?Time For Goal Achievement: 04/11/22 ?Potential to Achieve Goals: Good ? ?  ?Frequency Min 3X/week ?  ? ? ?Co-evaluation   ?  ?  ?  ?  ? ? ?  ?AM-PAC PT "6 Clicks" Mobility  ?Outcome Measure Help needed turning from your back to your side while in a flat bed without using bedrails?: Total ?Help needed moving from lying on your back to sitting on the side of a flat bed without using bedrails?: Total ?Help needed moving to and from a bed to a chair (including a wheelchair)?: A Lot ?Help needed standing up from a chair using your arms (e.g., wheelchair or bedside chair)?: A Lot ?Help needed to walk in hospital room?: Total ?Help needed climbing 3-5 steps with a railing? : Total ?6 Click Score: 8 ? ?  ?End of Session Equipment Utilized During Treatment: Gait belt ?Activity Tolerance: Patient limited by pain ?Patient left: in  chair;with call bell/phone within reach;with family/visitor present;with chair alarm set ?  ?PT Visit Diagnosis: Other abnormalities of gait and mobility (R26.89);History of falling (Z91.81);Muscle weakness (generalized) (M62.81);Pain ?Pain - Right/Left: Left ?Pain - part of body: Hip ?  ? ?Time: 1100-1150 ?PT Time Calculation (min) (ACUTE ONLY): 50 min ? ? ?Charges:   PT Evaluation ?$PT Eval Moderate Complexity: 1 Mod ?PT Treatments ?$Therapeutic Exercise: 8-22 mins ?$Therapeutic Activity: 8-22 mins ?  ?   ? ? ?Magda Kiel, PT ?Acute Rehabilitation Services ?TVIFX:[phone] ?[phone] ?03/28/2022 ? ? ?Reginia Naas ?03/28/2022, 3:25 PM ? ?

## 2022-03-28 NOTE — Care Management Important Message (Signed)
Important Message ? ?Patient Details  ?Name: Mackenzie Green ?MRN: [id number] ?Date of Birth: 1946/06/19 ? ? ?Medicare Important Message Given:  Yes ? ? ? ? ?Levada Dy  Akash Winski-Martin ?03/28/2022, 12:59 PM ?

## 2022-03-28 NOTE — Anesthesia Postprocedure Evaluation (Signed)
Anesthesia Post Note ? ?Patient: Mackenzie Green ? ?Procedure(s) Performed: LEFT POSTERIOR TOTAL HIP ARTHROPLASTY (Left: Hip) ? ?  ? ?Patient location during evaluation: PACU ?Anesthesia Type: Spinal ?Level of consciousness: oriented and awake and alert ?Pain management: pain level controlled ?Vital Signs Assessment: post-procedure vital signs reviewed and stable ?Respiratory status: spontaneous breathing, respiratory function stable and patient connected to nasal cannula oxygen ?Cardiovascular status: blood pressure returned to baseline and stable ?Postop Assessment: no headache, no backache and no apparent nausea or vomiting ?Anesthetic complications: no ? ? ?No notable events documented. ?  ?  ?  ?  ? ?Effie Berkshire ? ? ? ? ?

## 2022-03-28 NOTE — Progress Notes (Signed)
?  Inpatient Rehab Admissions Coordinator : ? ?Per therapy recommendations patient was screened for CIR candidacy by Danne Baxter RN MSN. Patient does not appear to demonstrate the medical neccesity for a [facility] /CIR admit nor will [facility] approve for this diagnosis.  I will not place a Rehab Consult. Recommend other Rehab Venues to be pursued. Please contact me with any questions. ? ?Danne Baxter RN MSN ?Admissions Coordinator ?[phone]  ? ?

## 2022-03-29 LAB — HEMOGLOBIN AND HEMATOCRIT, BLOOD
HCT: 32.4 % — ABNORMAL LOW (ref 36.0–46.0)
Hemoglobin: 11.2 g/dL — ABNORMAL LOW (ref 12.0–15.0)

## 2022-03-29 MED ORDER — PROCHLORPERAZINE EDISYLATE 10 MG/2ML IJ SOLN
10.0000 mg | Freq: Once | INTRAMUSCULAR | Status: AC
  Administered 2022-03-29: 10 mg via INTRAVENOUS
  Filled 2022-03-29: qty 2

## 2022-03-29 MED ORDER — BOOST / RESOURCE BREEZE PO LIQD CUSTOM
1.0000 | Freq: Three times a day (TID) | ORAL | Status: DC
  Administered 2022-03-31 (×2): 1 via ORAL

## 2022-03-29 MED ORDER — PROSOURCE PLUS PO LIQD
30.0000 mL | Freq: Two times a day (BID) | ORAL | Status: DC
  Administered 2022-03-31 – 2022-04-01 (×2): 30 mL via ORAL
  Filled 2022-03-29 (×3): qty 30

## 2022-03-29 NOTE — Plan of Care (Signed)

## 2022-03-29 NOTE — Progress Notes (Signed)
?PROGRESS NOTE ? ?THERESIA PREE  JOI:[id number] DOB: August 20, 1946 DOA: 03/26/2022 ?PCP: Aretta Nip, MD  ? ?Brief Narrative: ?Patient is a 76 year old female with no significant medical problems except for iron deficiency anemia, mild cognitive impairment who presented to the emergency department with complaint of left hip pain after she fell on her left hip while she was hiking.  X-ray of the left hip showed displaced left femoral neck fracture.  Orthopedics consulted,underwent total left  hip arthroplasty on 4/5.PT/OT evaluation done, recommended skilled nursing facility on discharge.  Medically stable for discharge whenever possible. ? ?Assessment & Plan: ? ?Principal Problem: ?  Hip fracture ([place]) ?Active Problems: ?  Mild cognitive impairment with memory loss ?  Depression with anxiety ?  Spinal stenosis at L4-L5 level ? ? ?Left hip fracture: [place] on the left side while hiking.  No history of head injury. X-ray of the left hip showed displaced left femoral neck fracture.  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  Continue supportive care, pain management.  DVT prophylaxis with aspirin BID. ?Patient is able to ambulate normally on her usual days, lives alone. ?Continue bowel regimen ?PT/OT recommended skilled nursing facility on discharge.  TOC consulted ? ?Back pain: CT lumbar spine showed broad-based disc protrusion asymmetric left at L4-5 contributing to moderate spinal and bilateral lateral recess stenosis. continue supportive care, pain management.  Follow-up with neurosurgery as an outpatient.  Back pain has significantly improved. ? ?History of iron deficiency: Takes iron supplementation at home. ? ? ?Nutrition Problem: Increased nutrient needs ?Etiology: hip fracture ?  ? ?DVT prophylaxis:SCDs Start: 03/26/22 1627 ? ? ?  Code Status: Full Code ? ?Family Communication: Discussed with son at bedside on 4/7 ?Patient status: Inpatient ? ?Patient is from : Home ? ?Anticipated discharge  to:  skilled nursing facility ? ?Estimated DC date: Whenever bed is available ? ?Consultants: Orthopedics ? ?Procedures: Plan for ORIF ? ?Antimicrobials:  ?Anti-infectives (From admission, onward)  ? ? Start     Dose/Rate Route Frequency Ordered Stop  ? 03/28/22 0745  ceFAZolin (ANCEF) IVPB 2g/100 mL premix       ? 2 g ?200 mL/hr over 30 Minutes Intravenous Every 8 hours 03/28/22 0654 03/28/22 1542  ? 03/27/22 1430  ceFAZolin (ANCEF) IVPB 2g/100 mL premix       ? 2 g ?200 mL/hr over 30 Minutes Intravenous On call to O.R. 03/27/22 0036 03/27/22 1720  ? 03/27/22 0600  ceFAZolin (ANCEF) IVPB 2g/100 mL premix  Status:  Discontinued       ? 2 g ?200 mL/hr over 30 Minutes Intravenous On call to O.R. 03/27/22 0030 03/27/22 0036  ? ?  ? ? ?Subjective: ?Patient seen and examined at the bedside this morning.  Hemodynamically stable.  Pain well controlled.  Son at the bedside.  Patient and family frustrated because of inability to go to CIR.  Patient and family agreeable for skilled nursing  facility  ? ?Objective: ?Vitals:  ? 03/28/22 1512 03/28/22 1934 03/29/22 0546 03/29/22 0811  ?BP: (!) 103/45 (!) 96/48 (!) 111/57 128/70  ?Pulse: 74 74 82 88  ?Resp: '16 17 17   '$ ?Temp: (!) 97.5 ?F (36.4 ?C) 98.3 ?F (36.8 ?C) 98.6 ?F (37 ?C) 98.3 ?F (36.8 ?C)  ?TempSrc: Oral     ?SpO2: 99% 99% 100% 99%  ?Weight:      ?Height:      ? ? ?Intake/Output Summary (Last 24 hours) at 03/29/2022 1030 ?Last data filed at 03/29/2022 0600 ?Johney Maine  per 24 hour  ?Intake 920 ml  ?Output 850 ml  ?Net 70 ml  ? ?Filed Weights  ? 03/26/22 1217 03/27/22 1618  ?Weight: 45.8 kg 45.8 kg  ? ? ?Examination: ? ?General exam: Overall comfortable, not in distress ?HEENT: PERRL ?Respiratory system:  no wheezes or crackles  ?Cardiovascular system: S1 & S2 heard, RRR.  ?Gastrointestinal system: Abdomen is nondistended, soft and nontender. ?Central nervous system: Alert and oriented ?Extremities: No edema, no clubbing ,no cyanosis.  Surgical wound on the left hip ?Skin: No  rashes, no ulcers,no icterus   ? ? ?Data Reviewed: I have personally reviewed following labs and imaging studies ? ?CBC: ?Recent Labs  ?Lab 03/26/22 ?1424 03/27/22 ?3818 03/28/22 ?0140 03/29/22 ?2993  ?WBC 10.2 5.2 7.9  --   ?NEUTROABS 9.3*  --  7.0  --   ?HGB 13.0 12.6 12.6 11.2*  ?HCT 39.0 37.2 37.0 32.4*  ?MCV 105.4* 103.6* 102.2*  --   ?PLT 172 156 142*  --   ? ?Basic Metabolic Panel: ?Recent Labs  ?Lab 03/26/22 ?1424 03/27/22 ?7169 03/28/22 ?0140  ?NA 137 134* 132*  ?K 3.8 4.0 4.2  ?CL 103 102 103  ?CO2 '25 26 24  '$ ?GLUCOSE 116* 100* 139*  ?BUN '10 10 9  '$ ?CREATININE 0.88 0.77 0.84  ?CALCIUM 8.7* 8.3* 8.2*  ? ? ? ?No results found for this or any previous visit (from the past 240 hour(s)).  ? ?Radiology Studies: ?DG HIP PORT UNILAT WITH PELVIS 1V LEFT ? ?Result Date: 03/27/2022 ?CLINICAL DATA:  Intraoperative left hip for left total hip arthroplasty. EXAM: DG HIP (WITH OR WITHOUT PELVIS) 1V PORT LEFT COMPARISON:  None. FINDINGS: A single portable cross-table lateral view of the left hip is obtained demonstrating interval placement of a left hip arthroplasty using non cemented components. Screw fixation of the acetabular cup. Components appear well seated. Soft tissue gas is consistent with recent surgery. IMPRESSION: Left total hip arthroplasty.  No acute complication is indicated. Electronically Signed   By: Lucienne Capers M.D.   On: 03/27/2022 19:28  ? ?DG HIP UNILAT W OR W/O PELVIS 2-3 VIEWS LEFT ? ?Result Date: 03/27/2022 ?CLINICAL DATA:  Postoperative. EXAM: DG HIP (WITH OR WITHOUT PELVIS) 2-3V LEFT COMPARISON:  03/27/2022. FINDINGS: There is a left hip arthroplasty in anatomic alignment. No evidence for fracture. There is surrounding soft tissue swelling and air compatible with recent surgery. IMPRESSION: 1. Left hip arthroplasty in anatomic alignment with recent postoperative changes. Electronically Signed   By: Ronney Asters M.D.   On: 03/27/2022 20:39   ? ?Scheduled Meds: ? acetaminophen  1,000 mg Oral Q8H   ? vitamin C  500 mg Oral Daily  ? aspirin EC  81 mg Oral BID  ? cholecalciferol  1,000 Units Oral Daily  ? docusate sodium  100 mg Oral BID  ? feeding supplement  237 mL Oral BID BM  ? ferrous sulfate  325 mg Oral Daily  ? ketorolac  7.5 mg Intravenous Q8H  ? multivitamin with minerals  1 tablet Oral Daily  ? pantoprazole  40 mg Oral Daily  ? polyethylene glycol  17 g Oral Daily  ? ?Continuous Infusions: ? methocarbamol (ROBAXIN) IV    ? ? ? LOS: 3 days  ? ?Shelly Coss, MD ?[facility] ?P4/06/2022, 10:30 AM   ?

## 2022-03-29 NOTE — TOC Progression Note (Addendum)
Transition of Care (TOC) - Progression Note  ? ? ?Patient Details  ?Name: Mackenzie Green ?MRN: [id number] ?Date of Birth: [birth year] ? ?Transition of Care (TOC) CM/SW Contact  ?Joanne Chars, LCSW ?Phone Number: ?03/29/2022, 10:41 AM ? ?Clinical Narrative:   CIR not option.  CSW spoke with pt and son Thurmond Butts, discussed this, Thurmond Butts had some additional questions regarding why pt not a candidate for CIR and whether coding or writing the notes differently could make any difference.  CSW discussed SNF option, Pt is on wait list at [facility] and would like to see if she could get in there for SNF.  They are agreeable to send out referral to other options as well, choice document given.  ? ?CSW attempted to contact Katie at [facility], [place], unclear if she is working on Good Friday.   ? ?Referral sent out in hub for SNF. ?PASSR went to level two, need docs uploaded in [facility].   ? ?1230: message from [facility]: no SNF beds available. ? ?1320: bed offers presented to pt and son.   ?They are requesting responses from  ?Ola Spurr from [facility], no beds ?[facility]: response from [facility]-they are full  ?[facility] ?[facility].   ? ?All were contacted with requests to review referral.  ? ? ? ?Expected Discharge Plan: [place] ?Barriers to Discharge: Continued Medical Work up, Other (must enter comment) (pt asking for possible CIR admit, prefers to SNF) ? ?Expected Discharge Plan and Services ?Expected Discharge Plan: [place] ?In-house Referral: Clinical Social Work ?  ?Post Acute Care Choice: IP Rehab ?Living arrangements for the past 2 months: [place] ?                ?  ?  ?  ?  ?  ?  ?  ?  ?  ?  ? ? ?Social Determinants of Health (SDOH) Interventions ?  ? ?Readmission Risk Interventions ?   ? View : No data to display.  ?  ?  ?  ? ? ?

## 2022-03-29 NOTE — Progress Notes (Signed)
Mobility Specialist: Progress Note ? ? 03/29/22 1724  ?Mobility  ?Activity Ambulated with assistance to bathroom  ?Level of Assistance Minimal assist, patient does 75% or more  ?Assistive Device Front wheel walker  ?LLE Weight Bearing WBAT  ?Distance Ambulated (ft) 40 ft ?(20'x2)  ?Activity Response Tolerated well  ?$Mobility charge 1 Mobility  ? ?Pt received in bed and requesting to use BR. Required minA for bed mobility as well as to stand. Pt able to 2/3 precautions during session. To BR, void successful. Frequent verbal cues required for RW direction and management. Pt back to bed after BR per request with call bell at her side and family present in the room.  ? ?Harrell Gave P[place] ?Mobility Specialist ?Mobility Specialist [place]: [phone] ?Mobility Specialist [place]: [phone] ? ?

## 2022-03-29 NOTE — Progress Notes (Signed)
? ? ? ?  Subjective: ? ?Patient reports pain has improved. Working with TOC to find a rehab facility  ? ?Objective:  ? ?VITALS:   ?Vitals:  ? 03/28/22 1512 03/28/22 1934 03/29/22 0546 03/29/22 0811  ?BP: (!) 103/45 (!) 96/48 (!) 111/57 128/70  ?Pulse: 74 74 82 88  ?Resp: '16 17 17   '$ ?Temp: (!) 97.5 ?F (36.4 ?C) 98.3 ?F (36.8 ?C) 98.6 ?F (37 ?C) 98.3 ?F (36.8 ?C)  ?TempSrc: Oral     ?SpO2: 99% 99% 100% 99%  ?Weight:      ?Height:      ? ? ?Sensation intact distally ?Intact pulses distally ?Dorsiflexion/Plantar flexion intact ?Incision: dressing C/D/I ?No cellulitis present ?Compartment soft ?  ? ?Lab Results  ?Component Value Date  ? WBC 7.9 03/28/2022  ? HGB 11.2 (L) 03/29/2022  ? HCT 32.4 (L) 03/29/2022  ? MCV 102.2 (H) 03/28/2022  ? PLT 142 (L) 03/28/2022  ? ?BMET ?   ?Component Value Date/Time  ? NA 132 (L) 03/28/2022 0140  ? K 4.2 03/28/2022 0140  ? CL 103 03/28/2022 0140  ? CO2 24 03/28/2022 0140  ? GLUCOSE 139 (H) 03/28/2022 0140  ? BUN 9 03/28/2022 0140  ? CREATININE 0.84 03/28/2022 0140  ? CALCIUM 8.2 (L) 03/28/2022 0140  ? GFRNONAA >60 03/28/2022 0140  ? ? ? ? ?Xray: Postop x-rays show left total hip arthroplasty in good position without adverse features.  Leg lengths appear restored. ? ?Assessment/Plan: ?2 Days Post-Op  ? ?Principal Problem: ?  Hip fracture ([place]) ?Active Problems: ?  Mild cognitive impairment with memory loss ?  Depression with anxiety ?  Spinal stenosis at L4-L5 level ? ?L THA for femoral neck fracture on 4/5 ? ?Post op recs: ?WB: WBAT LLE, posterior hip precautions x6 weeks ?Abx: ancef x23 hours post op ?Imaging: PACU pelvis Xray ?Dressing: Aquacell, keep intact until follow up ?DVT prophylaxis: Aspirin 81BID starting POD1 ?Follow up: 2 weeks after surgery for a wound check with [facility]ins at [facility].  ?Address: [address] [place], [place], [place] [address]  ?Office Phone: [phone] ? ? ? ?Mackenzie Green Mackenzie Green ?03/29/2022, 3:18 PM ? ? ? ? ?Contact information:    ? ?After hours and holidays please check [email or web address] for group call information for [facility] ? ?  ?

## 2022-03-29 NOTE — Progress Notes (Signed)
Physical Therapy Treatment ?Patient Details ?Name: Mackenzie Green ?MRN: [id number] ?DOB: [birth year] ?Today's Date: 03/29/2022 ? ? ?History of Present Illness 76 yo female presenting to ED with fall sustained while hiking resulting in L hip injury. X-ray on 4/4 obtained in triage shows a left femoral neck fracture. S/p L THA on 4/5; posterior hip precautions. PMH including depression with anxiety, Mild cognitive impairment with memory loss, and spinal stenosis at L4-5 level. ? ?  ?PT Comments  ? ? Pt received reclined in bed and agreeable to session with good progress towards acute goals with increased tolerance for mobility and progression of ambulation. Pt able to self mobilize LLE with strap with min assist to EOB, requiring mod assist to elevate trunk and slide hips to edge. Pt with tendency for posterior lean when coming to standing requiring mod assist to correct. Pt needing mod assist throughout ambulation for heavy cueing for LE/RW sequencing, management and safety, elevation of trunk and narrow base of support, and foot flat, pt able to correct and intermittently maintain. Pt able to recall 2/3 hip precautions and 3rd with prompting and good compliance throughout. Pt son present throughout session, very supportive. Current plan remains appropriate to address deficits and maximize functional independence and decrease caregiver burden. Pt continues to benefit from skilled PT services to progress toward functional mobility goals.  ? ?  ?Recommendations for follow up therapy are one component of a multi-disciplinary discharge planning process, led by the attending physician.  Recommendations may be updated based on patient status, additional functional criteria and insurance authorization. ? ?Follow Up Recommendations ? Skilled nursing-short term rehab (<3 hours/day) ?  ?  ?Assistance Recommended at Discharge Frequent or constant Supervision/Assistance  ?Patient can return home with the following A lot of help with  walking and/or transfers;A lot of help with bathing/dressing/bathroom;Assistance with cooking/housework;Direct supervision/assist for medications management;Assist for transportation;Help with stairs or ramp for entrance ?  ?Equipment Recommendations ? Rolling walker (2 wheels)  ?  ?Recommendations for Other Services   ? ? ?  ?Precautions / Restrictions Precautions ?Precautions: Fall;Posterior Hip ?Precaution Booklet Issued: Yes (comment) ?Precaution Comments: Reviewed posterior hip precautions; pt able to recall 2/3, with prompting able to recall 3rd ?Restrictions ?Weight Bearing Restrictions: Yes ?LLE Weight Bearing: Weight bearing as tolerated  ?  ? ?Mobility ? Bed Mobility ?Overal bed mobility: Needs Assistance ?Bed Mobility: Supine to Sit ?  ?  ?Supine to sit: Mod assist, Min assist, HOB elevated ?  ?  ?General bed mobility comments: min assist to self mobilize LLE to EOB with use of strap, mod assist toelevate trunk and slide hips to EOB with use of bedpad ?  ? ?Transfers ?Overall transfer level: Needs assistance ?Equipment used: Rolling walker (2 wheels) ?Transfers: Sit to/from Stand, Bed to chair/wheelchair/BSC ?Sit to Stand: Min assist, Mod assist ?  ?Step pivot transfers: Min assist ?  ?  ?  ?General transfer comment: assist to power up from EOB/BSC and shift weight anteriorly with pt tendency for posterior lean, pt able to correct and maintain with tactile cues, heavy verbal cues to advance LLE forwward before coming to sit in all instances. ?  ? ?Ambulation/Gait ?Ambulation/Gait assistance: Mod assist ?Gait Distance (Feet): 20 Feet ?Assistive device: Rolling walker (2 wheels) ?Gait Pattern/deviations: Step-to pattern, Trunk flexed, Narrow base of support, Decreased stride length, Decreased stance time - left ?Gait velocity: decr ?  ?  ?General Gait Details: slow antalgic gait, mod assist for heavy verbal and tactile cues for LE sequencing and  RW management, proximity and safety. Pt with narrow base of  support, and flexed trunk with ability to correct and intermittently maintain with cues. cues needed at start to not advance LLE with hand and for foot flat during stance phase. Pt distance limited secondary to fatigue and decreased activity tolerance. ? ? ?Stairs ?  ?  ?  ?  ?  ? ? ?Wheelchair Mobility ?  ? ?Modified Rankin (Stroke Patients Only) ?  ? ? ?  ?Balance Overall balance assessment: Needs assistance ?Sitting-balance support: Feet supported ?Sitting balance-Leahy Scale: Fair ?  ?  ?Standing balance support: Bilateral upper extremity supported ?Standing balance-Leahy Scale: Poor ?Standing balance comment: heavy reliance on RW ?  ?  ?  ?  ?  ?  ?  ?  ?  ?  ?  ?  ? ?  ?Cognition Arousal/Alertness: Awake/alert ?Behavior During Therapy: [facility] for tasks assessed/performed ?Overall Cognitive Status: Impaired/Different from baseline ?Area of Impairment: Following commands, Problem solving, Awareness ?  ?  ?  ?  ?  ?  ?  ?  ?  ?  ?  ?Following Commands: Follows one step commands with increased time ?  ?  ?Problem Solving: Slow processing, Requires verbal cues, Requires tactile cues ?General Comments: Mild cognitive impairment with memory loss at baseline. Currently requiring increased cues for recall of precautions and education. Needing increased time and verbal/tactile cues for sequencing of mobility. Decreased awareness. Oriented and motivated. ?  ?  ? ?  ?Exercises Total Joint Exercises ?Ankle Circles/Pumps:  (long sitting) ?General Exercises - Lower Extremity ?Ankle Circles/Pumps: AROM, Both, 10 reps, Seated (long sitting) ?Gluteal Sets: AROM, Both, 5 reps, Seated (long sitting) ?Heel Slides: AROM, AAROM, Left, 5 reps, Seated (long sitting) ?Hip ABduction/ADduction: AROM, AAROM, Left, 10 reps, Seated (long sitting) ? ?  ?General Comments General comments (skin integrity, edema, etc.): VSS on RA ?  ?  ? ?Pertinent Vitals/Pain Pain Assessment ?Pain Assessment: Faces ?Faces Pain Scale: Hurts little more ?Pain  Location: With LLE movement ?Pain Descriptors / Indicators: Grimacing, Guarding ?Pain Intervention(s): Limited activity within patient's tolerance, Monitored during session, Repositioned  ? ? ?Home Living   ?  ?  ?  ?  ?  ?  ?  ?  ?  ?   ?  ?Prior Function    ?  ?  ?   ? ?PT Goals (current goals can now be found in the care plan section) Acute Rehab PT Goals ?PT Goal Formulation: With patient/family ?Time For Goal Achievement: 04/11/22 ? ?  ?Frequency ? ? ? Min 3X/week ? ? ? ?  ?PT Plan    ? ? ?Co-evaluation   ?  ?  ?  ?  ? ?  ?AM-PAC PT "6 Clicks" Mobility   ?Outcome Measure ? Help needed turning from your back to your side while in a flat bed without using bedrails?: A Lot ?Help needed moving from lying on your back to sitting on the side of a flat bed without using bedrails?: A Lot ?Help needed moving to and from a bed to a chair (including a wheelchair)?: A Lot ?Help needed standing up from a chair using your arms (e.g., wheelchair or bedside chair)?: A Little ?Help needed to walk in hospital room?: A Lot ?Help needed climbing 3-5 steps with a railing? : Total ?6 Click Score: 12 ? ?  ?End of Session Equipment Utilized During Treatment: Gait belt ?Activity Tolerance: Patient tolerated treatment well ?Patient left: in chair;with call bell/phone within  reach;with chair alarm set;with family/visitor present ?Nurse Communication: Mobility status ?PT Visit Diagnosis: Other abnormalities of gait and mobility (R26.89);History of falling (Z91.81);Muscle weakness (generalized) (M62.81);Pain ?Pain - Right/Left: Left ?Pain - part of body: Hip ?  ? ? ?Time: 9532-0233 ?PT Time Calculation (min) (ACUTE ONLY): 36 min ? ?Charges:  $Gait Training: 8-22 mins ?$Therapeutic Exercise: 8-22 mins          ?          ? Audry Riles. PTA ?Acute Rehabilitation Services ?Office: [phone] ? ? ?Betsey Holiday Ingram Onnen ?03/29/2022, 10:34 AM ? ?

## 2022-03-29 NOTE — Progress Notes (Signed)
? ? ?  RE:  Mackenzie Green       ?Date of Birth: 09-15-2046      ?Date:   03/29/22     ? ? ?To Whom It May Concern: ? ?Please be advised that the above-named patient will require a short-term nursing home stay - anticipated 30 days or less for rehabilitation and strengthening.  The plan is for return home. ? ? ?              ?MD signature ? ?              ?Date ?

## 2022-03-29 NOTE — Progress Notes (Signed)
Pt able to pivot to [facility]. Pt complains of little pain.   ?

## 2022-03-29 NOTE — [place] FL2 (Signed)
?[place] MEDICAID FL2 LEVEL OF CARE SCREENING TOOL  ?  ? ?IDENTIFICATION  ?Patient Name: ?Mackenzie Green Birthdate: 12/10/1946 Sex: female Admission Date (Current Location): ?03/26/2022  ?[place] and [place] Number: ? [place] ?  Facility and Address:  ?[facility], [place] [address], [place], [place] [address] ?     Provider Number: ?[id number]  ?Attending Physician Name and Address:  ?Shelly Coss, MD ? Relative Name and Phone Number:  ?McCollum,Justin Son [phone] ?   ?Current Level of Care: ?Hospital Recommended Level of Care: ?[place] Prior Approval Number: ?  ? ?Date Approved/Denied: ?  PASRR Number: ?  ? ?Discharge Plan: ?SNF ?  ? ?Current Diagnoses: ?Patient Active Problem List  ? Diagnosis Date Noted  ? Hip fracture ([place]) 03/26/2022  ? Mild cognitive impairment with memory loss 03/26/2022  ? Depression with anxiety 03/26/2022  ? Spinal stenosis at L4-L5 level 03/26/2022  ? Neutropenia ([place]) 08/27/2016  ? Weight loss 07/31/2012  ? Hypokalemia 07/31/2012  ? Hyponatremia 07/30/2012  ? Hypoglycemia 07/30/2012  ? ? ?Orientation RESPIRATION BLADDER Height & Weight   ?  ?Self, Time, Situation, Place ? Normal Continent Weight: 100 lb 15.5 oz (45.8 kg) ?Height:  '5\' 4"'$  (162.6 cm)  ?BEHAVIORAL SYMPTOMS/MOOD NEUROLOGICAL BOWEL NUTRITION STATUS  ?    Continent Diet (see discharge summary)  ?AMBULATORY STATUS COMMUNICATION OF NEEDS Skin   ?Total Care Verbally Surgical wounds ?  ?  ?  ?    ?     ?     ? ? ?Personal Care Assistance Level of Assistance  ?Bathing, Feeding, Dressing Bathing Assistance: Limited assistance ?Feeding assistance: Limited assistance ?Dressing Assistance: Maximum assistance ?   ? ?Functional Limitations Info  ?Sight, Hearing, Speech Sight Info: Adequate ?Hearing Info: Adequate ?Speech Info: Adequate  ? ? ?SPECIAL CARE FACTORS FREQUENCY  ?PT (By licensed PT), OT (By licensed OT)   ?  ?PT Frequency: 5x week ?OT Frequency: 5x week ?  ?  ?  ?    ? ? ?Contractures Contractures Info: Not present  ? ? 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  ?  ?  ?   ? ?Current Medications (03/29/2022):  This is the current hospital active medication list ?Current Facility-Administered Medications  ?Medication Dose Route Frequency Provider Last Rate Last Admin  ? acetaminophen (TYLENOL) tablet 1,000 mg  1,000 mg Oral Q8H Willaim Sheng, MD   1,000 mg at 03/29/22 1028  ? ascorbic acid (VITAMIN C) tablet 500 mg  500 mg Oral Daily Adhikari, Amrit, MD      ? aspirin EC tablet 81 mg  81 mg Oral BID Willaim Sheng, MD   81 mg at 03/29/22 1028  ? bisacodyl (DULCOLAX) EC tablet 5 mg  5 mg Oral Daily PRN Willaim Sheng, MD      ? cholecalciferol (VITAMIN D3) tablet 1,000 Units  1,000 Units Oral Daily Willaim Sheng, MD   1,000 Units at 03/28/22 1001  ? clonazePAM (KLONOPIN) tablet 0.5 mg  0.5 mg Oral BID PRN Shelly Coss, MD      ? docusate sodium (COLACE) capsule 100 mg  100 mg Oral BID Willaim Sheng, MD   100 mg at 03/29/22 1028  ? feeding supplement (ENSURE ENLIVE / ENSURE PLUS) liquid 237 mL  237 mL Oral BID BM Adhikari, Amrit, MD      ? ferrous sulfate tablet 325 mg  325 mg  Oral Daily Shelly Coss, MD      ? ketorolac (TORADOL) 15 MG/ML injection 7.5 mg  7.5 mg Intravenous Q8H Willaim Sheng, MD   7.5 mg at 03/29/22 0559  ? methocarbamol (ROBAXIN) tablet 500 mg  500 mg Oral Q6H PRN Willaim Sheng, MD   500 mg at 03/27/22 0945  ? Or  ? methocarbamol (ROBAXIN) 500 mg in dextrose 5 % 50 mL IVPB  500 mg Intravenous Q6H PRN Willaim Sheng, MD      ? morphine (PF) 2 MG/ML injection 2 mg  2 mg Intravenous Q2H PRN Willaim Sheng, MD   2 mg at 03/28/22 3818  ? multivitamin with minerals tablet 1 tablet  1 tablet Oral Daily Adhikari, Amrit, MD      ? oxyCODONE (Oxy IR/ROXICODONE) immediate release tablet 5-10 mg  5-10 mg Oral Q4H PRN Willaim Sheng, MD    10 mg at 03/28/22 1325  ? pantoprazole (PROTONIX) EC tablet 40 mg  40 mg Oral Daily Willaim Sheng, MD   40 mg at 03/28/22 1000  ? polyethylene glycol (MIRALAX / GLYCOLAX) packet 17 g  17 g Oral Daily Shelly Coss, MD      ? ? ? ?Discharge Medications: ?Please see discharge summary for a list of discharge medications. ? ?Relevant Imaging Results: ? ?Relevant Lab Results: ? ? ?Additional Information ?SSN: [id number].  Pt is vaccinated for covid with 2 boosters. ? ?Joanne Chars, LCSW ? ? ? ? ?

## 2022-03-30 MED ORDER — OXYCODONE HCL 5 MG PO TABS: ORAL_TABLET | ORAL | 0 refills | Status: DC

## 2022-03-30 MED ORDER — ASPIRIN EC 81 MG PO TBEC: 81.0000 mg | DELAYED_RELEASE_TABLET | Freq: Two times a day (BID) | ORAL | 0 refills | Status: AC

## 2022-03-30 NOTE — Progress Notes (Signed)
?PROGRESS NOTE ? ?Mackenzie Green  KLK:[id number] DOB: 1946-11-17 DOA: 03/26/2022 ?PCP: Aretta Nip, MD  ? ?Brief Narrative: ?Patient is a 76 year old female with no significant medical problems except for iron deficiency anemia, mild cognitive impairment who presented to the emergency department with complaint of left hip pain after she fell on her left hip while she was hiking.  X-ray of the left hip showed displaced left femoral neck fracture.  Orthopedics consulted,underwent total left  hip arthroplasty on 4/5.PT/OT evaluation done, recommended skilled nursing facility on discharge.  Medically stable for discharge whenever possible. ? ?Assessment & Plan: ? ?Principal Problem: ?  Hip fracture ([place]) ?Active Problems: ?  Mild cognitive impairment with memory loss ?  Depression with anxiety ?  Spinal stenosis at L4-L5 level ? ? ?Left hip fracture: [place] on the left side while hiking.  No history of head injury. X-ray of the left hip showed displaced left femoral neck fracture.  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  Continue supportive care, pain management.  DVT prophylaxis with aspirin BID. ?Patient is able to ambulate normally on her usual days, lives alone. ?Continue bowel regimen ?PT/OT recommended skilled nursing facility on discharge.  TOC consulted ? ?Back pain: CT lumbar spine showed broad-based disc protrusion asymmetric left at L4-5 contributing to moderate spinal and bilateral lateral recess stenosis. continue supportive care, pain management.  Follow-up with neurosurgery as an outpatient.  Back pain has significantly improved. ? ?History of iron deficiency: Takes iron supplementation at home. ? ? ?Nutrition Problem: Increased nutrient needs ?Etiology: hip fracture ?  ? ?DVT prophylaxis:SCDs Start: 03/26/22 1627 ? ? ?  Code Status: Full Code ? ?Family Communication: Discussed with son at bedside on 4/7 ?Patient status: Inpatient ? ?Patient is from : Home ? ?Anticipated discharge  to:  skilled nursing facility ? ?Estimated DC date: Whenever bed is available ? ?Consultants: Orthopedics ? ?Procedures: Plan for ORIF ? ?Antimicrobials:  ?Anti-infectives (From admission, onward)  ? ? Start     Dose/Rate Route Frequency Ordered Stop  ? 03/28/22 0745  ceFAZolin (ANCEF) IVPB 2g/100 mL premix       ? 2 g ?200 mL/hr over 30 Minutes Intravenous Every 8 hours 03/28/22 0654 03/28/22 1542  ? 03/27/22 1430  ceFAZolin (ANCEF) IVPB 2g/100 mL premix       ? 2 g ?200 mL/hr over 30 Minutes Intravenous On call to O.R. 03/27/22 0036 03/27/22 1720  ? 03/27/22 0600  ceFAZolin (ANCEF) IVPB 2g/100 mL premix  Status:  Discontinued       ? 2 g ?200 mL/hr over 30 Minutes Intravenous On call to O.R. 03/27/22 0030 03/27/22 0036  ? ?  ? ? ?Subjective: ?Patient seen and examined at the bedside this morning.  Hemodynamically stable without any complaints. ? ?Objective: ?Vitals:  ? 03/29/22 0811 03/29/22 2149 03/30/22 0600 03/30/22 0729  ?BP: 128/70 (!) 106/58 104/62 119/65  ?Pulse: 88 91 79 87  ?Resp:  '14 15 17  '$ ?Temp: 98.3 ?F (36.8 ?C) 99.1 ?F (37.3 ?C) 98.8 ?F (37.1 ?C) 98.8 ?F (37.1 ?C)  ?TempSrc:  Oral Axillary Oral  ?SpO2: 99% 96% 98% 97%  ?Weight:      ?Height:      ? ? ?Intake/Output Summary (Last 24 hours) at 03/30/2022 1121 ?Last data filed at 03/30/2022 0600 ?Gross per 24 hour  ?Intake 360 ml  ?Output --  ?Net 360 ml  ? ?Filed Weights  ? 03/26/22 1217 03/27/22 1618  ?Weight: 45.8 kg 45.8 kg  ? ? ?  Examination: ? ?General exam: Overall comfortable, not in distress ?HEENT: PERRL ?Respiratory system:  no wheezes or crackles  ?Cardiovascular system: S1 & S2 heard, RRR.  ?Gastrointestinal system: Abdomen is nondistended, soft and nontender. ?Central nervous system: Alert and oriented ?Extremities: No edema, no clubbing ,no cyanosis, surgical wound on the left hip ?Skin: No rashes, no ulcers,no icterus     ? ? ?Data Reviewed: I have personally reviewed following labs and imaging studies ? ?CBC: ?Recent Labs  ?Lab  03/26/22 ?1424 03/27/22 ?0086 03/28/22 ?0140 03/29/22 ?7619  ?WBC 10.2 5.2 7.9  --   ?NEUTROABS 9.3*  --  7.0  --   ?HGB 13.0 12.6 12.6 11.2*  ?HCT 39.0 37.2 37.0 32.4*  ?MCV 105.4* 103.6* 102.2*  --   ?PLT 172 156 142*  --   ? ?Basic Metabolic Panel: ?Recent Labs  ?Lab 03/26/22 ?1424 03/27/22 ?5093 03/28/22 ?0140  ?NA 137 134* 132*  ?K 3.8 4.0 4.2  ?CL 103 102 103  ?CO2 '25 26 24  '$ ?GLUCOSE 116* 100* 139*  ?BUN '10 10 9  '$ ?CREATININE 0.88 0.77 0.84  ?CALCIUM 8.7* 8.3* 8.2*  ? ? ? ?No results found for this or any previous visit (from the past 240 hour(s)).  ? ?Radiology Studies: ?No results found. ? ?Scheduled Meds: ? (feeding supplement) PROSource Plus  30 mL Oral BID BM  ? acetaminophen  1,000 mg Oral Q8H  ? vitamin C  500 mg Oral Daily  ? aspirin EC  81 mg Oral BID  ? cholecalciferol  1,000 Units Oral Daily  ? docusate sodium  100 mg Oral BID  ? feeding supplement  1 Container Oral TID BM  ? ferrous sulfate  325 mg Oral Daily  ? multivitamin with minerals  1 tablet Oral Daily  ? pantoprazole  40 mg Oral Daily  ? polyethylene glycol  17 g Oral Daily  ? ?Continuous Infusions: ? methocarbamol (ROBAXIN) IV    ? ? ? LOS: 4 days  ? ?Shelly Coss, MD ?[facility] ?P4/07/2022, 11:21 AM   ?

## 2022-03-30 NOTE — Progress Notes (Signed)
Physical Therapy Treatment ?Patient Details ?Name: Mackenzie Green ?MRN: [id number] ?DOB: [birth year] ?Today's Date: 03/30/2022 ? ? ?History of Present Illness 76 yo female presenting to ED with fall sustained while hiking resulting in L hip injury. X-ray on 4/4 obtained in triage shows a left femoral neck fracture. S/p L THA on 4/5; posterior hip precautions. PMH including depression with anxiety, Mild cognitive impairment with memory loss, and spinal stenosis at L4-5 level. ? ?  ?PT Comments  ? ? Pt is progressing well towards goals. Gait remains slow and guarded but pt was able to improve gait quality with multimodal cues. Son present during session and is agreeable to SNF placement. Current plan remains appropriate. Will continue to follow acutely.  ?   ?Recommendations for follow up therapy are one component of a multi-disciplinary discharge planning process, led by the attending physician.  Recommendations may be updated based on patient status, additional functional criteria and insurance authorization. ? ?Follow Up Recommendations ? Skilled nursing-short term rehab (<3 hours/day) ?  ?  ?Assistance Recommended at Discharge Frequent or constant Supervision/Assistance  ?Patient can return home with the following A lot of help with walking and/or transfers;A lot of help with bathing/dressing/bathroom;Assistance with cooking/housework;Direct supervision/assist for medications management;Assist for transportation;Help with stairs or ramp for entrance ?  ?Equipment Recommendations ? Rolling walker (2 wheels)  ?  ?Recommendations for Other Services   ? ? ?  ?Precautions / Restrictions Precautions ?Precautions: Fall;Posterior Hip ?Precaution Booklet Issued: Yes (comment) ?Precaution Comments: Reviewed posterior hip precautions; pt able to recall 2/3, with prompting able to recall 3rd ?Restrictions ?Weight Bearing Restrictions: Yes ?LLE Weight Bearing: Weight bearing as tolerated  ?  ? ?Mobility ? Bed Mobility ?Overal bed  mobility: Needs Assistance ?  ?  ?  ?  ?  ?  ?General bed mobility comments: in chair on arrival ?  ? ?Transfers ?Overall transfer level: Needs assistance ?Equipment used: Rolling walker (2 wheels) ?Transfers: Sit to/from Stand ?Sit to Stand: Min assist, Mod assist ?  ?  ?  ?  ?  ?General transfer comment: min A to power up with significantly increased time and effort. Cues for hand placement and to scoot forward. Once standing mod A required to transition hands from recliner chair to RW. ?  ? ?Ambulation/Gait ?Ambulation/Gait assistance: Mod assist, Min assist ?Gait Distance (Feet): 30 Feet ?Assistive device: Rolling walker (2 wheels) ?Gait Pattern/deviations: Step-to pattern, Trunk flexed, Narrow base of support, Decreased stride length, Decreased stance time - left ?Gait velocity: decr ?  ?  ?General Gait Details: slow antalgic gait, initially mod A to progress LLE forward and manual facilitation on L quad to encourage knee extension in stance phase. Cues for increased step length on the R to encourage greater weight shift to the left. pt progressed to min A for stability by end of gait training. ? ? ?Stairs ?  ?  ?  ?  ?  ? ? ?Wheelchair Mobility ?  ? ?Modified Rankin (Stroke Patients Only) ?  ? ? ?  ?Balance Overall balance assessment: Needs assistance ?Sitting-balance support: Feet supported ?Sitting balance-Leahy Scale: Fair ?Sitting balance - Comments: UE support ?  ?Standing balance support: Bilateral upper extremity supported ?Standing balance-Leahy Scale: Poor ?Standing balance comment: heavy reliance on RW ?  ?  ?  ?  ?  ?  ?  ?  ?  ?  ?  ?  ? ?  ?Cognition Arousal/Alertness: Awake/alert ?Behavior During Therapy: [facility] for tasks assessed/performed ?Overall Cognitive Status: Impaired/Different  from baseline ?Area of Impairment: Following commands, Problem solving, Awareness, Memory ?  ?  ?  ?  ?  ?  ?  ?  ?  ?  ?Memory: Decreased short-term memory ?Following Commands: Follows one step commands with  increased time ?  ?Awareness: Emergent ?Problem Solving: Slow processing, Requires verbal cues, Requires tactile cues ?General Comments: Mild cognitive impairment with memory loss at baseline. Currently requiring increased cues for recall of precautions and education. Needing increased time and verbal/tactile cues for sequencing of mobility. Decreased awareness. Oriented and motivated. ?  ?  ? ?  ?Exercises Total Joint Exercises ?Heel Slides: AAROM, Left, 5 reps, Seated (gait belt for leg lifter) ?Hip ABduction/ADduction: AAROM, Left, 5 reps, Seated (gait belt for leg lifter) ?Long Arc Quad: AROM, 10 reps, Seated, Left ?Knee Flexion: AROM, 10 reps, Standing, Left ?Marching in Standing: AROM, Both, 5 reps, Standing (unable to fully life LLE) ? ?  ?General Comments General comments (skin integrity, edema, etc.): son present and requrestion info about d/c location. Agreed with current plan for SNF placement. ?  ?  ? ?Pertinent Vitals/Pain Pain Assessment ?Pain Assessment: Faces ?Faces Pain Scale: Hurts a little bit ?Pain Location: With LLE movement ?Pain Descriptors / Indicators: Grimacing, Guarding ?Pain Intervention(s): Monitored during session, Limited activity within patient's tolerance  ? ? ?Home Living   ?  ?  ?  ?  ?  ?  ?  ?  ?  ?   ?  ?Prior Function    ?  ?  ?   ? ?PT Goals (current goals can now be found in the care plan section) Acute Rehab PT Goals ?Patient Stated Goal: to return to independent ?PT Goal Formulation: With patient/family ?Time For Goal Achievement: 04/11/22 ?Potential to Achieve Goals: Good ?Progress towards PT goals: Progressing toward goals ? ?  ?Frequency ? ? ? Min 3X/week ? ? ? ?  ?PT Plan Current plan remains appropriate  ? ? ?Co-evaluation   ?  ?  ?  ?  ? ?  ?AM-PAC PT "6 Clicks" Mobility   ?Outcome Measure ? Help needed turning from your back to your side while in a flat bed without using bedrails?: A Lot ?Help needed moving from lying on your back to sitting on the side of a flat bed  without using bedrails?: A Lot ?Help needed moving to and from a bed to a chair (including a wheelchair)?: A Lot ?Help needed standing up from a chair using your arms (e.g., wheelchair or bedside chair)?: A Little ?Help needed to walk in hospital room?: A Lot ?Help needed climbing 3-5 steps with a railing? : Total ?6 Click Score: 12 ? ?  ?End of Session Equipment Utilized During Treatment: Gait belt ?Activity Tolerance: Patient tolerated treatment well ?Patient left: in chair;with call bell/phone within reach;with family/visitor present ?Nurse Communication: Mobility status ?PT Visit Diagnosis: Other abnormalities of gait and mobility (R26.89);History of falling (Z91.81);Muscle weakness (generalized) (M62.81);Pain ?Pain - Right/Left: Left ?Pain - part of body: Hip ?  ? ? ?Time: 6283-6629 ?PT Time Calculation (min) (ACUTE ONLY): 40 min ? ?Charges:  $Gait Training: 38-52 mins          ?          ?Benjiman Core, PTA ?Acute Rehab ? ?Allena Katz ?03/30/2022, 11:47 AM ? ?

## 2022-03-30 NOTE — Plan of Care (Signed)
?  Problem: Clinical Measurements: ?Goal: Ability to maintain clinical measurements within normal limits will improve ?Outcome: Progressing ?Goal: Will remain free from infection ?Outcome: Progressing ?Goal: Diagnostic test results will improve ?Outcome: Progressing ?Goal: Respiratory complications will improve ?Outcome: Progressing ?Goal: Cardiovascular complication will be avoided ?Outcome: Progressing ?  ?Problem: Elimination: ?Goal: Will not experience complications related to bowel motility ?Outcome: Progressing ?Goal: Will not experience complications related to urinary retention ?Outcome: Progressing ?  ?Problem: Pain Managment: ?Goal: General experience of comfort will improve ?Outcome: Progressing ?  ?Problem: Safety: ?Goal: Ability to remain free from injury will improve ?Outcome: Progressing ?  ?Problem: Skin Integrity: ?Goal: Risk for impaired skin integrity will decrease ?Outcome: Progressing ?  ?Problem: Pain Management: ?Goal: Pain level will decrease ?Outcome: Progressing ?  ?

## 2022-03-30 NOTE — Progress Notes (Signed)
? ? ? ?  Subjective: ? ?Working with TOC to find a rehab facility. Sleeping comfortably.  ? ?Objective:  ? ?VITALS:   ?Vitals:  ? 03/28/22 1934 03/29/22 0546 03/29/22 0811 03/29/22 2149  ?BP: (!) 96/48 (!) 111/57 128/70 (!) 106/58  ?Pulse: 74 82 88 91  ?Resp: '17 17  14  '$ ?Temp: 98.3 ?F (36.8 ?C) 98.6 ?F (37 ?C) 98.3 ?F (36.8 ?C) 99.1 ?F (37.3 ?C)  ?TempSrc:    Oral  ?SpO2: 99% 100% 99% 96%  ?Weight:      ?Height:      ? ? ?Sensation intact distally ?Intact pulses distally ?Dorsiflexion/Plantar flexion intact ?Incision: dressing C/D/I ?No cellulitis present ?Compartment soft ?  ? ?Lab Results  ?Component Value Date  ? WBC 7.9 03/28/2022  ? HGB 11.2 (L) 03/29/2022  ? HCT 32.4 (L) 03/29/2022  ? MCV 102.2 (H) 03/28/2022  ? PLT 142 (L) 03/28/2022  ? ?BMET ?   ?Component Value Date/Time  ? NA 132 (L) 03/28/2022 0140  ? K 4.2 03/28/2022 0140  ? CL 103 03/28/2022 0140  ? CO2 24 03/28/2022 0140  ? GLUCOSE 139 (H) 03/28/2022 0140  ? BUN 9 03/28/2022 0140  ? CREATININE 0.84 03/28/2022 0140  ? CALCIUM 8.2 (L) 03/28/2022 0140  ? GFRNONAA >60 03/28/2022 0140  ? ? ? ? ?Xray: Postop x-rays show left total hip arthroplasty in good position without adverse features.  Leg lengths appear restored. ? ?Assessment/Plan: ?3 Days Post-Op  ? ?Principal Problem: ?  Hip fracture ([place]) ?Active Problems: ?  Mild cognitive impairment with memory loss ?  Depression with anxiety ?  Spinal stenosis at L4-L5 level ? ?L THA for femoral neck fracture on 4/5 ? ?Post op recs: ?WB: WBAT LLE, posterior hip precautions x6 weeks ?Abx: ancef x23 hours post op ?Imaging: PACU pelvis Xray ?Dressing: Aquacell, keep intact until follow up ?DVT prophylaxis: Aspirin 81BID starting POD1 ?Follow up: 2 weeks after surgery for a wound check with Dr. Zachery Dakins at [facility].  ?Address: [address] [place], [place], [place] [address]  ?Office Phone: [phone] ? ? ? ?Maylene Roes [place] ?03/30/2022, 3:37 AM ? ? ? ? ?Contact information:   ? ?After hours and  holidays please check [email or web address] for group call information for [facility] ? ?  ?

## 2022-03-30 NOTE — TOC Progression Note (Signed)
Transition of Care (TOC) - Progression Note  ? ? ?Patient Details  ?Name: Mackenzie Green ?MRN: [id number] ?Date of Birth: 08-08-1946 ? ?Transition of Care (TOC) CM/SW Contact  ?[place], LCSWA ?Phone Number: ?03/30/2022, 12:37 PM ? ?Clinical Narrative:    ? ?CSW spoke with [name] at request of NT, he inquired about potential beds, CSW advised that at this point we are still waiting on pt's PASSR number to come back and unfortunately even if a bed was available, we would not be able to move forward without that number. Pt's son still interested in [facility], [facility] and [facility]. CSW advised next week another CSW would follow up.  ? ?Expected Discharge Plan: [place] ?Barriers to Discharge: Continued Medical Work up, Other (must enter comment) (pt asking for possible CIR admit, prefers to SNF) ? ?Expected Discharge Plan and Services ?Expected Discharge Plan: [place] ?In-house Referral: Clinical Social Work ?  ?Post Acute Care Choice: IP Rehab ?Living arrangements for the past 2 months: [place] ?                ?  ?  ?  ?  ?  ?  ?  ?  ?  ?  ? ? ?Social Determinants of Health (SDOH) Interventions ?  ? ?Readmission Risk Interventions ?   ? View : No data to display.  ?  ?  ?  ? ? ?

## 2022-03-31 ENCOUNTER — Encounter (HOSPITAL_COMMUNITY): Payer: Self-pay | Admitting: Orthopedic Surgery

## 2022-03-31 MED ORDER — ACETAMINOPHEN 500 MG PO TABS
1000.0000 mg | ORAL_TABLET | Freq: Four times a day (QID) | ORAL | Status: DC | PRN
  Administered 2022-03-31 – 2022-04-01 (×3): 1000 mg via ORAL
  Filled 2022-03-31 (×3): qty 2

## 2022-03-31 NOTE — Progress Notes (Signed)
?PROGRESS NOTE ? ?Mackenzie Green  RJJ:[id number] DOB: Jul 15, 1946 DOA: 03/26/2022 ?PCP: Aretta Nip, MD  ? ?Brief Narrative: ?Patient is a 76 year old female with no significant medical problems except for iron deficiency anemia, mild cognitive impairment who presented to the emergency department with complaint of left hip pain after she fell on her left hip while she was hiking.  X-ray of the left hip showed displaced left femoral neck fracture.  Orthopedics consulted,underwent total left  hip arthroplasty on 4/5.PT/OT evaluation done, recommended skilled nursing facility on discharge.  Medically stable for discharge whenever possible. ? ?Assessment & Plan: ? ?Principal Problem: ?  Hip fracture ([place]) ?Active Problems: ?  Mild cognitive impairment with memory loss ?  Depression with anxiety ?  Spinal stenosis at L4-L5 level ? ? ?Left hip fracture: [place] on the left side while hiking.  No history of head injury. X-ray of the left hip showed displaced left femoral neck fracture.  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  Continue supportive care, pain management.  DVT prophylaxis with aspirin BID. ?Patient is able to ambulate normally on her usual days, lives alone. ?Continue bowel regimen ?PT/OT recommended skilled nursing facility on discharge.  TOC consulted ? ?Back pain: CT lumbar spine showed broad-based disc protrusion asymmetric left at L4-5 contributing to moderate spinal and bilateral lateral recess stenosis. continue supportive care, pain management.  Follow-up with neurosurgery as an outpatient.  Back pain has significantly improved. ? ?History of iron deficiency: Takes iron supplementation at home. ? ? ?Nutrition Problem: Increased nutrient needs ?Etiology: hip fracture ?  ? ?DVT prophylaxis:SCDs Start: 03/26/22 1627 ? ? ?  Code Status: Full Code ? ?Family Communication: Discussed with son at bedside on 4/7 ?Patient status: Inpatient ? ?Patient is from : Home ? ?Anticipated discharge  to:  skilled nursing facility ? ?Estimated DC date: Whenever bed is available ? ?Consultants: Orthopedics ? ?Procedures: Plan for ORIF ? ?Antimicrobials:  ?Anti-infectives (From admission, onward)  ? ? Start     Dose/Rate Route Frequency Ordered Stop  ? 03/28/22 0745  ceFAZolin (ANCEF) IVPB 2g/100 mL premix       ? 2 g ?200 mL/hr over 30 Minutes Intravenous Every 8 hours 03/28/22 0654 03/28/22 1542  ? 03/27/22 1430  ceFAZolin (ANCEF) IVPB 2g/100 mL premix       ? 2 g ?200 mL/hr over 30 Minutes Intravenous On call to O.R. 03/27/22 0036 03/27/22 1720  ? 03/27/22 0600  ceFAZolin (ANCEF) IVPB 2g/100 mL premix  Status:  Discontinued       ? 2 g ?200 mL/hr over 30 Minutes Intravenous On call to O.R. 03/27/22 0030 03/27/22 0036  ? ?  ? ? ?Subjective: ?Patient seen and examined at the bedside this morning.  Hemodynamically stable.  Denies any new complaints today.  Eating her breakfast ? ?Objective: ?Vitals:  ? 03/30/22 1440 03/30/22 1950 03/31/22 0431 03/31/22 0837  ?BP: 121/63 (!) 102/53 111/61 127/65  ?Pulse: 86 99 84 75  ?Resp: '16 20 14 20  '$ ?Temp: 98.5 ?F (36.9 ?C) 98.1 ?F (36.7 ?C) 98.6 ?F (37 ?C) 98.7 ?F (37.1 ?C)  ?TempSrc: Oral Oral Oral Oral  ?SpO2: 100% 100% 98% 100%  ?Weight:      ?Height:      ? ? ?Intake/Output Summary (Last 24 hours) at 03/31/2022 1050 ?Last data filed at 03/31/2022 0900 ?Gross per 24 hour  ?Intake 240 ml  ?Output --  ?Net 240 ml  ? ?Filed Weights  ? 03/26/22 1217 03/27/22 1618  ?  Weight: 45.8 kg 45.8 kg  ? ? ?Examination: ? ?General exam: Overall comfortable, not in distress ?HEENT: PERRL ?Respiratory system:  no wheezes or crackles  ?Cardiovascular system: S1 & S2 heard, RRR.  ?Gastrointestinal system: Abdomen is nondistended, soft and nontender. ?Central nervous system: Alert and oriented ?Extremities: No edema, no clubbing ,no cyanosis, clean surgical wound on the left hip ?Skin: No rashes, no ulcers,no icterus       ? ? ?Data Reviewed: I have personally reviewed following labs and imaging  studies ? ?CBC: ?Recent Labs  ?Lab 03/26/22 ?1424 03/27/22 ?4782 03/28/22 ?0140 03/29/22 ?9562  ?WBC 10.2 5.2 7.9  --   ?NEUTROABS 9.3*  --  7.0  --   ?HGB 13.0 12.6 12.6 11.2*  ?HCT 39.0 37.2 37.0 32.4*  ?MCV 105.4* 103.6* 102.2*  --   ?PLT 172 156 142*  --   ? ?Basic Metabolic Panel: ?Recent Labs  ?Lab 03/26/22 ?1424 03/27/22 ?1308 03/28/22 ?0140  ?NA 137 134* 132*  ?K 3.8 4.0 4.2  ?CL 103 102 103  ?CO2 '25 26 24  '$ ?GLUCOSE 116* 100* 139*  ?BUN '10 10 9  '$ ?CREATININE 0.88 0.77 0.84  ?CALCIUM 8.7* 8.3* 8.2*  ? ? ? ?No results found for this or any previous visit (from the past 240 hour(s)).  ? ?Radiology Studies: ?No results found. ? ?Scheduled Meds: ? (feeding supplement) PROSource Plus  30 mL Oral BID BM  ? vitamin C  500 mg Oral Daily  ? aspirin EC  81 mg Oral BID  ? cholecalciferol  1,000 Units Oral Daily  ? docusate sodium  100 mg Oral BID  ? feeding supplement  1 Container Oral TID BM  ? ferrous sulfate  325 mg Oral Daily  ? multivitamin with minerals  1 tablet Oral Daily  ? pantoprazole  40 mg Oral Daily  ? polyethylene glycol  17 g Oral Daily  ? ?Continuous Infusions: ? methocarbamol (ROBAXIN) IV    ? ? ? LOS: 5 days  ? ?Shelly Coss, MD ?[facility] ?P4/08/2022, 10:50 AM   ?

## 2022-03-31 NOTE — Plan of Care (Signed)
?  Problem: Education: ?Goal: Knowledge of General Education information will improve ?Description: Including pain rating scale, medication(s)/side effects and non-pharmacologic comfort measures ?Outcome: Progressing ?  ?Problem: Safety: ?Goal: Ability to remain free from injury will improve ?Outcome: Progressing ?  ?Problem: Skin Integrity: ?Goal: Risk for impaired skin integrity will decrease ?Outcome: Progressing ?  ?Problem: Activity: ?Goal: Ability to ambulate and perform ADLs will improve ?Outcome: Progressing ?  ?Problem: Self-Concept: ?Goal: Ability to maintain and perform role responsibilities to the fullest extent possible will improve ?Outcome: Progressing ?  ?Problem: Pain Management: ?Goal: Pain level will decrease ?Outcome: Progressing ?  ?

## 2022-03-31 NOTE — Progress Notes (Signed)
? ? ? ?  Subjective: ? ?Working with TOC to find a rehab facility. Motivated to get stronger and work with therapy. She is going to [place] in June and wants to be ready for this trip.  ? ?Objective:  ? ?VITALS:   ?Vitals:  ? 03/30/22 1440 03/30/22 1950 03/31/22 0431 03/31/22 0837  ?BP: 121/63 (!) 102/53 111/61 127/65  ?Pulse: 86 99 84 75  ?Resp: '16 20 14 20  '$ ?Temp: 98.5 ?F (36.9 ?C) 98.1 ?F (36.7 ?C) 98.6 ?F (37 ?C) 98.7 ?F (37.1 ?C)  ?TempSrc: Oral Oral Oral Oral  ?SpO2: 100% 100% 98% 100%  ?Weight:      ?Height:      ? ? ?Sensation intact distally ?Intact pulses distally ?Dorsiflexion/Plantar flexion intact ?Incision: dressing C/D/I ?No cellulitis present ?Compartment soft ?  ? ?Lab Results  ?Component Value Date  ? WBC 7.9 03/28/2022  ? HGB 11.2 (L) 03/29/2022  ? HCT 32.4 (L) 03/29/2022  ? MCV 102.2 (H) 03/28/2022  ? PLT 142 (L) 03/28/2022  ? ?BMET ?   ?Component Value Date/Time  ? NA 132 (L) 03/28/2022 0140  ? K 4.2 03/28/2022 0140  ? CL 103 03/28/2022 0140  ? CO2 24 03/28/2022 0140  ? GLUCOSE 139 (H) 03/28/2022 0140  ? BUN 9 03/28/2022 0140  ? CREATININE 0.84 03/28/2022 0140  ? CALCIUM 8.2 (L) 03/28/2022 0140  ? GFRNONAA >60 03/28/2022 0140  ? ? ? ? ?Xray: Postop x-rays show left total hip arthroplasty in good position without adverse features.  Leg lengths appear restored. ? ?Assessment/Plan: ?4 Days Post-Op  ? ?Principal Problem: ?  Hip fracture ([place]) ?Active Problems: ?  Mild cognitive impairment with memory loss ?  Depression with anxiety ?  Spinal stenosis at L4-L5 level ? ?L THA for femoral neck fracture on 4/5 ? ?Post op recs: ?WB: WBAT LLE, posterior hip precautions x6 weeks ?Abx: ancef x23 hours post op ?Imaging: PACU pelvis Xray ?Dressing: Aquacell, keep intact until follow up ?DVT prophylaxis: Aspirin 81BID starting POD1 ?Follow up: 2 weeks after surgery for a wound check with Dr. Zachery Dakins at [facility].  ?Address: [address] [place], [place], [place] [address]  ?Office Phone:  [phone] ? ?Discharge medications printed and placed in patient's chart. Okay to discharge to SNF from orthopedic standpoint once cleared by medicine team and therapy.  ? ?Mackenzie Green ?03/31/2022, 9:10 AM ? ? ? ? ?Contact information:   ? ?After hours and holidays please check [email or web address] for group call information for [facility] ? ?  ?

## 2022-04-01 DIAGNOSIS — S70312A Abrasion, left thigh, initial encounter: Secondary | ICD-10-CM | POA: Diagnosis not present

## 2022-04-01 DIAGNOSIS — M62838 Other muscle spasm: Secondary | ICD-10-CM | POA: Diagnosis not present

## 2022-04-01 DIAGNOSIS — Z743 Need for continuous supervision: Secondary | ICD-10-CM | POA: Diagnosis not present

## 2022-04-01 DIAGNOSIS — Z8719 Personal history of other diseases of the digestive system: Secondary | ICD-10-CM | POA: Diagnosis not present

## 2022-04-01 DIAGNOSIS — L853 Xerosis cutis: Secondary | ICD-10-CM | POA: Diagnosis not present

## 2022-04-01 DIAGNOSIS — S8780XA Crushing injury of unspecified lower leg, initial encounter: Secondary | ICD-10-CM | POA: Diagnosis not present

## 2022-04-01 DIAGNOSIS — S72002D Fracture of unspecified part of neck of left femur, subsequent encounter for closed fracture with routine healing: Secondary | ICD-10-CM | POA: Diagnosis not present

## 2022-04-01 DIAGNOSIS — S72002A Fracture of unspecified part of neck of left femur, initial encounter for closed fracture: Secondary | ICD-10-CM | POA: Diagnosis not present

## 2022-04-01 DIAGNOSIS — M25552 Pain in left hip: Secondary | ICD-10-CM | POA: Diagnosis not present

## 2022-04-01 DIAGNOSIS — R600 Localized edema: Secondary | ICD-10-CM | POA: Diagnosis not present

## 2022-04-01 DIAGNOSIS — M6281 Muscle weakness (generalized): Secondary | ICD-10-CM | POA: Diagnosis not present

## 2022-04-01 DIAGNOSIS — E162 Hypoglycemia, unspecified: Secondary | ICD-10-CM | POA: Diagnosis not present

## 2022-04-01 DIAGNOSIS — S70312D Abrasion, left thigh, subsequent encounter: Secondary | ICD-10-CM | POA: Diagnosis not present

## 2022-04-01 DIAGNOSIS — G3184 Mild cognitive impairment, so stated: Secondary | ICD-10-CM | POA: Diagnosis not present

## 2022-04-01 DIAGNOSIS — Z9181 History of falling: Secondary | ICD-10-CM | POA: Diagnosis not present

## 2022-04-01 DIAGNOSIS — Z7689 Persons encountering health services in other specified circumstances: Secondary | ICD-10-CM | POA: Diagnosis not present

## 2022-04-01 DIAGNOSIS — M48061 Spinal stenosis, lumbar region without neurogenic claudication: Secondary | ICD-10-CM | POA: Diagnosis not present

## 2022-04-01 DIAGNOSIS — M545 Low back pain, unspecified: Secondary | ICD-10-CM | POA: Diagnosis not present

## 2022-04-01 DIAGNOSIS — W19XXXA Unspecified fall, initial encounter: Secondary | ICD-10-CM | POA: Diagnosis not present

## 2022-04-01 DIAGNOSIS — S72009A Fracture of unspecified part of neck of unspecified femur, initial encounter for closed fracture: Secondary | ICD-10-CM | POA: Diagnosis not present

## 2022-04-01 DIAGNOSIS — R2689 Other abnormalities of gait and mobility: Secondary | ICD-10-CM | POA: Diagnosis not present

## 2022-04-01 DIAGNOSIS — E871 Hypo-osmolality and hyponatremia: Secondary | ICD-10-CM | POA: Diagnosis not present

## 2022-04-01 DIAGNOSIS — K5900 Constipation, unspecified: Secondary | ICD-10-CM | POA: Diagnosis not present

## 2022-04-01 DIAGNOSIS — N390 Urinary tract infection, site not specified: Secondary | ICD-10-CM | POA: Diagnosis not present

## 2022-04-01 MED ORDER — ACETAMINOPHEN 500 MG PO TABS
1000.0000 mg | ORAL_TABLET | Freq: Four times a day (QID) | ORAL | 0 refills | Status: DC | PRN
Start: 2022-04-01 — End: 2023-01-24

## 2022-04-01 MED ORDER — CLONAZEPAM 0.5 MG PO TABS: 0.2500 mg | ORAL_TABLET | Freq: Every evening | ORAL | 0 refills | Status: DC | PRN

## 2022-04-01 MED ORDER — DOCUSATE SODIUM 100 MG PO CAPS: 100.0000 mg | ORAL_CAPSULE | Freq: Two times a day (BID) | ORAL | 0 refills | Status: DC

## 2022-04-01 MED ORDER — POLYETHYLENE GLYCOL 3350 17 G PO PACK: 17.0000 g | PACK | Freq: Every day | ORAL | 0 refills | Status: DC

## 2022-04-01 NOTE — Discharge Summary (Signed)
Physician Discharge Summary  ?Mackenzie Green JEH:[id number] DOB: [birth year] DOA: 03/26/2022 ? ?PCP: Aretta Nip, MD ? ?Admit date: 03/26/2022 ?Discharge date: 04/01/2022 ? ?Admitted From: Home ?Disposition:  Home ? ?Discharge Condition:Stable ?CODE STATUS:FULL ?Diet recommendation: Regular ? ?Brief/Interim Summary: ? ?Patient is a 76 year old female with no significant medical problems except for iron deficiency anemia, mild cognitive impairment who presented to the emergency department with complaint of left hip pain after she fell on her left hip while she was hiking.  X-ray of the left hip showed displaced left femoral neck fracture.  Orthopedics consulted,underwent total left  hip arthroplasty on 4/5.PT/OT evaluation done, recommended skilled nursing facility on discharge.  Medically stable for discharge . ? ?Following problems were addressed during her hospitalization: ? ?Left hip fracture: [place] on the left side while hiking.  No history of head injury. X-ray of the left hip showed displaced left femoral neck fracture.  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  Continue supportive care, pain management.  DVT prophylaxis with aspirin BID. ?Patient is able to ambulate normally on her usual days, lives alone. ?Continue bowel regimen ?PT/OT recommended skilled nursing facility on discharge.  TOC consulted ?  ?Back pain: CT lumbar spine showed broad-based disc protrusion asymmetric left at L4-5 contributing to moderate spinal and bilateral lateral recess stenosis. continue supportive care, pain management.  Follow-up with neurosurgery as an outpatient.  Back pain has significantly improved. ?  ?History of iron deficiency: Takes iron supplementation at home. ?  ? ?Discharge Diagnoses:  ?Principal Problem: ?  Hip fracture ([place]) ?Active Problems: ?  Mild cognitive impairment with memory loss ?  Depression with anxiety ?  Spinal stenosis at L4-L5 level ? ? ? ?Discharge Instructions ? ?Discharge  Instructions   ? ? Diet - low sodium heart healthy   Complete by: As directed ?  ? Discharge instructions   Complete by: As directed ?  ? 1)Please take prescribed medications as instructed ?2)Follow up with orthopedics in 2 weeks.  Name and number the provider has been attached  ? Increase activity slowly   Complete by: As directed ?  ? No wound care   Complete by: As directed ?  ? ?  ? ?Allergies as of 04/01/2022   ? ?   Reactions  ? Dairy Aid [tilactase]   ? ?  ? ?  ?Medication List  ?  ? ?TAKE these medications   ? ?acetaminophen 500 MG tablet ?Commonly known as: TYLENOL ?Take 2 tablets (1,000 mg total) by mouth every 6 (six) hours as needed for moderate pain. ?  ?aspirin EC 81 MG tablet ?Take 1 tablet (81 mg total) by mouth 2 (two) times daily. For DVT prophylaxis after surgery ?  ?CALCIUM PO ?Take 1 tablet by mouth daily. ?  ?cholecalciferol 25 MCG (1000 UNIT) tablet ?Commonly known as: VITAMIN D3 ?Take 1,000 Units by mouth daily. ?  ?cholestyramine 4 g packet ?Commonly known as: QUESTRAN ?Take 4 g by mouth 2 (two) times daily as needed (IBS). ?  ?clonazePAM 0.5 MG tablet ?Commonly known as: KLONOPIN ?Take 0.5 tablets (0.25 mg total) by mouth at bedtime as needed for anxiety. ?  ?docusate sodium 100 MG capsule ?Commonly known as: COLACE ?Take 1 capsule (100 mg total) by mouth 2 (two) times daily. ?  ?IRON PO ?Take 1 tablet by mouth daily. ?  ?Multivitamin Adult Tabs ?Take 1 tablet by mouth daily. ?  ?oxyCODONE 5 MG immediate release tablet ?Commonly known as: Oxy IR/ROXICODONE ?Take 1  pill every 6 hours as needed for severe pain ?  ?polyethylene glycol 17 g packet ?Commonly known as: MIRALAX / GLYCOLAX ?Take 17 g by mouth daily. ?Start taking on: April 02, 2022 ?  ?vitamin C 500 MG tablet ?Commonly known as: ASCORBIC ACID ?Take 500 mg by mouth daily. ?  ? ?  ? ? Follow-up Information   ? ? Willaim Sheng, MD Follow up in 2 week(s).   ?Specialty: Orthopedic Surgery ?Contact information: ?[address] ?[place] [id number] ?[phone] ? ? ?  ?  ? ?  ?  ? ?  ? ?Allergies  ?Allergen Reactions  ? Dairy Aid [Tilactase]   ? ? ?Consultations: ?Orthopedics ? ? ?Procedures/Studies: ?DG Chest 1 View ? ?Result Date: 03/26/2022 ?CLINICAL DATA:  Left hip fracture.  Pre-op clearance exam EXAM: CHEST  1 VIEW COMPARISON:  07/13/2018 FINDINGS: The heart size and mediastinal contours are within normal limits. Pulmonary hyperinflation again seen, consistent with COPD. Small calcified granuloma again noted in the left lower lung. Both lungs are otherwise clear. The visualized skeletal structures are unremarkable. IMPRESSION: COPD. No active disease. Electronically Signed   By: Marlaine Hind M.D.   On: 03/26/2022 13:00  ? ?CT LUMBAR SPINE WO CONTRAST ? ?Result Date: 03/26/2022 ?CLINICAL DATA:  Low back pain. EXAM: CT LUMBAR SPINE WITHOUT CONTRAST TECHNIQUE: Multidetector CT imaging of the lumbar spine was performed without intravenous contrast administration. Multiplanar CT image reconstructions were also generated. RADIATION DOSE REDUCTION: This exam was performed according to the departmental dose-optimization program which includes automated exposure control, adjustment of the mA and/or kV according to patient size and/or use of iterative reconstruction technique. COMPARISON:  MRI 03/13/2005 FINDINGS: Segmentation: There are five lumbar type vertebral bodies. The last full intervertebral disc space is labeled L5-S1. Alignment: Normal Vertebrae: Moderate osteoporosis but no bone lesion or fracture. Facets are normally aligned. No pars defects. Paraspinal and other soft tissues: No significant paraspinal or retroperitoneal findings. Scattered aortic calcifications but no aneurysm. Disc levels: Mild bulging discs but no focal disc protrusions, significant spinal at T12-L1,L1-2, L2-3 or L3-4. L4-5: Broad-based disc protrusion asymmetric left. This in combination with facet disease and ligamentum flavum thickening creates  moderate spinal and bilateral lateral recess stenosis. There is also a left foraminal component which appears to contact and slightly displace the left L4 nerve root. Recommend correlation with left L4 radiculopathy. L5-S1: Mild annular bulge with mild impression on the ventral thecal sac but no significant spinal foraminal stenosis. 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 There is also a left foraminal component which appears to contact and slightly displace the left L4 nerve root. Recommend correlation with left L4 radiculopathy. No acute bony findings or bone lesions. Aortic Atherosclerosis (ICD10-I70.0). Electronically Signed   By: Marijo Sanes M.D.   On: 03/26/2022 15:10  ? ?DG Knee Complete 4 Views Left ? ?Result Date: 03/26/2022 ?CLINICAL DATA:  Fall wall hiking today.  Left knee pain. EXAM: LEFT KNEE - COMPLETE 4+ VIEW COMPARISON:  None. FINDINGS: No evidence of fracture, dislocation, or joint effusion. No evidence of arthropathy or other focal bone abnormality. Peripheral vascular calcification noted in the upper left leg. IMPRESSION: No acute findings. Electronically Signed   By: Marlaine Hind M.D.   On: 03/26/2022 15:29  ? ?DG HIP PORT UNILAT WITH PELVIS 1V LEFT ? ?Result Date: 03/27/2022 ?CLINICAL DATA:  Intraoperative left hip for left total hip arthroplasty. EXAM: DG  HIP (WITH OR WITHOUT PELVIS) 1V PORT LEFT COMPARISON:  None. FINDINGS: A single portable cross-table lateral view of the left hip is obtained demonstrating interval placement of a left hip arthroplasty using non cemented components. Screw fixation of the acetabular cup. Components appear well seated. Soft tissue gas is consistent with recent surgery. IMPRESSION: Left total hip arthroplasty.  No acute complication is indicated. Electronically Signed   By: Lucienne Capers M.D.   On: 03/27/2022 19:28  ? ?DG HIP UNILAT W OR W/O PELVIS 2-3 VIEWS  LEFT ? ?Result Date: 03/27/2022 ?CLINICAL DATA:  Postoperative. EXAM: DG HIP (WITH OR WITHOUT PELVIS) 2-3V LEFT COMPARISON:  03/27/2022. FINDINGS: There is a left hip arthroplasty in anatomic alignment. No evidence for fractu

## 2022-04-01 NOTE — TOC Transition Note (Signed)
Transition of Care (TOC) - CM/SW Discharge Note ? ? ?Patient Details  ?Name: JANNELLE NOTARO ?MRN: [id number] ?Date of Birth: 1946/03/13 ? ?Transition of [facility]) CM/SW Contact:  ?Joanne Chars, LCSW ?Phone Number: ?04/01/2022, 10:24 AM ? ? ?Clinical Narrative:   Pt discharging to [facility].  RN call [phone] for report.  ? ? ? ?Final next level of care: Ward ?Barriers to Discharge: Barriers Resolved ? ? ?Patient Goals and CMS Choice ?Patient states their goals for this hospitalization and ongoing recovery are:: "get back on the trail" ?  ?  ? ?Discharge Placement ?  ?           ?Patient chooses bed at:  [facility]) ?Patient to be transferred to facility by: [facility] ?Name of family member notified: son Thurmond Butts ?Patient and family notified of of transfer: 04/01/22 ? ?Discharge Plan and Services ?In-house Referral: Clinical Social Work ?  ?Post Acute Care Choice: IP Rehab          ?  ?  ?  ?  ?  ?  ?  ?  ?  ?  ? ?Social Determinants of Health (SDOH) Interventions ?  ? ? ?Readmission Risk Interventions ?   ? View : No data to display.  ?  ?  ?  ? ? ? ? ? ?

## 2022-04-01 NOTE — Progress Notes (Signed)
Occupational Therapy Treatment ?Patient Details ?Name: Mackenzie Green ?MRN: [id number] ?DOB: October 11, 1946 ?Today's Date: 04/01/2022 ? ? ?History of present illness 76 yo female presenting to ED with fall sustained while hiking resulting in L hip injury. X-ray on 4/4 obtained in triage shows a left femoral neck fracture. S/p L THA on 4/5; posterior hip precautions. PMH including depression with anxiety, Mild cognitive impairment with memory loss, and spinal stenosis at L4-5 level. ?  ?OT comments ? Pt making good progress towards goals this session, completes transfers with min guard/minA, benefits from elevated surface. Pt eager for ambulation, able to walk hallway distance with min A and RW, needing increased cues for step sequencing and when making turns. Pt mod A for bed mobility, requiring increased assistance to bring BLE's into bed. Able to recall 3/3 hip precautions, reviewed with pt during session. Pt presenting with impairments listed below, will follow acutely. Continue to recommend SNF at d/c.  ? ?Recommendations for follow up therapy are one component of a multi-disciplinary discharge planning process, led by the attending physician.  Recommendations may be updated based on patient status, additional functional criteria and insurance authorization. ?   ?Follow Up Recommendations ? Skilled nursing-short term rehab (<3 hours/day)  ?  ?Assistance Recommended at Discharge Frequent or constant Supervision/Assistance  ?Patient can return home with the following ? A little help with walking and/or transfers;A little help with bathing/dressing/bathroom ?  ?Equipment Recommendations ? Other (comment) (defer to next venue of care)  ?  ?Recommendations for Other Services PT consult ? ?  ?Precautions / Restrictions Precautions ?Precautions: Fall;Posterior Hip ?Precaution Booklet Issued: Yes (comment) ?Precaution Comments: Reviewed posterior hip precautions; pt able to recall 2/3, with prompting able to recall  3rd ?Restrictions ?Weight Bearing Restrictions: Yes ?LLE Weight Bearing: Weight bearing as tolerated  ? ? ?  ? ?Mobility Bed Mobility ?Overal bed mobility: Needs Assistance ?Bed Mobility: Sit to Supine ?  ?  ?Supine to sit: Mod assist, HOB elevated ?  ?  ?General bed mobility comments: to pivot hips into long sitting in bed ?  ? ?Transfers ?Overall transfer level: Needs assistance ?Equipment used: Rolling walker (2 wheels) ?Transfers: Sit to/from Stand ?Sit to Stand: Min assist ?  ?  ?Step pivot transfers: Min assist ?  ?  ?General transfer comment: benefits from elevated surface and cues for sequencing ?  ?  ?Balance Overall balance assessment: Needs assistance ?Sitting-balance support: Feet supported ?Sitting balance-Leahy Scale: Fair ?  ?  ?Standing balance support: Bilateral upper extremity supported ?Standing balance-Leahy Scale: Poor ?  ?  ?  ?  ?  ?  ?  ?  ?  ?  ?  ?  ?   ? ?ADL either performed or assessed with clinical judgement  ? ?ADL Overall ADL's : Needs assistance/impaired ?  ?  ?  ?  ?  ?  ?  ?  ?  ?  ?  ?  ?Toilet Transfer: Min guard;Rolling walker (2 wheels);Ambulation;Comfort height toilet ?Toilet Transfer Details (indicate cue type and reason): BSC over toilet ?Toileting- Clothing Manipulation and Hygiene: Supervision/safety;Sitting/lateral lean ?Toileting - Clothing Manipulation Details (indicate cue type and reason): for pericare ?  ?  ?Functional mobility during ADLs: Min guard;Rolling walker (2 wheels);Cueing for safety;Cueing for sequencing ?  ?  ? ?Extremity/Trunk Assessment Upper Extremity Assessment ?Upper Extremity Assessment: Overall WFL for tasks assessed ?  ?Lower Extremity Assessment ?Lower Extremity Assessment: Defer to PT evaluation ?  ?  ?  ? ?Vision   ?Vision  Assessment?: No apparent visual deficits ?  ?Perception Perception ?Perception: Not tested ?  ?Praxis Praxis ?Praxis: Not tested ?  ? ?Cognition Arousal/Alertness: Awake/alert ?Behavior During Therapy: [facility] for tasks  assessed/performed ?  ?  ?  ?  ?  ?  ?  ?  ?  ?  ?  ?  ?  ?  ?  ?  ?  ?General Comments: motivated, needing increased cuing and reassurance for movement sequencing throughout session ?  ?  ?   ?Exercises   ? ?  ?Shoulder Instructions   ? ? ?  ?General Comments    ? ? ?Pertinent Vitals/ Pain       Pain Assessment ?Pain Assessment: Faces ?Pain Score: 2  ?Pain Location: L outer ankle and knee with walking ?Pain Descriptors / Indicators: Grimacing, Guarding ?Pain Intervention(s): Limited activity within patient's tolerance, Monitored during session, Repositioned, Patient requesting pain meds-RN notified ? ?Home Living   ?  ?  ?  ?  ?  ?  ?  ?  ?  ?  ?  ?  ?  ?  ?  ?  ?  ?  ? ?  ?Prior Functioning/Environment    ?  ?  ?  ?   ? ?Frequency ? Min 2X/week  ? ? ? ? ?  ?Progress Toward Goals ? ?OT Goals(current goals can now be found in the care plan section) ? Progress towards OT goals: Progressing toward goals ? ?Acute Rehab OT Goals ?Patient Stated Goal: to go home ?OT Goal Formulation: With patient ?Time For Goal Achievement: 04/11/22 ?Potential to Achieve Goals: Good ?ADL Goals ?Pt Will Perform Lower Body Dressing: with min guard assist;with adaptive equipment;sit to/from stand ?Pt Will Transfer to Toilet: with min guard assist;ambulating;bedside commode ?Pt Will Perform Toileting - Clothing Manipulation and hygiene: with min guard assist;sit to/from stand;sitting/lateral leans ?Additional ADL Goal #1: Pt will independently recall 3/3 posterior hip precautions.  ?Plan Discharge plan remains appropriate;Frequency remains appropriate   ? ?Co-evaluation ? ? ?   ?  ?  ?  ?  ? ?  ?AM-PAC OT "6 Clicks" Daily Activity     ?Outcome Measure ? ? Help from another person eating meals?: None ?Help from another person taking care of personal grooming?: None ?Help from another person toileting, which includes using toliet, bedpan, or urinal?: A Little ?Help from another person bathing (including washing, rinsing, drying)?: A Lot ?Help  from another person to put on and taking off regular upper body clothing?: A Little ?Help from another person to put on and taking off regular lower body clothing?: A Lot ?6 Click Score: 18 ? ?  ?End of Session Equipment Utilized During Treatment: Gait belt;Rolling walker (2 wheels) ? ?OT Visit Diagnosis: Unsteadiness on feet (R26.81);Other abnormalities of gait and mobility (R26.89);Muscle weakness (generalized) (M62.81) ?  ?Activity Tolerance Patient tolerated treatment well ?  ?Patient Left in bed;with call bell/phone within reach;with bed alarm set ?  ?Nurse Communication Mobility status;Patient requests pain meds ?  ? ?   ? ?Time: 0102-7253 ?OT Time Calculation (min): 41 min ? ?Charges: OT General Charges ?$OT Visit: 1 Visit ?OT Treatments ?$Self Care/Home Management : 38-52 mins ? ?Lynnda Child, OTD, OTR/L ?Acute Rehab ?[phone] ? ?Kaylyn Lim ?04/01/2022, 10:53 AM ?

## 2022-04-01 NOTE — Plan of Care (Signed)
?  Problem: Education: ?Goal: Knowledge of General Education information will improve ?Description: Including pain rating scale, medication(s)/side effects and non-pharmacologic comfort measures ?Outcome: Progressing ?  ?Problem: Health Behavior/Discharge Planning: ?Goal: Ability to manage health-related needs will improve ?Outcome: Adequate for Discharge ?  ?Problem: Clinical Measurements: ?Goal: Ability to maintain clinical measurements within normal limits will improve ?Outcome: Adequate for Discharge ?Goal: Will remain free from infection ?Outcome: Adequate for Discharge ?Goal: Diagnostic test results will improve ?Outcome: Adequate for Discharge ?Goal: Respiratory complications will improve ?Outcome: Adequate for Discharge ?Goal: Cardiovascular complication will be avoided ?Outcome: Adequate for Discharge ?  ?Problem: Activity: ?Goal: Risk for activity intolerance will decrease ?Outcome: Adequate for Discharge ?  ?Problem: Nutrition: ?Goal: Adequate nutrition will be maintained ?Outcome: Adequate for Discharge ?  ?Problem: Coping: ?Goal: Level of anxiety will decrease ?Outcome: Adequate for Discharge ?  ?Problem: Elimination: ?Goal: Will not experience complications related to bowel motility ?Outcome: Adequate for Discharge ?Goal: Will not experience complications related to urinary retention ?Outcome: Adequate for Discharge ?  ?Problem: Pain Managment: ?Goal: General experience of comfort will improve ?Outcome: Adequate for Discharge ?  ?Problem: Safety: ?Goal: Ability to remain free from injury will improve ?Outcome: Adequate for Discharge ?  ?Problem: Skin Integrity: ?Goal: Risk for impaired skin integrity will decrease ?Outcome: Adequate for Discharge ?  ?Problem: Education: ?Goal: Verbalization of understanding the information provided (i.e., activity precautions, restrictions, etc) will improve ?Outcome: Adequate for Discharge ?Goal: Individualized Educational Video(s) ?Outcome: Adequate for Discharge ?   ?Problem: Activity: ?Goal: Ability to ambulate and perform ADLs will improve ?Outcome: Adequate for Discharge ?  ?Problem: Clinical Measurements: ?Goal: Postoperative complications will be avoided or minimized ?Outcome: Adequate for Discharge ?  ?Problem: Self-Concept: ?Goal: Ability to maintain and perform role responsibilities to the fullest extent possible will improve ?Outcome: Adequate for Discharge ?  ?Problem: Pain Management: ?Goal: Pain level will decrease ?Outcome: Adequate for Discharge ?  ?

## 2022-04-01 NOTE — TOC Progression Note (Addendum)
Transition of Care (TOC) - Progression Note  ? ? ?Patient Details  ?Name: Mackenzie Green ?MRN: [id number] ?Date of Birth: January 28, 1946 ? ?Transition of Care (TOC) CM/SW Contact  ?Joanne Chars, LCSW ?Phone Number: ?04/01/2022, 8:53 AM ? ?Clinical Narrative:    ?Level 2 docs uploaded to [place] [facility]. ?CSW met with pt to discuss bed offers.  Pt accepts bed offer at [facility].  CSW confirmed with [facility] that they can accept pt today.  [facility] only has semiprivate room, pt informed and still wants to accept offer. ?Auth request submitted in [place] and approved: [id number], 3 days, 4/10-4/12.  ? ?0945: PASSR [id number] E ? ? ? ?Expected Discharge Plan: [place] ?Barriers to Discharge: Continued Medical Work up, Other (must enter comment) (pt asking for possible CIR admit, prefers to SNF) ? ?Expected Discharge Plan and Services ?Expected Discharge Plan: [place] ?In-house Referral: Clinical Social Work ?  ?Post Acute Care Choice: IP Rehab ?Living arrangements for the past 2 months: [place] ?                ?  ?  ?  ?  ?  ?  ?  ?  ?  ?  ? ? ?Social Determinants of Health (SDOH) Interventions ?  ? ?Readmission Risk Interventions ?   ? View : No data to display.  ?  ?  ?  ? ? ?

## 2022-04-02 DIAGNOSIS — M545 Low back pain, unspecified: Secondary | ICD-10-CM | POA: Diagnosis not present

## 2022-04-02 DIAGNOSIS — M25552 Pain in left hip: Secondary | ICD-10-CM | POA: Diagnosis not present

## 2022-04-02 DIAGNOSIS — S72002D Fracture of unspecified part of neck of left femur, subsequent encounter for closed fracture with routine healing: Secondary | ICD-10-CM | POA: Diagnosis not present

## 2022-04-03 DIAGNOSIS — Z7689 Persons encountering health services in other specified circumstances: Secondary | ICD-10-CM | POA: Diagnosis not present

## 2022-04-03 DIAGNOSIS — M62838 Other muscle spasm: Secondary | ICD-10-CM | POA: Diagnosis not present

## 2022-04-08 DIAGNOSIS — N390 Urinary tract infection, site not specified: Secondary | ICD-10-CM | POA: Diagnosis not present

## 2022-04-09 DIAGNOSIS — S70312A Abrasion, left thigh, initial encounter: Secondary | ICD-10-CM | POA: Diagnosis not present

## 2022-04-09 DIAGNOSIS — N390 Urinary tract infection, site not specified: Secondary | ICD-10-CM | POA: Diagnosis not present

## 2022-04-09 DIAGNOSIS — M62838 Other muscle spasm: Secondary | ICD-10-CM | POA: Diagnosis not present

## 2022-04-09 DIAGNOSIS — L853 Xerosis cutis: Secondary | ICD-10-CM | POA: Diagnosis not present

## 2022-04-09 DIAGNOSIS — Z7689 Persons encountering health services in other specified circumstances: Secondary | ICD-10-CM | POA: Diagnosis not present

## 2022-04-10 DIAGNOSIS — L853 Xerosis cutis: Secondary | ICD-10-CM | POA: Diagnosis not present

## 2022-04-10 DIAGNOSIS — S72002D Fracture of unspecified part of neck of left femur, subsequent encounter for closed fracture with routine healing: Secondary | ICD-10-CM | POA: Diagnosis not present

## 2022-04-10 DIAGNOSIS — M25552 Pain in left hip: Secondary | ICD-10-CM | POA: Diagnosis not present

## 2022-04-10 DIAGNOSIS — S70312D Abrasion, left thigh, subsequent encounter: Secondary | ICD-10-CM | POA: Diagnosis not present

## 2022-04-10 DIAGNOSIS — Z8719 Personal history of other diseases of the digestive system: Secondary | ICD-10-CM | POA: Diagnosis not present

## 2022-04-10 DIAGNOSIS — N390 Urinary tract infection, site not specified: Secondary | ICD-10-CM | POA: Diagnosis not present

## 2022-04-15 DIAGNOSIS — R600 Localized edema: Secondary | ICD-10-CM | POA: Diagnosis not present

## 2022-04-15 DIAGNOSIS — N390 Urinary tract infection, site not specified: Secondary | ICD-10-CM | POA: Diagnosis not present

## 2022-04-15 DIAGNOSIS — M6281 Muscle weakness (generalized): Secondary | ICD-10-CM | POA: Diagnosis not present

## 2022-04-15 DIAGNOSIS — S72002D Fracture of unspecified part of neck of left femur, subsequent encounter for closed fracture with routine healing: Secondary | ICD-10-CM | POA: Diagnosis not present

## 2022-04-16 ENCOUNTER — Other Ambulatory Visit: Payer: Self-pay | Admitting: *Deleted

## 2022-04-16 DIAGNOSIS — S72002D Fracture of unspecified part of neck of left femur, subsequent encounter for closed fracture with routine healing: Secondary | ICD-10-CM | POA: Diagnosis not present

## 2022-04-16 DIAGNOSIS — R2689 Other abnormalities of gait and mobility: Secondary | ICD-10-CM | POA: Diagnosis not present

## 2022-04-16 DIAGNOSIS — M6281 Muscle weakness (generalized): Secondary | ICD-10-CM | POA: Diagnosis not present

## 2022-04-16 DIAGNOSIS — Z9181 History of falling: Secondary | ICD-10-CM | POA: Diagnosis not present

## 2022-04-16 NOTE — Patient Outreach (Addendum)
[facility] Post- Acute Care Coordinator follow up. Per [place] eligible member currently resides in  [facility] SNF.  Screened for potential post SNF care coordination/care coordination needs as a benefit of [employer] plan. ? ?Facility site visit at [facility]. Spoke with [facility] in Admissions who reports member's transition plan is to return home.  ? ?Member's PCP at [facility] at [facility] has [facility] care management services. ? ?No identifiable post SNF care coordination/care management needs at this time.  ? ?Mackenzie Rolling, MSN, RN,BSN ?[place] Coordinator ?[phone] [facility]) ?[phone]  (Toll free office)  ?

## 2022-04-20 DIAGNOSIS — S72002D Fracture of unspecified part of neck of left femur, subsequent encounter for closed fracture with routine healing: Secondary | ICD-10-CM | POA: Diagnosis not present

## 2022-04-20 DIAGNOSIS — Z96642 Presence of left artificial hip joint: Secondary | ICD-10-CM | POA: Diagnosis not present

## 2022-04-20 DIAGNOSIS — M48061 Spinal stenosis, lumbar region without neurogenic claudication: Secondary | ICD-10-CM | POA: Diagnosis not present

## 2022-04-20 DIAGNOSIS — M545 Low back pain, unspecified: Secondary | ICD-10-CM | POA: Diagnosis not present

## 2022-04-20 DIAGNOSIS — G3184 Mild cognitive impairment, so stated: Secondary | ICD-10-CM | POA: Diagnosis not present

## 2022-04-20 DIAGNOSIS — D509 Iron deficiency anemia, unspecified: Secondary | ICD-10-CM | POA: Diagnosis not present

## 2022-04-20 DIAGNOSIS — Z7982 Long term (current) use of aspirin: Secondary | ICD-10-CM | POA: Diagnosis not present

## 2022-04-22 DIAGNOSIS — G3184 Mild cognitive impairment, so stated: Secondary | ICD-10-CM | POA: Diagnosis not present

## 2022-04-22 DIAGNOSIS — M545 Low back pain, unspecified: Secondary | ICD-10-CM | POA: Diagnosis not present

## 2022-04-22 DIAGNOSIS — Z7982 Long term (current) use of aspirin: Secondary | ICD-10-CM | POA: Diagnosis not present

## 2022-04-22 DIAGNOSIS — M48061 Spinal stenosis, lumbar region without neurogenic claudication: Secondary | ICD-10-CM | POA: Diagnosis not present

## 2022-04-22 DIAGNOSIS — D509 Iron deficiency anemia, unspecified: Secondary | ICD-10-CM | POA: Diagnosis not present

## 2022-04-22 DIAGNOSIS — S72002D Fracture of unspecified part of neck of left femur, subsequent encounter for closed fracture with routine healing: Secondary | ICD-10-CM | POA: Diagnosis not present

## 2022-04-22 DIAGNOSIS — Z96642 Presence of left artificial hip joint: Secondary | ICD-10-CM | POA: Diagnosis not present

## 2022-04-23 DIAGNOSIS — M48061 Spinal stenosis, lumbar region without neurogenic claudication: Secondary | ICD-10-CM | POA: Diagnosis not present

## 2022-04-23 DIAGNOSIS — Z7982 Long term (current) use of aspirin: Secondary | ICD-10-CM | POA: Diagnosis not present

## 2022-04-23 DIAGNOSIS — Z96642 Presence of left artificial hip joint: Secondary | ICD-10-CM | POA: Diagnosis not present

## 2022-04-23 DIAGNOSIS — M545 Low back pain, unspecified: Secondary | ICD-10-CM | POA: Diagnosis not present

## 2022-04-23 DIAGNOSIS — G3184 Mild cognitive impairment, so stated: Secondary | ICD-10-CM | POA: Diagnosis not present

## 2022-04-23 DIAGNOSIS — D509 Iron deficiency anemia, unspecified: Secondary | ICD-10-CM | POA: Diagnosis not present

## 2022-04-23 DIAGNOSIS — S72002D Fracture of unspecified part of neck of left femur, subsequent encounter for closed fracture with routine healing: Secondary | ICD-10-CM | POA: Diagnosis not present

## 2022-04-24 DIAGNOSIS — Z7982 Long term (current) use of aspirin: Secondary | ICD-10-CM | POA: Diagnosis not present

## 2022-04-24 DIAGNOSIS — D509 Iron deficiency anemia, unspecified: Secondary | ICD-10-CM | POA: Diagnosis not present

## 2022-04-24 DIAGNOSIS — Z96642 Presence of left artificial hip joint: Secondary | ICD-10-CM | POA: Diagnosis not present

## 2022-04-24 DIAGNOSIS — G3184 Mild cognitive impairment, so stated: Secondary | ICD-10-CM | POA: Diagnosis not present

## 2022-04-24 DIAGNOSIS — M545 Low back pain, unspecified: Secondary | ICD-10-CM | POA: Diagnosis not present

## 2022-04-24 DIAGNOSIS — M48061 Spinal stenosis, lumbar region without neurogenic claudication: Secondary | ICD-10-CM | POA: Diagnosis not present

## 2022-04-24 DIAGNOSIS — S72002D Fracture of unspecified part of neck of left femur, subsequent encounter for closed fracture with routine healing: Secondary | ICD-10-CM | POA: Diagnosis not present

## 2022-04-26 DIAGNOSIS — S72002D Fracture of unspecified part of neck of left femur, subsequent encounter for closed fracture with routine healing: Secondary | ICD-10-CM | POA: Diagnosis not present

## 2022-04-26 DIAGNOSIS — M48061 Spinal stenosis, lumbar region without neurogenic claudication: Secondary | ICD-10-CM | POA: Diagnosis not present

## 2022-04-26 DIAGNOSIS — Z96642 Presence of left artificial hip joint: Secondary | ICD-10-CM | POA: Diagnosis not present

## 2022-04-26 DIAGNOSIS — Z7982 Long term (current) use of aspirin: Secondary | ICD-10-CM | POA: Diagnosis not present

## 2022-04-26 DIAGNOSIS — G3184 Mild cognitive impairment, so stated: Secondary | ICD-10-CM | POA: Diagnosis not present

## 2022-04-26 DIAGNOSIS — M545 Low back pain, unspecified: Secondary | ICD-10-CM | POA: Diagnosis not present

## 2022-04-26 DIAGNOSIS — D509 Iron deficiency anemia, unspecified: Secondary | ICD-10-CM | POA: Diagnosis not present

## 2022-04-29 DIAGNOSIS — N3000 Acute cystitis without hematuria: Secondary | ICD-10-CM | POA: Diagnosis not present

## 2022-04-29 DIAGNOSIS — Z96642 Presence of left artificial hip joint: Secondary | ICD-10-CM | POA: Diagnosis not present

## 2022-04-29 DIAGNOSIS — R350 Frequency of micturition: Secondary | ICD-10-CM | POA: Diagnosis not present

## 2022-04-29 DIAGNOSIS — M25652 Stiffness of left hip, not elsewhere classified: Secondary | ICD-10-CM | POA: Diagnosis not present

## 2022-05-01 DIAGNOSIS — M25652 Stiffness of left hip, not elsewhere classified: Secondary | ICD-10-CM | POA: Diagnosis not present

## 2022-05-01 DIAGNOSIS — Z96642 Presence of left artificial hip joint: Secondary | ICD-10-CM | POA: Diagnosis not present

## 2022-05-06 DIAGNOSIS — S72002D Fracture of unspecified part of neck of left femur, subsequent encounter for closed fracture with routine healing: Secondary | ICD-10-CM | POA: Diagnosis not present

## 2022-05-06 DIAGNOSIS — Z96642 Presence of left artificial hip joint: Secondary | ICD-10-CM | POA: Diagnosis not present

## 2022-05-06 DIAGNOSIS — M25652 Stiffness of left hip, not elsewhere classified: Secondary | ICD-10-CM | POA: Diagnosis not present

## 2022-05-07 DIAGNOSIS — C44329 Squamous cell carcinoma of skin of other parts of face: Secondary | ICD-10-CM | POA: Diagnosis not present

## 2022-05-07 DIAGNOSIS — N390 Urinary tract infection, site not specified: Secondary | ICD-10-CM | POA: Diagnosis not present

## 2022-05-08 DIAGNOSIS — M25652 Stiffness of left hip, not elsewhere classified: Secondary | ICD-10-CM | POA: Diagnosis not present

## 2022-05-08 DIAGNOSIS — Z96642 Presence of left artificial hip joint: Secondary | ICD-10-CM | POA: Diagnosis not present

## 2022-05-10 DIAGNOSIS — Z96642 Presence of left artificial hip joint: Secondary | ICD-10-CM | POA: Diagnosis not present

## 2022-05-10 DIAGNOSIS — M25652 Stiffness of left hip, not elsewhere classified: Secondary | ICD-10-CM | POA: Diagnosis not present

## 2022-05-11 IMAGING — CT CT L SPINE W/O CM
3 of 6 series · 14 of 33 positions shown, 16 images · non-contrast
Comparison: MRI 03/13/2005

CLINICAL DATA: Low back pain.



[Series 5: l spine soft · axial · 0.32mm/px · z∈[+902,+1102]mm · 8 of 120 slices shown, 10 images]
[im 10/120  soft-tissue]
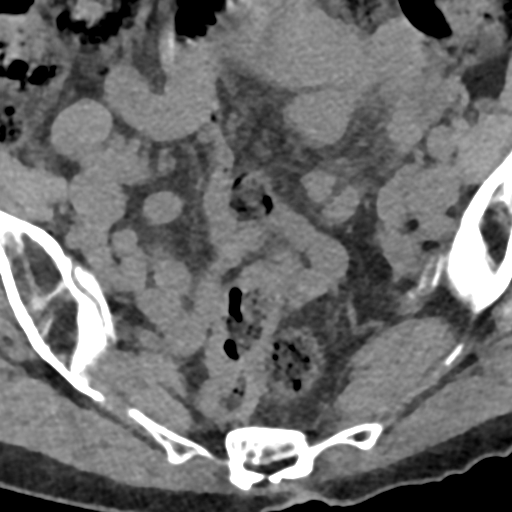
[im 10/120  bone]
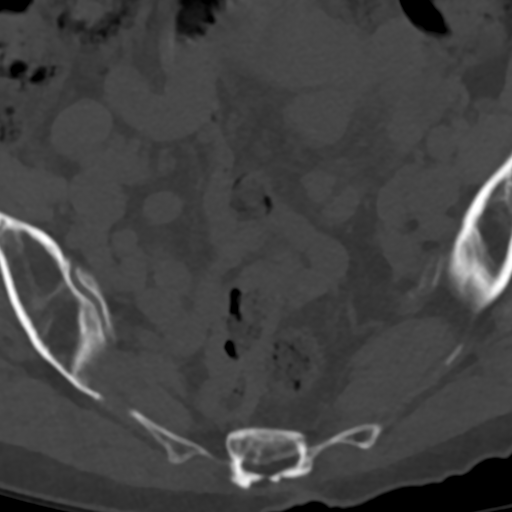
[im 28/120  bone]
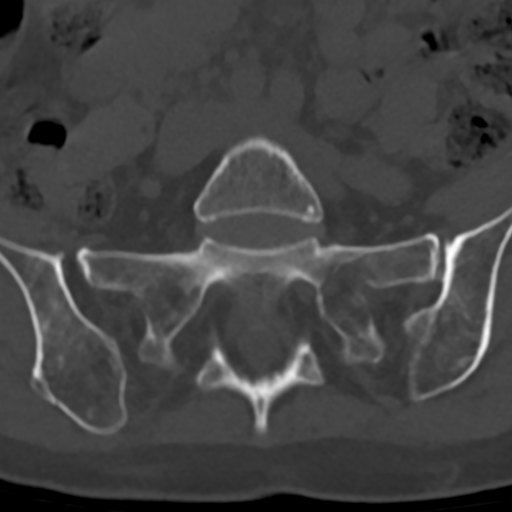
[im 37/120  bone]
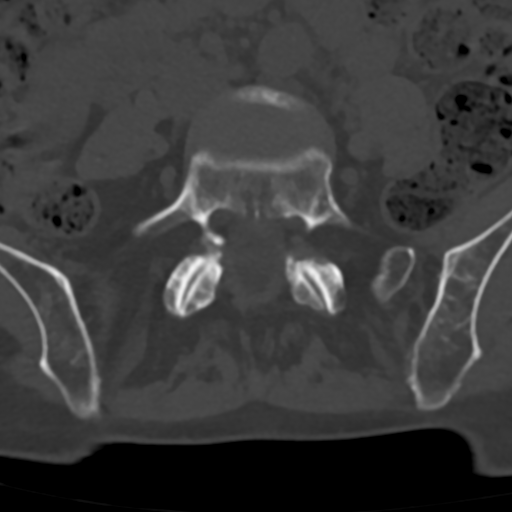
[im 55/120  bone]
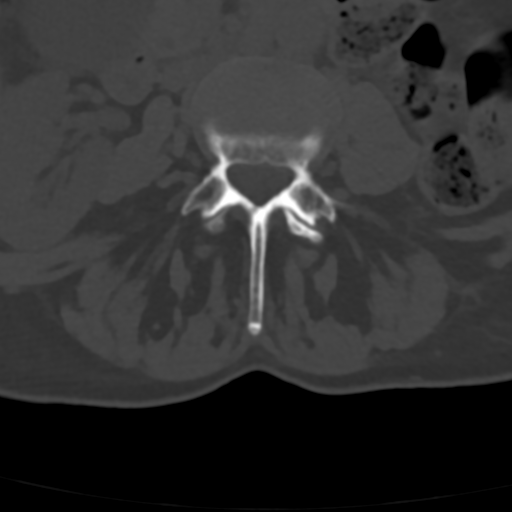
[im 65/120  soft-tissue]
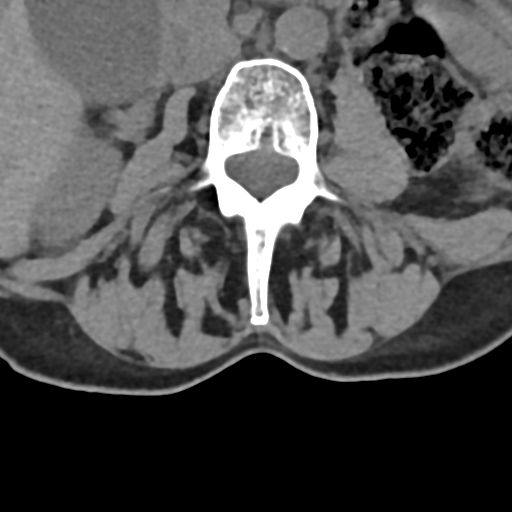
[im 65/120  bone]
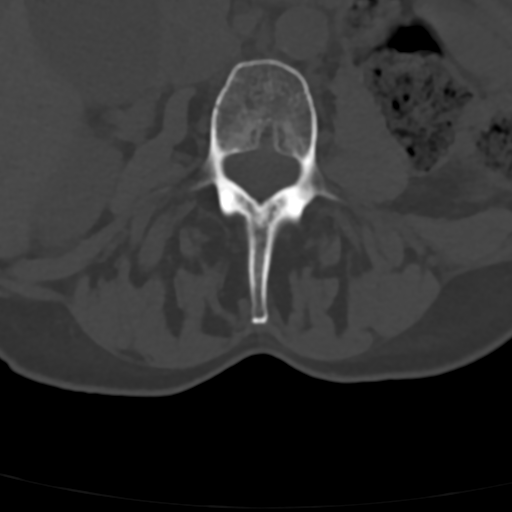
[im 83/120  bone]
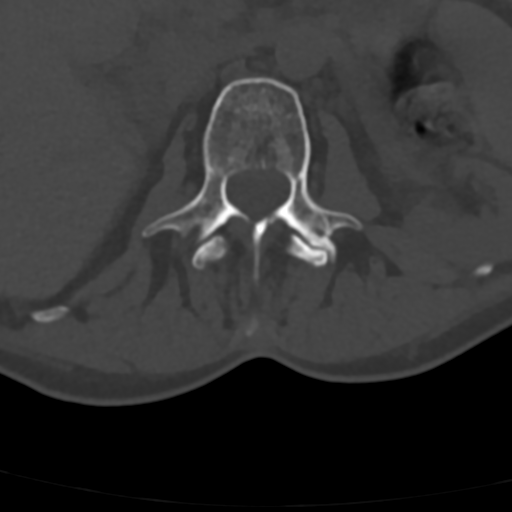
[im 92/120  bone]
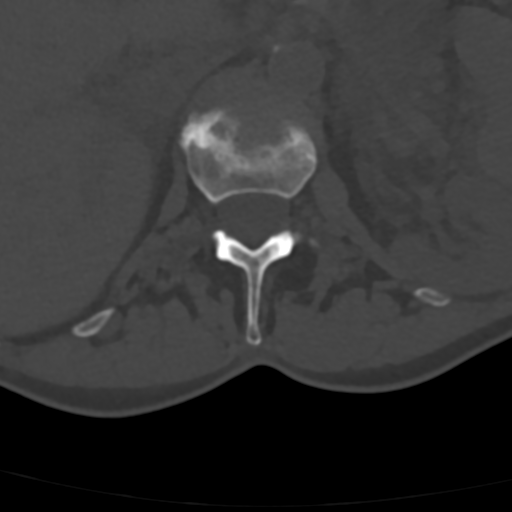
[im 110/120  bone]
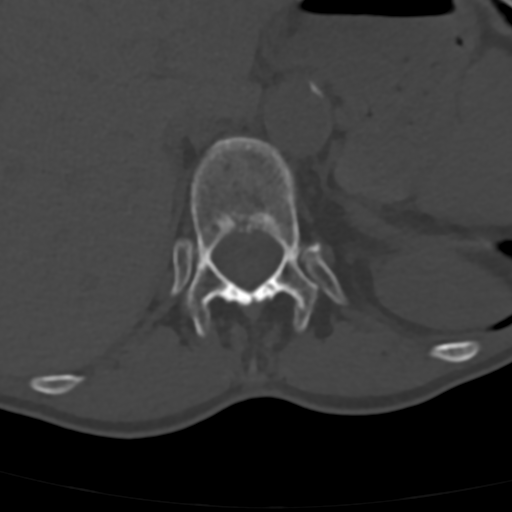

[Series 7: sag bone · sagittal · 0.33mm/px · 5 of 64 slices shown]
[im 11/64  bone]
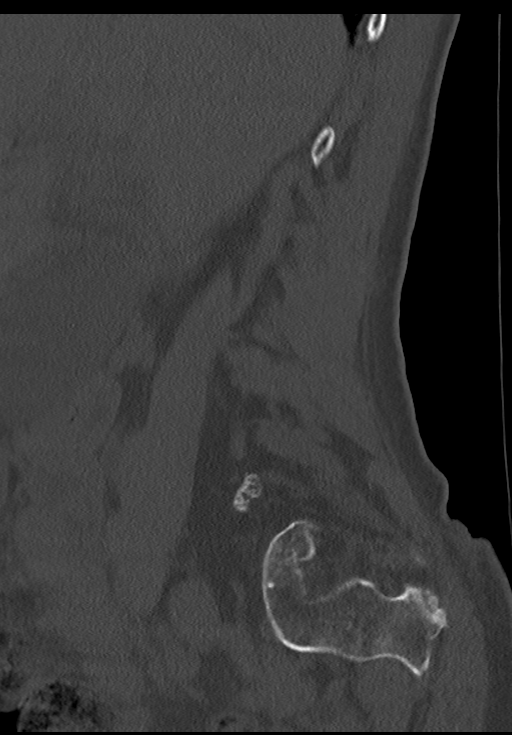
[im 22/64  bone]
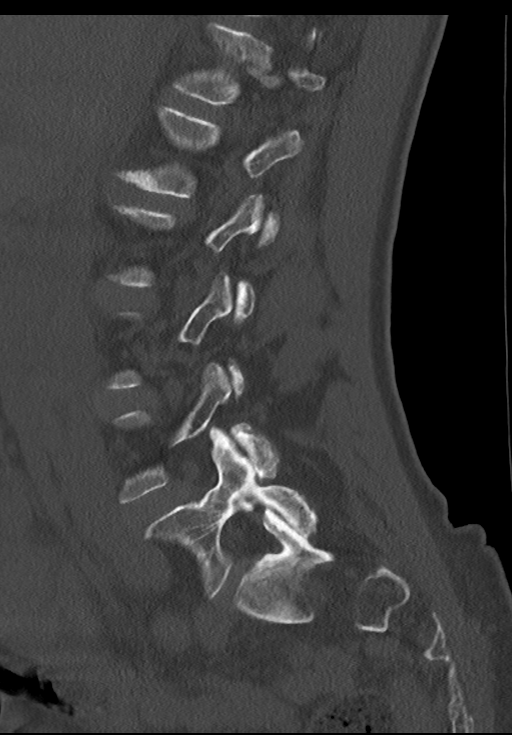
[im 32/64  bone]
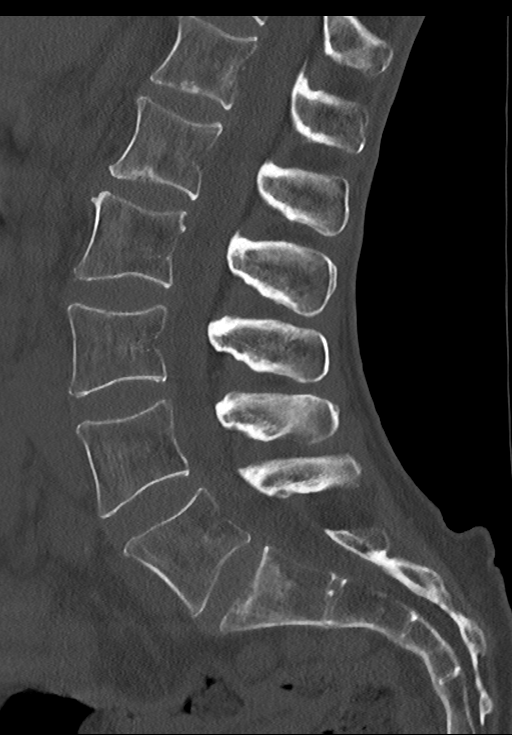
[im 43/64  bone]
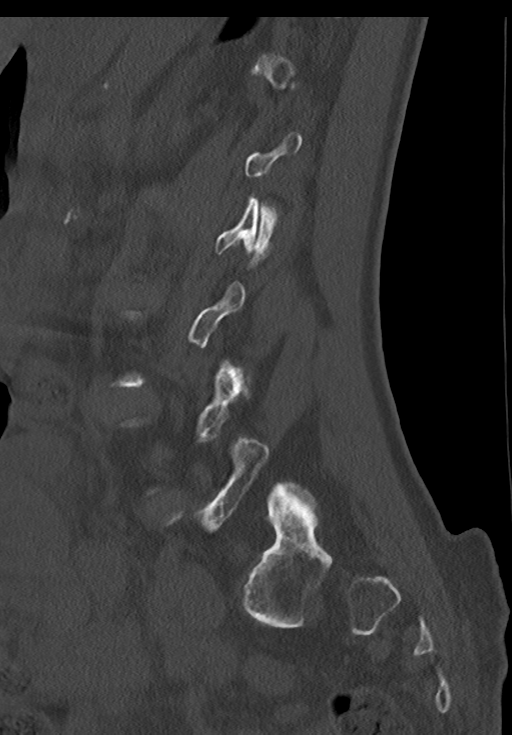
[im 53/64  bone]
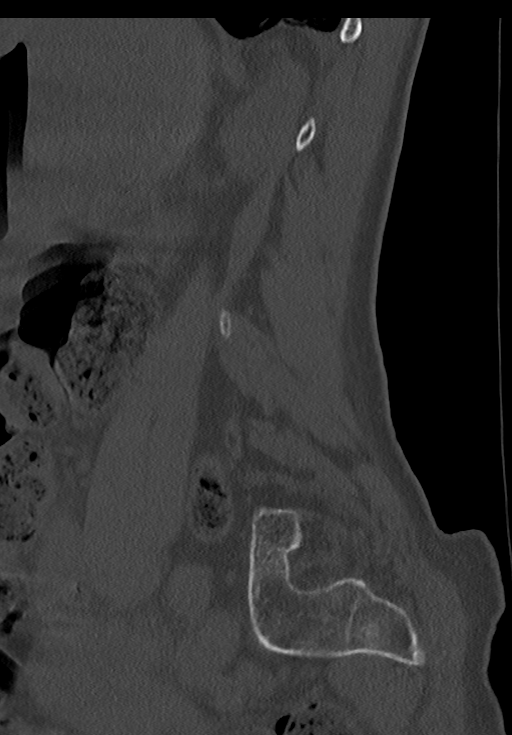

[Series 9: cor st · coronal · 0.25mm/px · 1 of 86 slices shown]
[im 43/86  bone]
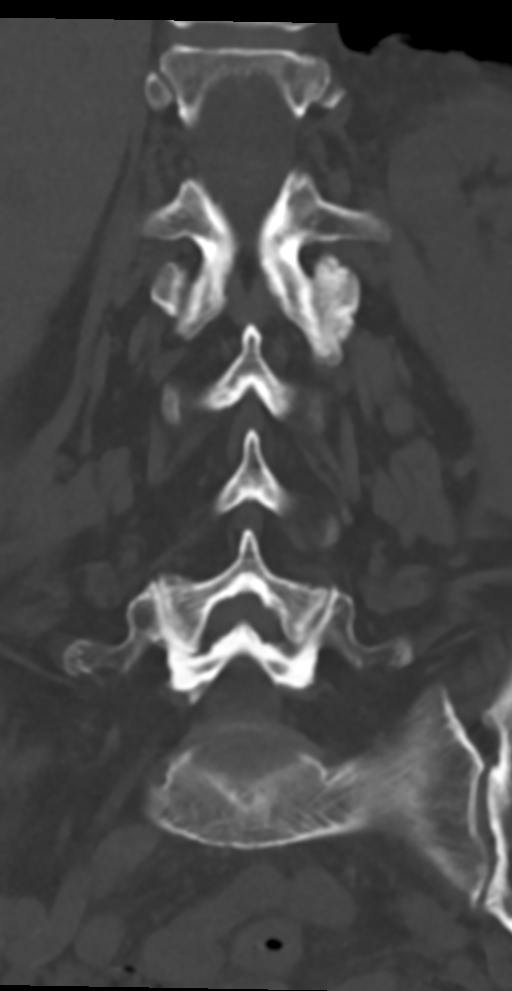

[14 of 33 positions shown; findings below may reference images not displayed]

FINDINGS: Segmentation: There are five lumbar type vertebral bodies. The last
full intervertebral disc space is labeled L5-S1.

Alignment: Normal

Vertebrae: Moderate osteoporosis but no bone lesion or fracture.
Facets are normally aligned. No pars defects.

Paraspinal and other soft tissues: No significant paraspinal or
retroperitoneal findings. Scattered aortic calcifications but no
aneurysm.

Disc levels: Mild bulging discs but no focal disc protrusions,
significant spinal at T12-L1,L1-2, L2-3 or L3-4.

L4-5: Broad-based disc protrusion asymmetric left. This in
combination with facet disease and ligamentum flavum thickening
creates moderate spinal and bilateral lateral recess stenosis. There
is also a left foraminal component which appears to contact and
slightly displace the left L4 nerve root. Recommend correlation with
left L4 radiculopathy.

L5-S1: Mild annular bulge with mild impression on the ventral thecal
sac but no significant spinal foraminal stenosis.
IMPRESSION: Broad-based disc protrusion asymmetric left at L4-5 contributing to
moderate spinal and bilateral lateral recess stenosis. There is also
a left foraminal component which appears to contact and slightly
displace the left L4 nerve root. Recommend correlation with left L4
radiculopathy.

No acute bony findings or bone lesions.

Aortic Atherosclerosis ([id number]-A05.5).

## 2022-05-12 IMAGING — DX DG HIP (WITH OR WITHOUT PELVIS) 2-3V*L*
3 series · 3 of 3 positions shown · non-contrast
Comparison: 03/27/2022.

CLINICAL DATA: Postoperative.

EXAM:
DG HIP (WITH OR WITHOUT PELVIS) 2-3V LEFT

[pelvis ap]
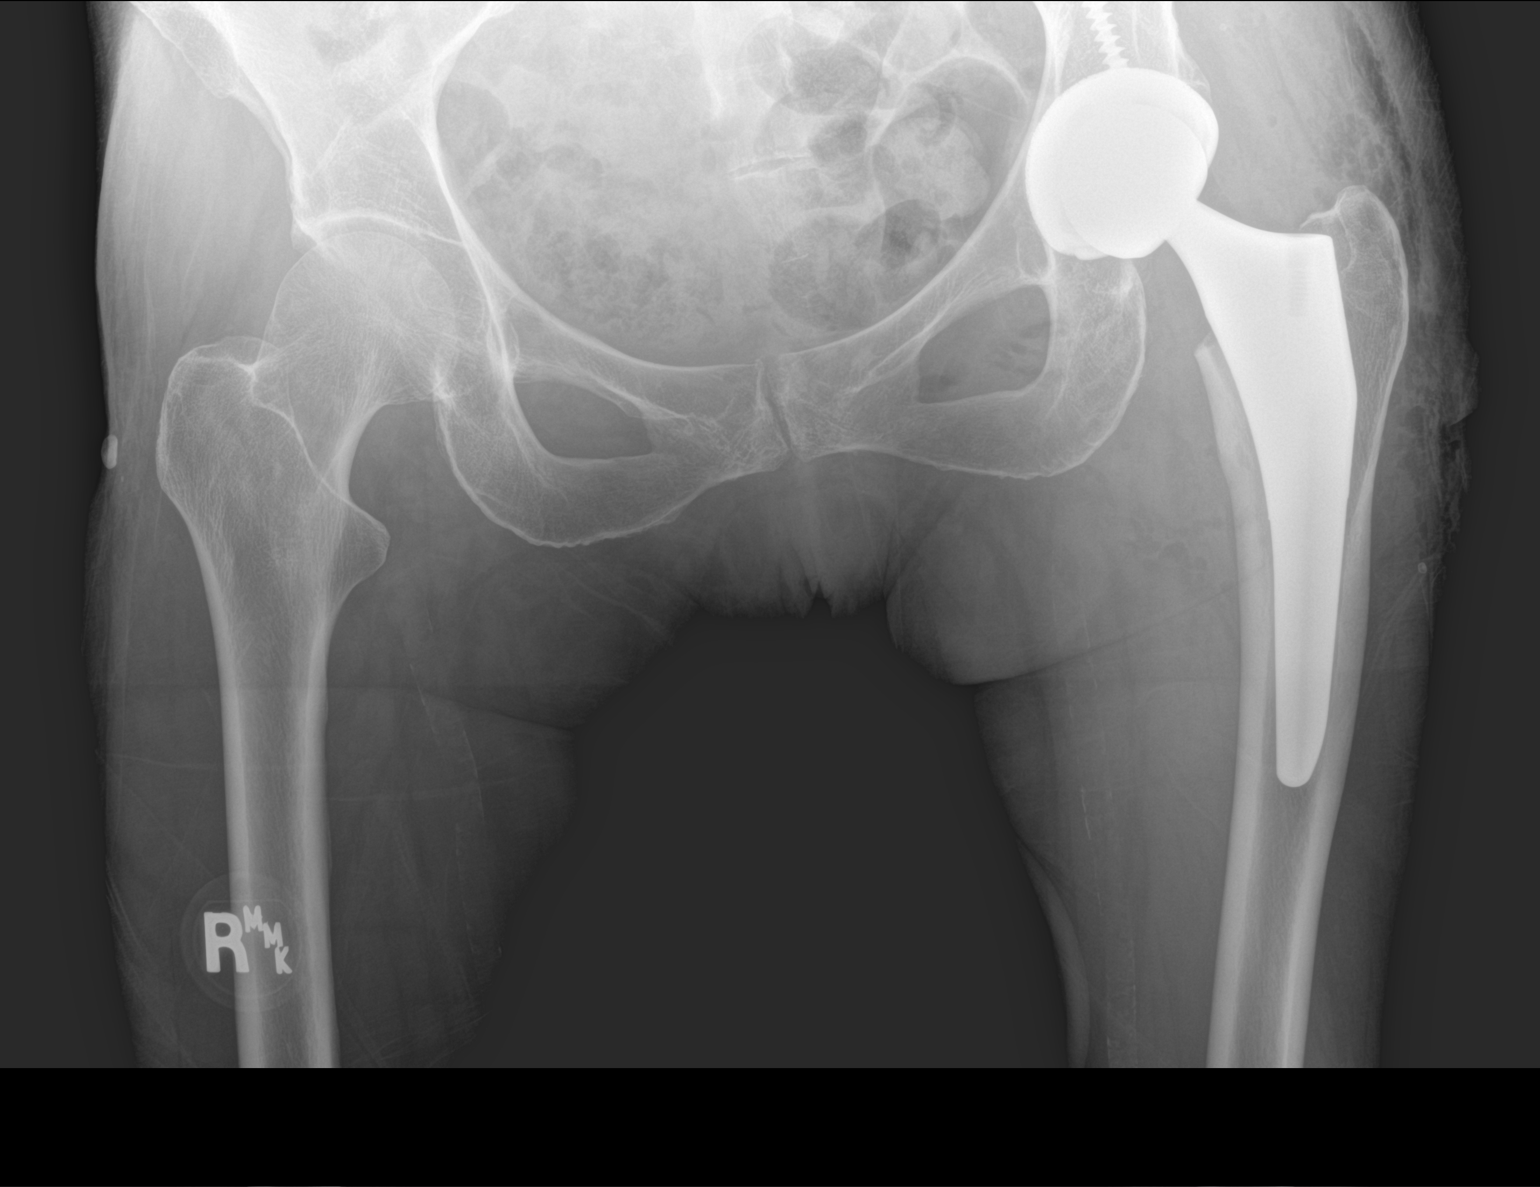

[hip ap]
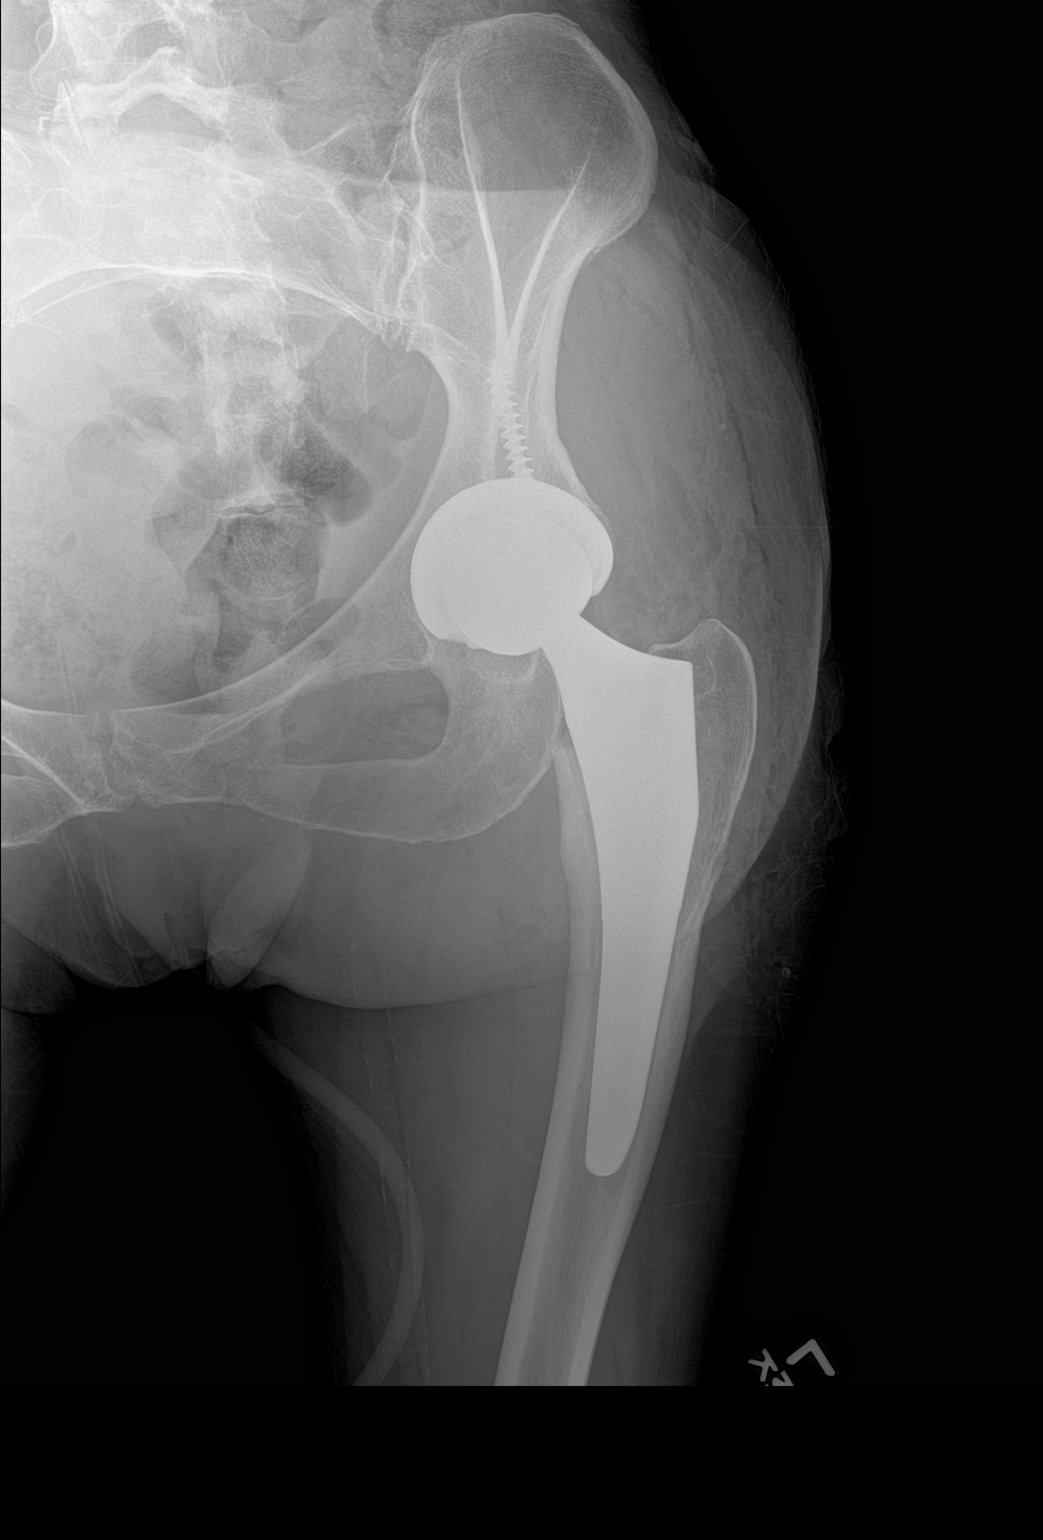

[hip lat]
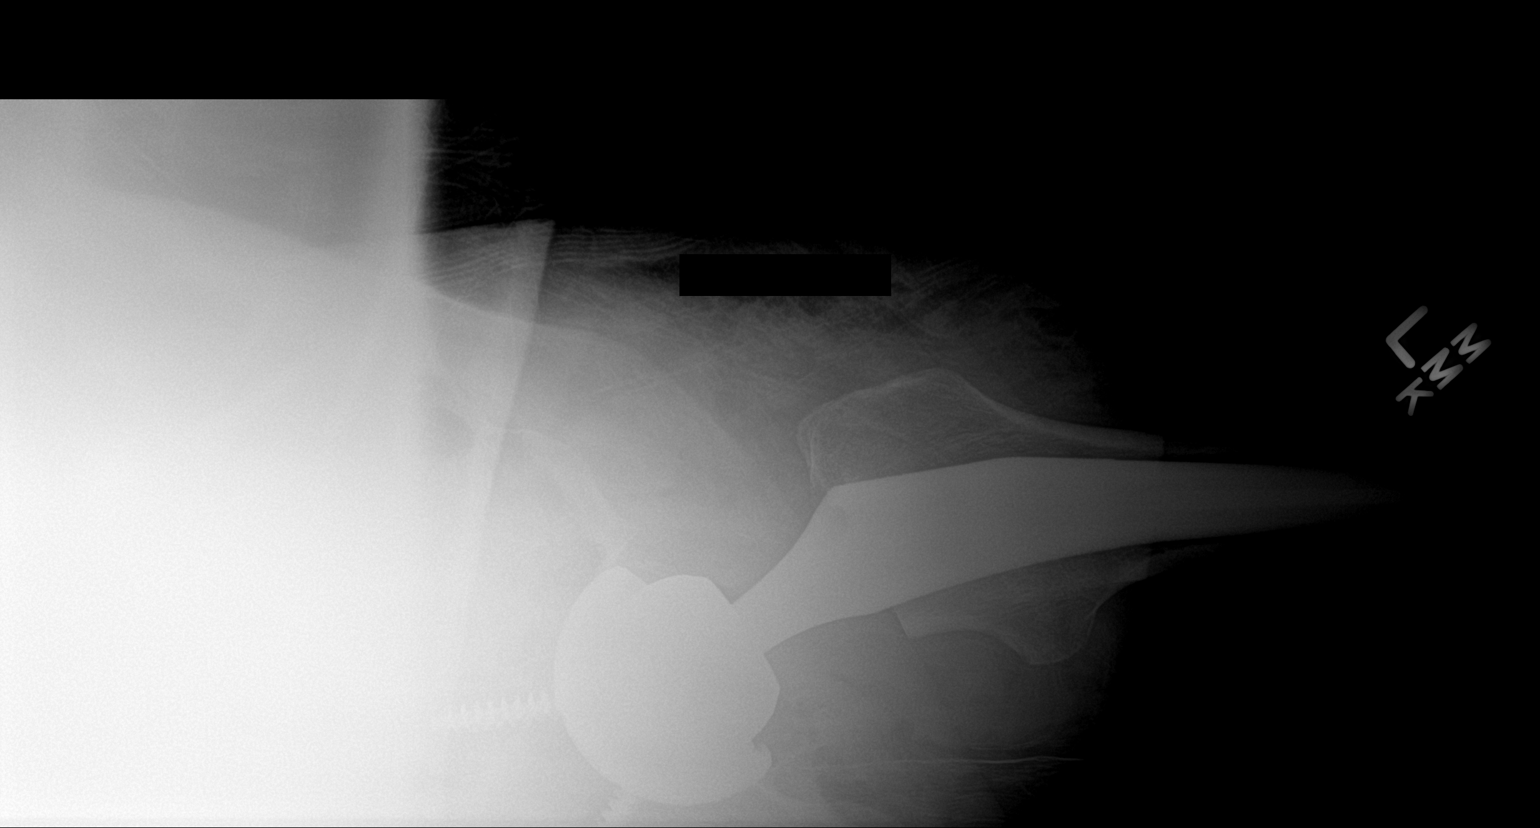

[3 of 3 positions shown; findings below may reference images not displayed]

FINDINGS: There is a left hip arthroplasty in anatomic alignment. No evidence
for fracture. There is surrounding soft tissue swelling and air
compatible with recent surgery.
IMPRESSION: 1. Left hip arthroplasty in anatomic alignment with recent
postoperative changes.

## 2022-05-12 IMAGING — CR DG HIP (WITH OR WITHOUT PELVIS) 1V PORT*L*
1 series · 1 of 1 positions shown · non-contrast
Comparison: None.

CLINICAL DATA: Intraoperative left hip for left total hip
arthroplasty.

EXAM:
DG HIP (WITH OR WITHOUT PELVIS) 1V PORT LEFT

[xtable lateral]
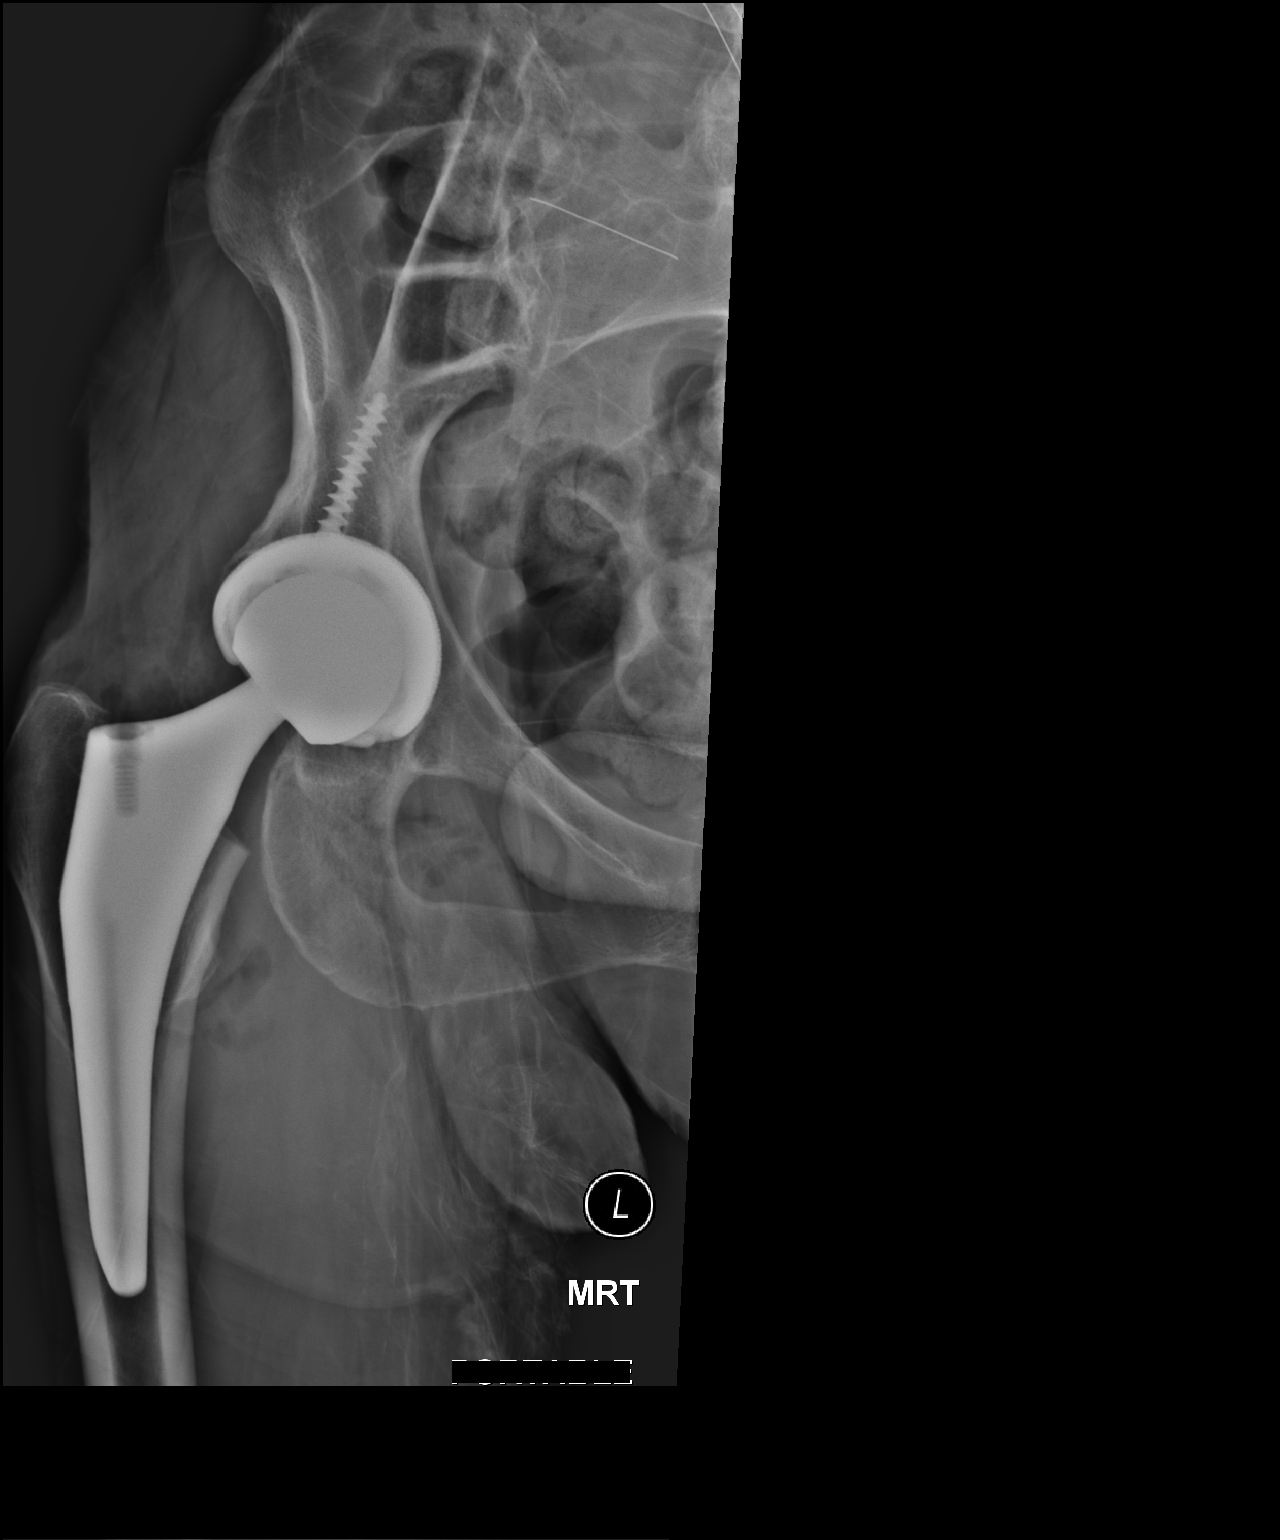

[1 of 1 positions shown; findings below may reference images not displayed]

FINDINGS: A single portable cross-table lateral view of the left hip is
obtained demonstrating interval placement of a left hip arthroplasty
using non cemented components. Screw fixation of the acetabular cup.
Components appear well seated. Soft tissue gas is consistent with
recent surgery.
IMPRESSION: Left total hip arthroplasty.  No acute complication is indicated.

## 2022-05-13 DIAGNOSIS — Z96642 Presence of left artificial hip joint: Secondary | ICD-10-CM | POA: Diagnosis not present

## 2022-05-13 DIAGNOSIS — M25652 Stiffness of left hip, not elsewhere classified: Secondary | ICD-10-CM | POA: Diagnosis not present

## 2022-05-15 DIAGNOSIS — Z96642 Presence of left artificial hip joint: Secondary | ICD-10-CM | POA: Diagnosis not present

## 2022-05-15 DIAGNOSIS — M25652 Stiffness of left hip, not elsewhere classified: Secondary | ICD-10-CM | POA: Diagnosis not present

## 2022-05-16 DIAGNOSIS — R3915 Urgency of urination: Secondary | ICD-10-CM | POA: Diagnosis not present

## 2022-05-16 DIAGNOSIS — R350 Frequency of micturition: Secondary | ICD-10-CM | POA: Diagnosis not present

## 2022-05-16 DIAGNOSIS — S72002D Fracture of unspecified part of neck of left femur, subsequent encounter for closed fracture with routine healing: Secondary | ICD-10-CM | POA: Diagnosis not present

## 2022-05-17 DIAGNOSIS — M25652 Stiffness of left hip, not elsewhere classified: Secondary | ICD-10-CM | POA: Diagnosis not present

## 2022-05-17 DIAGNOSIS — Z96642 Presence of left artificial hip joint: Secondary | ICD-10-CM | POA: Diagnosis not present

## 2022-05-21 DIAGNOSIS — Z96642 Presence of left artificial hip joint: Secondary | ICD-10-CM | POA: Diagnosis not present

## 2022-05-21 DIAGNOSIS — M25652 Stiffness of left hip, not elsewhere classified: Secondary | ICD-10-CM | POA: Diagnosis not present

## 2022-05-23 DIAGNOSIS — Z96642 Presence of left artificial hip joint: Secondary | ICD-10-CM | POA: Diagnosis not present

## 2022-05-23 DIAGNOSIS — M25652 Stiffness of left hip, not elsewhere classified: Secondary | ICD-10-CM | POA: Diagnosis not present

## 2022-05-27 DIAGNOSIS — M25652 Stiffness of left hip, not elsewhere classified: Secondary | ICD-10-CM | POA: Diagnosis not present

## 2022-05-27 DIAGNOSIS — Z96642 Presence of left artificial hip joint: Secondary | ICD-10-CM | POA: Diagnosis not present

## 2022-05-31 DIAGNOSIS — M25652 Stiffness of left hip, not elsewhere classified: Secondary | ICD-10-CM | POA: Diagnosis not present

## 2022-05-31 DIAGNOSIS — Z96642 Presence of left artificial hip joint: Secondary | ICD-10-CM | POA: Diagnosis not present

## 2022-06-03 DIAGNOSIS — M25652 Stiffness of left hip, not elsewhere classified: Secondary | ICD-10-CM | POA: Diagnosis not present

## 2022-06-03 DIAGNOSIS — Z96642 Presence of left artificial hip joint: Secondary | ICD-10-CM | POA: Diagnosis not present

## 2022-06-05 DIAGNOSIS — M25652 Stiffness of left hip, not elsewhere classified: Secondary | ICD-10-CM | POA: Diagnosis not present

## 2022-06-05 DIAGNOSIS — Z96642 Presence of left artificial hip joint: Secondary | ICD-10-CM | POA: Diagnosis not present

## 2022-06-07 DIAGNOSIS — M25652 Stiffness of left hip, not elsewhere classified: Secondary | ICD-10-CM | POA: Diagnosis not present

## 2022-06-07 DIAGNOSIS — Z96642 Presence of left artificial hip joint: Secondary | ICD-10-CM | POA: Diagnosis not present

## 2022-06-11 DIAGNOSIS — Z96642 Presence of left artificial hip joint: Secondary | ICD-10-CM | POA: Diagnosis not present

## 2022-06-11 DIAGNOSIS — M25652 Stiffness of left hip, not elsewhere classified: Secondary | ICD-10-CM | POA: Diagnosis not present

## 2022-06-17 DIAGNOSIS — M25652 Stiffness of left hip, not elsewhere classified: Secondary | ICD-10-CM | POA: Diagnosis not present

## 2022-06-17 DIAGNOSIS — Z96642 Presence of left artificial hip joint: Secondary | ICD-10-CM | POA: Diagnosis not present

## 2022-06-18 DIAGNOSIS — C44329 Squamous cell carcinoma of skin of other parts of face: Secondary | ICD-10-CM | POA: Diagnosis not present

## 2022-06-18 DIAGNOSIS — Z96642 Presence of left artificial hip joint: Secondary | ICD-10-CM | POA: Diagnosis not present

## 2022-06-18 DIAGNOSIS — M25652 Stiffness of left hip, not elsewhere classified: Secondary | ICD-10-CM | POA: Diagnosis not present

## 2022-06-27 DIAGNOSIS — S72002D Fracture of unspecified part of neck of left femur, subsequent encounter for closed fracture with routine healing: Secondary | ICD-10-CM | POA: Diagnosis not present

## 2022-06-27 DIAGNOSIS — Z96642 Presence of left artificial hip joint: Secondary | ICD-10-CM | POA: Diagnosis not present

## 2022-06-27 DIAGNOSIS — M25652 Stiffness of left hip, not elsewhere classified: Secondary | ICD-10-CM | POA: Diagnosis not present

## 2022-07-01 DIAGNOSIS — M25652 Stiffness of left hip, not elsewhere classified: Secondary | ICD-10-CM | POA: Diagnosis not present

## 2022-07-01 DIAGNOSIS — Z96642 Presence of left artificial hip joint: Secondary | ICD-10-CM | POA: Diagnosis not present

## 2022-07-03 DIAGNOSIS — Z96642 Presence of left artificial hip joint: Secondary | ICD-10-CM | POA: Diagnosis not present

## 2022-07-03 DIAGNOSIS — M25652 Stiffness of left hip, not elsewhere classified: Secondary | ICD-10-CM | POA: Diagnosis not present

## 2022-07-09 DIAGNOSIS — Z96642 Presence of left artificial hip joint: Secondary | ICD-10-CM | POA: Diagnosis not present

## 2022-07-09 DIAGNOSIS — M25652 Stiffness of left hip, not elsewhere classified: Secondary | ICD-10-CM | POA: Diagnosis not present

## 2022-07-15 DIAGNOSIS — M8580 Other specified disorders of bone density and structure, unspecified site: Secondary | ICD-10-CM | POA: Diagnosis not present

## 2022-07-15 DIAGNOSIS — M25461 Effusion, right knee: Secondary | ICD-10-CM | POA: Diagnosis not present

## 2022-07-15 DIAGNOSIS — Z780 Asymptomatic menopausal state: Secondary | ICD-10-CM | POA: Diagnosis not present

## 2022-07-16 ENCOUNTER — Other Ambulatory Visit: Payer: Self-pay | Admitting: Family Medicine

## 2022-07-16 DIAGNOSIS — M8580 Other specified disorders of bone density and structure, unspecified site: Secondary | ICD-10-CM

## 2022-07-17 ENCOUNTER — Other Ambulatory Visit: Payer: Self-pay | Admitting: Family Medicine

## 2022-07-17 DIAGNOSIS — Z1231 Encounter for screening mammogram for malignant neoplasm of breast: Secondary | ICD-10-CM

## 2022-07-17 DIAGNOSIS — Z96642 Presence of left artificial hip joint: Secondary | ICD-10-CM | POA: Diagnosis not present

## 2022-07-17 DIAGNOSIS — M25652 Stiffness of left hip, not elsewhere classified: Secondary | ICD-10-CM | POA: Diagnosis not present

## 2022-07-19 DIAGNOSIS — Z96642 Presence of left artificial hip joint: Secondary | ICD-10-CM | POA: Diagnosis not present

## 2022-07-19 DIAGNOSIS — M25652 Stiffness of left hip, not elsewhere classified: Secondary | ICD-10-CM | POA: Diagnosis not present

## 2022-07-22 DIAGNOSIS — M8589 Other specified disorders of bone density and structure, multiple sites: Secondary | ICD-10-CM | POA: Diagnosis not present

## 2022-07-22 DIAGNOSIS — M810 Age-related osteoporosis without current pathological fracture: Secondary | ICD-10-CM | POA: Diagnosis not present

## 2022-07-22 DIAGNOSIS — Z780 Asymptomatic menopausal state: Secondary | ICD-10-CM | POA: Diagnosis not present

## 2022-08-07 ENCOUNTER — Ambulatory Visit
Admission: RE | Admit: 2022-08-07 | Discharge: 2022-08-07 | Disposition: A | Discharge status: Home / Self Care | Payer: Medicare Other | Source: Ambulatory Visit | Attending: Family Medicine | Admitting: Family Medicine

## 2022-08-07 DIAGNOSIS — Z1231 Encounter for screening mammogram for malignant neoplasm of breast: Secondary | ICD-10-CM | POA: Diagnosis not present

## 2022-08-09 ENCOUNTER — Other Ambulatory Visit: Payer: Self-pay | Admitting: Family Medicine

## 2022-08-09 DIAGNOSIS — R928 Other abnormal and inconclusive findings on diagnostic imaging of breast: Secondary | ICD-10-CM

## 2022-08-16 DIAGNOSIS — U071 COVID-19: Secondary | ICD-10-CM | POA: Diagnosis not present

## 2022-08-21 ENCOUNTER — Other Ambulatory Visit: Payer: Medicare Other

## 2022-08-23 ENCOUNTER — Telehealth: Payer: Self-pay | Admitting: Family Medicine

## 2022-08-23 DIAGNOSIS — M810 Age-related osteoporosis without current pathological fracture: Secondary | ICD-10-CM | POA: Diagnosis not present

## 2022-08-23 DIAGNOSIS — E559 Vitamin D deficiency, unspecified: Secondary | ICD-10-CM | POA: Diagnosis not present

## 2022-08-23 DIAGNOSIS — R5383 Other fatigue: Secondary | ICD-10-CM | POA: Diagnosis not present

## 2022-08-23 NOTE — Telephone Encounter (Signed)
Cld pt back regarding message left on office [facility] for  Osteoporosis appt w/ Dr. Raeford Razor-- returned pt's cll but she states waiting on appt w/ [facility].--will cb if  not successful

## 2022-08-29 ENCOUNTER — Ambulatory Visit
Admission: RE | Admit: 2022-08-29 | Discharge: 2022-08-29 | Disposition: A | Discharge status: Home / Self Care | Payer: Medicare Other | Source: Ambulatory Visit | Attending: Family Medicine | Admitting: Family Medicine

## 2022-08-29 ENCOUNTER — Ambulatory Visit: Admission: RE | Admit: 2022-08-29 | Payer: Medicare Other | Source: Ambulatory Visit

## 2022-08-29 DIAGNOSIS — R928 Other abnormal and inconclusive findings on diagnostic imaging of breast: Secondary | ICD-10-CM

## 2022-08-30 DIAGNOSIS — R3915 Urgency of urination: Secondary | ICD-10-CM | POA: Diagnosis not present

## 2022-08-30 DIAGNOSIS — M25569 Pain in unspecified knee: Secondary | ICD-10-CM | POA: Diagnosis not present

## 2022-08-30 DIAGNOSIS — M8588 Other specified disorders of bone density and structure, other site: Secondary | ICD-10-CM | POA: Diagnosis not present

## 2022-08-30 DIAGNOSIS — M25561 Pain in right knee: Secondary | ICD-10-CM | POA: Diagnosis not present

## 2022-08-30 DIAGNOSIS — R350 Frequency of micturition: Secondary | ICD-10-CM | POA: Diagnosis not present

## 2022-09-04 DIAGNOSIS — M25561 Pain in right knee: Secondary | ICD-10-CM | POA: Diagnosis not present

## 2022-09-04 DIAGNOSIS — M7051 Other bursitis of knee, right knee: Secondary | ICD-10-CM | POA: Diagnosis not present

## 2022-09-05 DIAGNOSIS — N8111 Cystocele, midline: Secondary | ICD-10-CM | POA: Diagnosis not present

## 2022-09-10 DIAGNOSIS — M810 Age-related osteoporosis without current pathological fracture: Secondary | ICD-10-CM | POA: Diagnosis not present

## 2022-09-19 DIAGNOSIS — N8111 Cystocele, midline: Secondary | ICD-10-CM | POA: Diagnosis not present

## 2022-09-24 DIAGNOSIS — Z96641 Presence of right artificial hip joint: Secondary | ICD-10-CM | POA: Diagnosis not present

## 2022-09-24 DIAGNOSIS — M810 Age-related osteoporosis without current pathological fracture: Secondary | ICD-10-CM | POA: Diagnosis not present

## 2022-09-24 DIAGNOSIS — Z8781 Personal history of (healed) traumatic fracture: Secondary | ICD-10-CM | POA: Diagnosis not present

## 2022-10-01 DIAGNOSIS — S72002D Fracture of unspecified part of neck of left femur, subsequent encounter for closed fracture with routine healing: Secondary | ICD-10-CM | POA: Diagnosis not present

## 2022-10-04 ENCOUNTER — Ambulatory Visit (HOSPITAL_BASED_OUTPATIENT_CLINIC_OR_DEPARTMENT_OTHER): Payer: Medicare Other | Admitting: Nurse Practitioner

## 2022-10-30 DIAGNOSIS — N8110 Cystocele, unspecified: Secondary | ICD-10-CM | POA: Diagnosis not present

## 2022-10-30 DIAGNOSIS — R197 Diarrhea, unspecified: Secondary | ICD-10-CM | POA: Diagnosis not present

## 2022-10-30 DIAGNOSIS — Z8719 Personal history of other diseases of the digestive system: Secondary | ICD-10-CM | POA: Diagnosis not present

## 2022-10-30 DIAGNOSIS — K644 Residual hemorrhoidal skin tags: Secondary | ICD-10-CM | POA: Diagnosis not present

## 2022-11-06 DIAGNOSIS — N8111 Cystocele, midline: Secondary | ICD-10-CM | POA: Diagnosis not present

## 2022-11-19 ENCOUNTER — Other Ambulatory Visit: Payer: Self-pay

## 2022-11-19 ENCOUNTER — Ambulatory Visit: Payer: Medicare Other | Attending: Family Medicine

## 2022-11-19 DIAGNOSIS — R279 Unspecified lack of coordination: Secondary | ICD-10-CM | POA: Diagnosis not present

## 2022-11-19 DIAGNOSIS — M25552 Pain in left hip: Secondary | ICD-10-CM | POA: Insufficient documentation

## 2022-11-19 DIAGNOSIS — R293 Abnormal posture: Secondary | ICD-10-CM | POA: Insufficient documentation

## 2022-11-19 DIAGNOSIS — M6281 Muscle weakness (generalized): Secondary | ICD-10-CM | POA: Insufficient documentation

## 2022-11-19 NOTE — Therapy (Signed)
OUTPATIENT PHYSICAL THERAPY FEMALE PELVIC EVALUATION   Patient Name: Mackenzie Green MRN: [id number] DOB:[birth year], 76 y.o., female Today's Date: 11/19/2022  END OF SESSION:  PT End of Session - 11/19/22 1719     Visit Number 1    Date for PT Re-Evaluation 01/28/23    Authorization Type [employer] Medicare    PT Start Time 1400    PT Stop Time 8413    PT Time Calculation (min) 45 min    Activity Tolerance Patient tolerated treatment well    Behavior During Therapy WFL for tasks assessed/performed             Past Medical History:  Diagnosis Date   Depression with anxiety    Low iron    Mild cognitive impairment with memory loss    Mitral valve prolapse    Past Surgical History:  Procedure Laterality Date   TOTAL HIP ARTHROPLASTY Left 03/27/2022   Procedure: LEFT POSTERIOR TOTAL HIP ARTHROPLASTY;  Surgeon: Willaim Sheng, MD;  Location: [facility];  Service: Orthopedics;  Laterality: Left;   Patient Active Problem List   Diagnosis Date Noted   Hip fracture ([place]) 03/26/2022   Mild cognitive impairment with memory loss 03/26/2022   Depression with anxiety 03/26/2022   Spinal stenosis at L4-L5 level 03/26/2022   Neutropenia ([place]) 08/27/2016   Weight loss 07/31/2012   Hypokalemia 07/31/2012   Hyponatremia 07/30/2012   Hypoglycemia 07/30/2012    PCP: Aretta Nip, MD  REFERRING PROVIDER: Faustino Congress, NP  REFERRING DIAG: N81.10 (ICD-10-CM) - Cystocele, unspecified R15.9 (ICD-10-CM) - Full incontinence of feces  THERAPY DIAG:  Abnormal posture  Muscle weakness (generalized)  Unspecified lack of coordination  Pain in left hip  Rationale for Evaluation and Treatment: Rehabilitation  ONSET DATE: chronic  SUBJECTIVE:                                                                                                                                                                                           11/19/22 SUBJECTIVE STATEMENT: Pt states that  she has had issues with diarrhea and fecal incontinence; this is better after getting a pessary about a month and a half ago. She was having vaginal bleeding and was told this was from bladder prolapse; she has not had this since pessary was placed. She also had series of UTIs and a yeast infection. She is working on trying to build bone density right now due to fall and Lt hip replacement. She has been doing pelvic floor contractions 4x/day in various positions until MD told her her muscles were too far gone to do any good, therefore she stopped.  She states that she is a little overwhelmed with all of her exercises for bones/muscles. She does also go to the pool. She is active and motivated. She is having consistent yellow/brown discharge since wearing pessary - she has talked with MD about it. Her pessary was placed and will remain in place until her 3 month appointment.  Fluid intake: Yes: not discussed in detail    PAIN:  Are you having pain? Yes NPRS scale: no provided this session Pain location:  Lt hip pain  Pain type: aching Pain description: intermittent   Aggravating factors: walking Relieving factors: rest  PRECAUTIONS: None  WEIGHT BEARING RESTRICTIONS: No  FALLS:  Has patient fallen in last 6 months? 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, spinal stenosis L4-5, osteopenia/osteoporosis Sexual abuse: No  BOWEL MOVEMENT: Pain with bowel movement: No Type of bowel movement:Frequency sometimes multiple times a day, sometimes just once and Strain No Fully empty rectum: Yes: fees empty, but then she still has to go Leakage: No - not in the last several weeks besides occasional leakage - at time of referral she was having bowel movement leakage outside of  control every time she urinated  Pads: Yes: - Fiber supplement: No *uses wash cloth to clean  URINATION: Pain with urination: No Fully empty bladder: Yes: she states that she tries to Stream: Strong Urgency: Yes: does not hold urine well Frequency: 2x/night; every hour - unchanged since she got pessary Leakage: Urge to void and Walking to the bathroom Pads: Yes: goes through several a day, mostly for discharge   INTERCOURSE: Pain with intercourse:  not sexually active currently; history of pain since menopause   PREGNANCY: Vaginal deliveries 2 Tearing Yes: forceps delivery ; significant tearing   PROLAPSE: Cystocele had to press bladder back inside vaginal canal prior to pessary   OBJECTIVE:  11/19/22:  COGNITION: Overall cognitive status: Within functional limits for tasks assessed     SENSATION: Light touch: Appears intact Proprioception: Appears intact  MUSCLE LENGTH:   FUNCTIONAL TESTS:    GAIT: Lt trendelenburg  POSTURE: rounded shoulders, forward head, decreased lumbar lordosis, increased thoracic kyphosis, and posterior pelvic tilt  LUMBARAROM/PROM:   LOWER EXTREMITY MMT:   PALPATION:   General  mild abdominal doming with increased abdominal pressure                External Perineal Exam: palor in tissues, fusion of labia minora with labia majora, bright red urethral tissue, red irritated issue in vestibule                             Internal Pelvic Floor no tenderness, pessary present  Patient confirms identification and approves PT to assess internal pelvic floor and treatment Yes  PELVIC MMT:   MMT eval  Vaginal 2/5, endurance 3 seconds, repeat contractions 4x  Internal Anal Sphincter   External Anal Sphincter   Puborectalis   Diastasis Recti WNL  (Blank rows = not tested)        TONE: low  PROLAPSE: Pessary present, but anterior vaginal wall laxity still noted with bear down, no rectocele appreciated with pessary  TODAY'S  TREATMENT:  DATE: 11/19/22  EVAL  Manual: Internal pelvic floor techniques: No emotional/communication barriers or cognitive limitation. Patient is motivated to learn. Patient understands and agrees with treatment goals and plan. PT explains patient will be examined in standing, sitting, and lying down to see how their muscles and joints work. When they are ready, they will be asked to remove their underwear so PT can examine their perineum. The patient is also given the option of providing their own chaperone as one is not provided in our facility. The patient also has the right and is explained the right to defer or refuse any part of the evaluation or treatment including the internal exam. With the patient's consent, PT will use one gloved finger to gently assess the muscles of the pelvic floor, seeing how well it contracts and relaxes and if there is muscle symmetry. After, the patient will get dressed and PT and patient will discuss exam findings and plan of care. PT and patient discuss plan of care, schedule, attendance policy and HEP activities. Internal vaginal assessment of pelvic floor Neuromuscular re-education: Pelvic floor contraction training: Quick flicks  Long holds    PATIENT EDUCATION:  Education details: education on POC; will try and reach NP that placed pessary and ask about discharge Person educated: Patient Education method: Explanation, Demonstration, Tactile cues, Verbal cues, and Handouts Education comprehension: verbalized understanding  HOME EXERCISE PROGRAM: Access Code: [email or web address]   ASSESSMENT:  CLINICAL IMPRESSION: Patient is a 76 y.o. female who was seen today for physical therapy evaluation and treatment for pelvic floor weakness that has lead to varying amounts of fecal and urinary incontinence in addition to urinary/fecal urgency.  Since having pessary placed, she reports significant improvement in fecal incontinence and large increase in vaginal discharge. Exam findings notable for pelvic floor weakness 2/5, decreased pelvic floor endurance 3 seconds, and ability to perform 4 repeat contractions in 10 seconds; small amount of anterior vaginal wall laxity appreciated with pessary in place. Would like to speak with MD about possibility of increased vaginal discharge being due to leaving pessary in and if patient may benefit from having a pessary that she can remove nightly. Also concerned that she is not using vaginal estrogen internally due to systemic changes with the placement of the pessary; she was encouraged to apply the estrogen cream to vulva in addition to vaginal canal. Pelvic floor weakness, decreased motor control, and poor endurance are impacting bowel and bladder symptoms; believe that Lt hip fracture and surgery is also impacting pelvic floor. She will benefit from skilled PT intervention in order to improve urinary/bowel symptoms, perform pelvic floor strengthening program, and incorporate pelvic floor into functional strengthening with appropriate pressure management.   OBJECTIVE IMPAIRMENTS: Abnormal gait, decreased activity tolerance, decreased coordination, decreased endurance, decreased strength, increased fascial restrictions, increased muscle spasms, impaired tone, postural dysfunction, and pain.   ACTIVITY LIMITATIONS: continence  PARTICIPATION LIMITATIONS: community activity  PERSONAL FACTORS: 3+ comorbidities: Lichens sclerosis, cystocele, hemorrhoids, diarrhea, Lt THA 04/25/22, spinal stenosis L4-5, osteopenia/osteoporosis  are also affecting patient's functional outcome.   REHAB POTENTIAL: Good  CLINICAL DECISION MAKING: Stable/uncomplicated  EVALUATION COMPLEXITY: Low   GOALS: Goals reviewed with patient? Yes  SHORT TERM GOALS: Target date: 12/17/22  Pt will be independent with HEP.    Baseline: Goal status: INITIAL  2.  Pt will be able to correctly perform diaphragmatic breathing and appropriate pressure management in order to prevent worsening vaginal wall laxity and improve pelvic floor A/ROM.   Baseline:  Goal status: INITIAL  3.  Pt will be independent with the knack, urge suppression technique, and double voiding in order to improve bladder habits and decrease urinary/fecal incontinence.   Baseline:  Goal status: INITIAL   LONG TERM GOALS: Target date: 01/28/22  Pt will be independent with advanced HEP.   Baseline:  Goal status: INITIAL  2.  Pt will demonstrate normal pelvic floor muscle tone and A/ROM, able to achieve 3/5 strength with contractions and 10 sec endurance, in order to provide appropriate lumbopelvic support in functional activities.   Baseline:  Goal status: INITIAL  3.  Pt will report improved sensation of control over urgency and continence with bowel movements without any episodes of fecal incontinence.  Baseline:  Goal status: INITIAL  4.  Pt will be able to go 2-3 hours in between voids without urgency or incontinence in order to improve QOL and perform all functional activities with less difficulty.   Baseline:  Goal status: INITIAL   PLAN:  PT FREQUENCY: 1x/week  PT DURATION: 10 weeks  PLANNED INTERVENTIONS: Therapeutic exercises, Therapeutic activity, Neuromuscular re-education, Balance training, Gait training, Patient/Family education, Self Care, Joint mobilization, Dry Needling, Biofeedback, and Manual therapy  PLAN FOR NEXT SESSION: Progress pelvic floor strengthening to standing positions; begin hip/core strengthening; discuss pressure management.    Heather Roberts, PT, DPT11/28/235:35 PM

## 2022-12-09 ENCOUNTER — Ambulatory Visit: Payer: Medicare Other | Attending: Family Medicine

## 2022-12-09 DIAGNOSIS — M25552 Pain in left hip: Secondary | ICD-10-CM | POA: Diagnosis not present

## 2022-12-09 DIAGNOSIS — R279 Unspecified lack of coordination: Secondary | ICD-10-CM | POA: Insufficient documentation

## 2022-12-09 DIAGNOSIS — M6281 Muscle weakness (generalized): Secondary | ICD-10-CM | POA: Insufficient documentation

## 2022-12-09 DIAGNOSIS — R293 Abnormal posture: Secondary | ICD-10-CM | POA: Insufficient documentation

## 2022-12-09 NOTE — Therapy (Signed)
OUTPATIENT PHYSICAL THERAPY TREATMENT NOTE   Patient Name: Mackenzie Green MRN: [id number] DOB:1946/04/26, 76 y.o., female Today's Date: 12/09/2022  PCP: Aretta Nip, MD REFERRING PROVIDER: Faustino Congress, NP  END OF SESSION:   PT End of Session - 12/09/22 1534     Visit Number 2    Date for PT Re-Evaluation 01/28/23    Authorization Type [employer] Medicare    PT Start Time 1534    PT Stop Time 1612    PT Time Calculation (min) 38 min    Activity Tolerance Patient tolerated treatment well    Behavior During Therapy WFL for tasks assessed/performed             Past Medical History:  Diagnosis Date   Depression with anxiety    Low iron    Mild cognitive impairment with memory loss    Mitral valve prolapse    Past Surgical History:  Procedure Laterality Date   TOTAL HIP ARTHROPLASTY Left 03/27/2022   Procedure: LEFT POSTERIOR TOTAL HIP ARTHROPLASTY;  Surgeon: Willaim Sheng, MD;  Location: [facility];  Service: Orthopedics;  Laterality: Left;   Patient Active Problem List   Diagnosis Date Noted   Hip fracture ([place]) 03/26/2022   Mild cognitive impairment with memory loss 03/26/2022   Depression with anxiety 03/26/2022   Spinal stenosis at L4-L5 level 03/26/2022   Neutropenia ([place]) 08/27/2016   Weight loss 07/31/2012   Hypokalemia 07/31/2012   Hyponatremia 07/30/2012   Hypoglycemia 07/30/2012    REFERRING DIAG: N81.10 (ICD-10-CM) - Cystocele, unspecified R15.9 (ICD-10-CM) - Full incontinence of feces  THERAPY DIAG:  Abnormal posture  Muscle weakness (generalized)  Unspecified lack of coordination  Pain in left hip  Rationale for Evaluation and Treatment Rehabilitation  PERTINENT HISTORY: Lichens sclerosis, cystocele, hemorrhoids, diarrhea, Lt THA 04/25/22, spinal stenosis L4-5, osteopenia/osteoporosis  PRECAUTIONS: NA  SUBJECTIVE:                                                                                                                                                                                       SUBJECTIVE STATEMENT:  Pt states that she did not do a whole lot of her exercises. She feels like she is already doing so much maintenance in the pelvic floor area, she doesn't feel like she has time for much else. She states that IBS was improved after being here so she did not have as much difficulty.    PAIN:  Are you having pain? Yes: NPRS scale: 3/10 Pain location: Lt hip Pain description: stiff Aggravating factors: gym? Relieving factors: gym?   11/19/22 SUBJECTIVE STATEMENT: Pt states that she has had issues with diarrhea and  fecal incontinence; this is better after getting a pessary about a month and a half ago. She was having vaginal bleeding and was told this was from bladder prolapse; she has not had this since pessary was placed. She also had series of UTIs and a yeast infection. She is working on trying to build bone density right now due to fall and Lt hip replacement. She has been doing pelvic floor contractions 4x/day in various positions until MD told her her muscles were too far gone to do any good, therefore she stopped. She states that she is a little overwhelmed with all of her exercises for bones/muscles. She does also go to the pool. She is active and motivated. She is having consistent yellow/brown discharge since wearing pessary - she has talked with MD about it. Her pessary was placed and will remain in place until her 3 month appointment.  Fluid intake: Yes: not discussed in detail     PAIN:  Are you having pain? Yes NPRS scale: no provided this session Pain location:  Lt hip pain   Pain type: aching Pain description: intermittent    Aggravating factors: walking Relieving factors: rest   PRECAUTIONS: None   WEIGHT BEARING RESTRICTIONS: No   FALLS:  Has patient fallen in last 6 months? No   LIVING ENVIRONMENT: Lives with: lives with their family Lives in: House/apartment     OCCUPATION:  retired   PLOF: Independent   PATIENT GOALS: to strengthen pelvic floor    PERTINENT HISTORY:  Lichens sclerosis, cystocele, hemorrhoids, diarrhea, Lt THA 04/25/22, spinal stenosis L4-5, osteopenia/osteoporosis Sexual abuse: No   BOWEL MOVEMENT: Pain with bowel movement: No Type of bowel movement:Frequency sometimes multiple times a day, sometimes just once and Strain No Fully empty rectum: Yes: fees empty, but then she still has to go Leakage: No - not in the last several weeks besides occasional leakage - at time of referral she was having bowel movement leakage outside of control every time she urinated  Pads: Yes: - Fiber supplement: No *uses wash cloth to clean   URINATION: Pain with urination: No Fully empty bladder: Yes: she states that she tries to Stream: Strong Urgency: Yes: does not hold urine well Frequency: 2x/night; every hour - unchanged since she got pessary Leakage: Urge to void and Walking to the bathroom Pads: Yes: goes through several a day, mostly for discharge    INTERCOURSE: Pain with intercourse:  not sexually active currently; history of pain since menopause     PREGNANCY: Vaginal deliveries 2 Tearing Yes: forceps delivery ; significant tearing     PROLAPSE: Cystocele had to press bladder back inside vaginal canal prior to pessary     OBJECTIVE:  11/19/22:   COGNITION: Overall cognitive status: Within functional limits for tasks assessed                          SENSATION: Light touch: Appears intact Proprioception: Appears intact   MUSCLE LENGTH:     FUNCTIONAL TESTS:      GAIT: Lt trendelenburg   POSTURE: rounded shoulders, forward head, decreased lumbar lordosis, increased thoracic kyphosis, and posterior pelvic tilt   LUMBARAROM/PROM:     LOWER EXTREMITY MMT:     PALPATION:   General  mild abdominal doming with increased abdominal pressure                 External Perineal Exam: palor in tissues, fusion of labia minora  with labia  majora, bright red urethral tissue, red irritated issue in vestibule                             Internal Pelvic Floor no tenderness, pessary present   Patient confirms identification and approves PT to assess internal pelvic floor and treatment Yes   PELVIC MMT:   MMT eval  Vaginal 2/5, endurance 3 seconds, repeat contractions 4x  Internal Anal Sphincter    External Anal Sphincter    Puborectalis    Diastasis Recti WNL  (Blank rows = not tested)         TONE: low   PROLAPSE: Pessary present, but anterior vaginal wall laxity still noted with bear down, no rectocele appreciated with pessary   TODAY'S TREATMENT 12/09/22:         Neuromuscular re-education: Supine march with pelvic floor and abdomen 2 x 10 Bridge with hip adduction, pelvic floor, and core 10x Exercises: Strengthening 3 way kick Rows green band Single leg rows red band Bil UE extensions red band                                                                                                                         DATE: 11/19/22  EVAL  Manual: Internal pelvic floor techniques: No emotional/communication barriers or cognitive limitation. Patient is motivated to learn. Patient understands and agrees with treatment goals and plan. PT explains patient will be examined in standing, sitting, and lying down to see how their muscles and joints work. When they are ready, they will be asked to remove their underwear so PT can examine their perineum. The patient is also given the option of providing their own chaperone as one is not provided in our facility. The patient also has the right and is explained the right to defer or refuse any part of the evaluation or treatment including the internal exam. With the patient's consent, PT will use one gloved finger to gently assess the muscles of the pelvic floor, seeing how well it contracts and relaxes and if there is muscle symmetry. After, the patient will get dressed  and PT and patient will discuss exam findings and plan of care. PT and patient discuss plan of care, schedule, attendance policy and HEP activities. Internal vaginal assessment of pelvic floor Neuromuscular re-education: Pelvic floor contraction training: Quick flicks  Long holds       PATIENT EDUCATION:  Education details: education on POC; will try and reach NP that placed pessary and ask about discharge Person educated: Patient Education method: Explanation, Demonstration, Tactile cues, Verbal cues, and Handouts Education comprehension: verbalized understanding   HOME EXERCISE PROGRAM: Access Code: [email or web address]     ASSESSMENT:   CLINICAL IMPRESSION: Pt did well with addition of core training in addition to pelvic floor and how to coordinate these contractions. We started discussing how to incorporate pelvic floor/core contractions into any exercise that she is already doing. At this time,  she also needs to be consistently performing simple pelvic floor contractions/long holds. Believe that even though she has been strengthening for the last 9 months, she has not been challenging muscles enough to see appropriate hypertrophy in tissue to support joints/body weight and she will benefit from increasing resistance. We also discussed relationship between pelvic floor and hip muscles and that helping each group will benefit the other. She will continue to benefit from skilled PT intervention in order to improve urinary/bowel symptoms, perform pelvic floor strengthening program, and incorporate pelvic floor into functional strengthening with appropriate pressure management.    OBJECTIVE IMPAIRMENTS: Abnormal gait, decreased activity tolerance, decreased coordination, decreased endurance, decreased strength, increased fascial restrictions, increased muscle spasms, impaired tone, postural dysfunction, and pain.    ACTIVITY LIMITATIONS: continence   PARTICIPATION LIMITATIONS: community activity    PERSONAL FACTORS: 3+ comorbidities: Lichens sclerosis, cystocele, hemorrhoids, diarrhea, Lt THA 04/25/22, spinal stenosis L4-5, osteopenia/osteoporosis  are also affecting patient's functional outcome.    REHAB POTENTIAL: Good   CLINICAL DECISION MAKING: Stable/uncomplicated   EVALUATION COMPLEXITY: Low     GOALS: Goals reviewed with patient? Yes   SHORT TERM GOALS: Target date: 12/17/22   Pt will be independent with HEP.    Baseline: Goal status: INITIAL   2.  Pt will be able to correctly perform diaphragmatic breathing and appropriate pressure management in order to prevent worsening vaginal wall laxity and improve pelvic floor A/ROM.    Baseline:  Goal status: INITIAL   3.  Pt will be independent with the knack, urge suppression technique, and double voiding in order to improve bladder habits and decrease urinary/fecal incontinence.    Baseline:  Goal status: INITIAL     LONG TERM GOALS: Target date: 01/28/22   Pt will be independent with advanced HEP.    Baseline:  Goal status: INITIAL   2.  Pt will demonstrate normal pelvic floor muscle tone and A/ROM, able to achieve 3/5 strength with contractions and 10 sec endurance, in order to provide appropriate lumbopelvic support in functional activities.    Baseline:  Goal status: INITIAL   3.  Pt will report improved sensation of control over urgency and continence with bowel movements without any episodes of fecal incontinence.  Baseline:  Goal status: INITIAL   4.  Pt will be able to go 2-3 hours in between voids without urgency or incontinence in order to improve QOL and perform all functional activities with less difficulty.    Baseline:  Goal status: INITIAL     PLAN:   PT FREQUENCY: 1x/week   PT DURATION: 10 weeks   PLANNED INTERVENTIONS: Therapeutic exercises, Therapeutic activity, Neuromuscular re-education, Balance training, Gait training, Patient/Family education, Self Care, Joint mobilization, Dry  Needling, Biofeedback, and Manual therapy   PLAN FOR NEXT SESSION: Progress pelvic floor strengthening to standing positions; begin hip/core strengthening; discuss pressure management.      Heather Roberts, PT, DPT12/18/234:28 PM

## 2022-12-25 ENCOUNTER — Ambulatory Visit: Payer: Medicare Other | Attending: Family Medicine

## 2022-12-25 DIAGNOSIS — R293 Abnormal posture: Secondary | ICD-10-CM

## 2022-12-25 DIAGNOSIS — M6281 Muscle weakness (generalized): Secondary | ICD-10-CM | POA: Diagnosis not present

## 2022-12-25 DIAGNOSIS — M25552 Pain in left hip: Secondary | ICD-10-CM

## 2022-12-25 DIAGNOSIS — R279 Unspecified lack of coordination: Secondary | ICD-10-CM

## 2022-12-25 DIAGNOSIS — N8111 Cystocele, midline: Secondary | ICD-10-CM | POA: Diagnosis not present

## 2022-12-25 NOTE — Patient Instructions (Addendum)
Bladder/bowel retraining:  8-12 grams of fiber with each meal After each meal, go attempt to have a bowel movement within 5 minutes and don't sit on the toilet for more than 5 minutes   For two weeks.'   Bowel massage: To assist with more regular and more comfortable bowel movements, try performing bowel massage 1-2 times a day for 5-10 minutes. Place hands in the lower right side of your abdomen to start; in small circles, massage up, across, and down the left side of your abdomen. Pressure does not need to be hard, but just comfortable. You can use lotion or oil to make more comfortable.      [facility] [address], [place], [place] [address] Phone # [phone] Fax [phone]

## 2022-12-25 NOTE — Therapy (Signed)
OUTPATIENT PHYSICAL THERAPY TREATMENT NOTE   Patient Name: Mackenzie Green MRN: [id number] DOB:03/19/1946, 77 y.o., female Today's Date: 12/25/2022  PCP: Aretta Nip, MD REFERRING PROVIDER: Faustino Congress, NP  END OF SESSION:   PT End of Session - 12/25/22 0849     Visit Number 3    Date for PT Re-Evaluation 01/28/23    Authorization Type [employer]    PT Start Time 0848    PT Stop Time 0932    PT Time Calculation (min) 44 min    Activity Tolerance Patient tolerated treatment well    Behavior During Therapy WFL for tasks assessed/performed              Past Medical History:  Diagnosis Date   Depression with anxiety    Low iron    Mild cognitive impairment with memory loss    Mitral valve prolapse    Past Surgical History:  Procedure Laterality Date   TOTAL HIP ARTHROPLASTY Left 03/27/2022   Procedure: LEFT POSTERIOR TOTAL HIP ARTHROPLASTY;  Surgeon: Willaim Sheng, MD;  Location: [facility];  Service: Orthopedics;  Laterality: Left;   Patient Active Problem List   Diagnosis Date Noted   Hip fracture ([place]) 03/26/2022   Mild cognitive impairment with memory loss 03/26/2022   Depression with anxiety 03/26/2022   Spinal stenosis at L4-L5 level 03/26/2022   Neutropenia ([place]) 08/27/2016   Weight loss 07/31/2012   Hypokalemia 07/31/2012   Hyponatremia 07/30/2012   Hypoglycemia 07/30/2012    REFERRING DIAG: N81.10 (ICD-10-CM) - Cystocele, unspecified R15.9 (ICD-10-CM) - Full incontinence of feces  THERAPY DIAG:  Abnormal posture  Muscle weakness (generalized)  Unspecified lack of coordination  Pain in left hip  Rationale for Evaluation and Treatment Rehabilitation  PERTINENT HISTORY: Lichens sclerosis, cystocele, hemorrhoids, diarrhea, 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 She is still having a lot of discharge on pad. She has been performing some exercises, but her band broke. She states that the fecal incontinence comes and goes and feels related to having a bowel movement/gas.    PAIN:  Are you having pain? Yes: NPRS scale: 3/10 Pain location: Lt hip Pain description: stiff Aggravating factors: gym? Relieving factors: gym?   11/19/22 SUBJECTIVE STATEMENT: Pt states that she has had issues with diarrhea and fecal incontinence;  this is better after getting a pessary about a month and a half ago. She was having vaginal bleeding and was told this was from bladder prolapse; she has not had this since pessary was placed. She also had series of UTIs and a yeast infection. She is working on trying to build bone density right now due to fall and Lt hip replacement. She has been doing pelvic floor contractions 4x/day in various positions until MD told her her muscles were too far gone to do any good, therefore she stopped. She states that she is a little overwhelmed with all of her exercises for bones/muscles. She does also go to the pool. She is active and motivated. She is having consistent yellow/brown discharge since wearing pessary - she has talked with MD about it. Her pessary was placed and will remain in place until her 3 month appointment.  Fluid intake: Yes: not discussed in detail     PAIN:  Are you having pain? Yes NPRS scale: no provided this session Pain location:  Lt hip pain   Pain type: aching Pain description: intermittent    Aggravating factors: walking Relieving factors: rest   PRECAUTIONS: None   WEIGHT BEARING RESTRICTIONS: No   FALLS:  Has patient fallen in last 6 months? No   LIVING ENVIRONMENT: Lives with: lives with their family Lives in: House/apartment      OCCUPATION: retired   PLOF: Independent   PATIENT GOALS: to strengthen pelvic floor    PERTINENT HISTORY:  Lichens sclerosis, cystocele, hemorrhoids, diarrhea, Lt THA 04/25/22, spinal stenosis L4-5, osteopenia/osteoporosis Sexual abuse: No   BOWEL MOVEMENT: Pain with bowel movement: No Type of bowel movement:Frequency sometimes multiple times a day, sometimes just once and Strain No Fully empty rectum: Yes: fees empty, but then she still has to go Leakage: No - not in the last several weeks besides occasional leakage - at time of referral she was having bowel movement leakage outside of control every time she urinated  Pads: Yes: - Fiber supplement: No *uses wash cloth to clean   URINATION: Pain with urination: No Fully empty bladder: Yes: she states that she tries to Stream: Strong Urgency: Yes: does not hold urine well Frequency: 2x/night; every hour - unchanged since she got pessary Leakage: Urge to void and Walking to the bathroom Pads: Yes: goes through several a day, mostly for discharge    INTERCOURSE: Pain with intercourse:  not sexually active currently; history of pain since menopause     PREGNANCY: Vaginal deliveries 2 Tearing Yes: forceps delivery ; significant tearing     PROLAPSE: Cystocele had to press bladder back inside vaginal canal prior to pessary     OBJECTIVE:  11/19/22:   COGNITION: Overall cognitive status: Within functional limits for tasks assessed                          SENSATION: Light touch: Appears intact Proprioception: Appears intact   MUSCLE LENGTH:     FUNCTIONAL TESTS:      GAIT: Lt trendelenburg   POSTURE: rounded shoulders, forward head, decreased lumbar lordosis, increased thoracic kyphosis, and posterior pelvic tilt   LUMBARAROM/PROM:     LOWER EXTREMITY MMT:     PALPATION:   General  mild abdominal doming with increased abdominal pressure                 External Perineal Exam: palor in tissues, fusion  of labia minora with labia majora, bright  red urethral tissue, red irritated issue in vestibule                             Internal Pelvic Floor no tenderness, pessary present   Patient confirms identification and approves PT to assess internal pelvic floor and treatment Yes   PELVIC MMT:   MMT eval  Vaginal 2/5, endurance 3 seconds, repeat contractions 4x  Internal Anal Sphincter    External Anal Sphincter    Puborectalis    Diastasis Recti WNL  (Blank rows = not tested)         TONE: low   PROLAPSE: Pessary present, but anterior vaginal wall laxity still noted with bear down, no rectocele appreciated with pessary   TODAY'S TREATMENT 12/25/22: Manual: Trigger Point Dry-Needling  Treatment instructions: Expect mild to moderate muscle soreness. S/S of pneumothorax if dry needled over a lung field, and to seek immediate medical attention should they occur. Patient verbalized understanding of these instructions and education.  Patient Consent Given: Yes Education handout provided: Yes Muscles treated: Lt TFL and glutes Electrical stimulation performed: No Parameters: N/A Treatment response/outcome: twitch response and improved tension  Soft tissue mobilization Lt hip Negative pressure soft tissue mobilization with cup Lt hip Therapeutic activities: Discussed HEP and spreading out exercises/varying them each day Bowel retraining Bowel massage Urge suppression technique to help achieve bowel retraining    TREATMENT 12/09/22:         Neuromuscular re-education: Supine march with pelvic floor and abdomen 2 x 10 Bridge with hip adduction, pelvic floor, and core 10x Exercises: Strengthening 3 way kick Rows green band Single leg rows red band Bil UE extensions red band Therapeutic Activities: Discussed HEP and spreading out exercises/varying them each day                                                                                                                          DATE: 11/19/22  EVAL  Manual: Internal pelvic floor techniques: No emotional/communication barriers or cognitive limitation. Patient is motivated to learn. Patient understands and agrees with treatment goals and plan. PT explains patient will be examined in standing, sitting, and lying down to see how their muscles and joints work. When they are ready, they will be asked to remove their underwear so PT can examine their perineum. The patient is also given the option of providing their own chaperone as one is not provided in our facility. The patient also has the right and is explained the right to defer or refuse any part of the evaluation or treatment including the internal exam. With the patient's consent, PT will use one gloved finger to gently assess the muscles of the pelvic floor, seeing how well it contracts and relaxes and if there is muscle symmetry. After, the patient will get dressed and PT and patient will discuss exam findings and plan of care. PT and patient discuss plan of care, schedule, attendance  policy and HEP activities. Internal vaginal assessment of pelvic floor Neuromuscular re-education: Pelvic floor contraction training: Quick flicks  Long holds       PATIENT EDUCATION:  Education details: see above Person educated: Patient Education method: Explanation, Demonstration, Tactile cues, Verbal cues, and Handouts Education comprehension: verbalized understanding   HOME EXERCISE PROGRAM: Access Code: [email or web address]     ASSESSMENT:   CLINICAL IMPRESSION: Believe that bowel retraining will be very helpful in decreasing frequency of bowel movements, improving fecal continence. She is agreeable to bowel retraining programand incorporating bowel massage into daily routine. We also discussed not performing every exercise every day, but adding variety each day and splitting up exercises. We continued to discuss relationship between hip surgery, scar tissue, pelvic floor, and  fecal incontinence. Due to this, manual techniques performed to Lt hip. She demosntrates significant restriction and scar tissue with familiar pain. Large improvement in discomfort and restriction with manual techniques this session. She will continue to benefit from skilled PT intervention in order to improve urinary/bowel symptoms, perform pelvic floor strengthening program, and incorporate pelvic floor into functional strengthening with appropriate pressure management.    OBJECTIVE IMPAIRMENTS: Abnormal gait, decreased activity tolerance, decreased coordination, decreased endurance, decreased strength, increased fascial restrictions, increased muscle spasms, impaired tone, postural dysfunction, and pain.    ACTIVITY LIMITATIONS: continence   PARTICIPATION LIMITATIONS: community activity   PERSONAL FACTORS: 3+ comorbidities: Lichens sclerosis, cystocele, hemorrhoids, diarrhea, Lt THA 04/25/22, spinal stenosis L4-5, osteopenia/osteoporosis  are also affecting patient's functional outcome.    REHAB POTENTIAL: Good   CLINICAL DECISION MAKING: Stable/uncomplicated   EVALUATION COMPLEXITY: Low     GOALS: Goals reviewed with patient? Yes   SHORT TERM GOALS: Target date: 12/17/22 - updated 12/25/22   Pt will be independent with HEP.    Baseline: Goal status: IN PROGRESS   2.  Pt will be able to correctly perform diaphragmatic breathing and appropriate pressure management in order to prevent worsening vaginal wall laxity and improve pelvic floor A/ROM.    Baseline:  Goal status: IN PROGRESS   3.  Pt will be independent with the knack, urge suppression technique, and double voiding in order to improve bladder habits and decrease urinary/fecal incontinence.    Baseline:  Goal status: IN PROGRESS     LONG TERM GOALS: Target date: 01/28/22 - updated 12/25/22   Pt will be independent with advanced HEP.    Baseline:  Goal status: IN PROGRESS   2.  Pt will demonstrate normal pelvic floor  muscle tone and A/ROM, able to achieve 3/5 strength with contractions and 10 sec endurance, in order to provide appropriate lumbopelvic support in functional activities.    Baseline:  Goal status: IN PROGRESS   3.  Pt will report improved sensation of control over urgency and continence with bowel movements without any episodes of fecal incontinence.  Baseline:  Goal status: IIN PROGRESS   4.  Pt will be able to go 2-3 hours in between voids without urgency or incontinence in order to improve QOL and perform all functional activities with less difficulty.    Baseline:  Goal status: IN PROGRESS     PLAN:   PT FREQUENCY: 1x/week   PT DURATION: 10 weeks   PLANNED INTERVENTIONS: Therapeutic exercises, Therapeutic activity, Neuromuscular re-education, Balance training, Gait training, Patient/Family education, Self Care, Joint mobilization, Dry Needling, Biofeedback, and Manual therapy   PLAN FOR NEXT SESSION: Discuss how bowel retraining is going and utilizing urge suppression technique; continue manual techniques to Lt hip;  progress standing strengthening to include pallof press, lunges, lateral step down, lunges with rotation, squat press; supine single chest press with bridge.      Heather Roberts, PT, DPT01/02/2409:31 AM

## 2022-12-30 ENCOUNTER — Ambulatory Visit: Payer: Medicare Other

## 2022-12-30 DIAGNOSIS — R293 Abnormal posture: Secondary | ICD-10-CM | POA: Diagnosis not present

## 2022-12-30 DIAGNOSIS — R279 Unspecified lack of coordination: Secondary | ICD-10-CM | POA: Diagnosis not present

## 2022-12-30 DIAGNOSIS — M25552 Pain in left hip: Secondary | ICD-10-CM

## 2022-12-30 DIAGNOSIS — M6281 Muscle weakness (generalized): Secondary | ICD-10-CM

## 2022-12-30 NOTE — Therapy (Signed)
OUTPATIENT PHYSICAL THERAPY TREATMENT NOTE   Patient Name: Mackenzie Green MRN: [id number] DOB:02/09/1946, 77 y.o., female Today's Date: 12/30/2022  PCP: Aretta Nip, MD REFERRING PROVIDER: Faustino Congress, NP  END OF SESSION:   PT End of Session - 12/30/22 1445     Visit Number 4    Date for PT Re-Evaluation 01/28/23    Authorization Type [employer]    PT Start Time 0630    PT Stop Time 1525    PT Time Calculation (min) 40 min    Activity Tolerance Patient tolerated treatment well    Behavior During Therapy WFL for tasks assessed/performed               Past Medical History:  Diagnosis Date   Depression with anxiety    Low iron    Mild cognitive impairment with memory loss    Mitral valve prolapse    Past Surgical History:  Procedure Laterality Date   TOTAL HIP ARTHROPLASTY Left 03/27/2022   Procedure: LEFT POSTERIOR TOTAL HIP ARTHROPLASTY;  Surgeon: Willaim Sheng, MD;  Location: [facility];  Service: Orthopedics;  Laterality: Left;   Patient Active Problem List   Diagnosis Date Noted   Hip fracture ([place]) 03/26/2022   Mild cognitive impairment with memory loss 03/26/2022   Depression with anxiety 03/26/2022   Spinal stenosis at L4-L5 level 03/26/2022   Neutropenia ([place]) 08/27/2016   Weight loss 07/31/2012   Hypokalemia 07/31/2012   Hyponatremia 07/30/2012   Hypoglycemia 07/30/2012    REFERRING DIAG: N81.10 (ICD-10-CM) - Cystocele, unspecified R15.9 (ICD-10-CM) - Full incontinence of feces  THERAPY DIAG:  Abnormal posture  Muscle weakness (generalized)  Unspecified lack of coordination  Pain in left hip  Rationale for Evaluation and Treatment Rehabilitation  PERTINENT HISTORY: Lichens sclerosis, cystocele, hemorrhoids, diarrhea, Lt THA 04/25/22, spinal stenosis L4-5, osteopenia/osteoporosis  PRECAUTIONS: NA  SUBJECTIVE:                                                                                                                                                                                       SUBJECTIVE STATEMENT:  Pt states that the pain and bowling ball sensation in Lt hip is gone after manual techniques last session. She reports that bowel retraining is going very well and she is having more regular bowel movements. Fecal incontinence is still happening, but she states that it is improving. Pessary was dislodged when she saw medical provider last week. Ever since provider replaced the pessary, discharge has ben largely improved. The pessary also gave her a lesion. She states that now that she has a different pessary the vaginal bleeding has stopped.  PAIN:  Are you having pain? Yes: NPRS scale:  /10 Pain location: Lt hip Pain description: stiff Aggravating factors: gym? Relieving factors: gym?   11/19/22 SUBJECTIVE STATEMENT: Pt states that she has had issues with diarrhea and fecal incontinence; this is better after getting a pessary about a month and a half ago. She was having vaginal bleeding and was told this was from bladder prolapse; she has not had this since pessary was placed. She also had series of UTIs and a yeast infection. She is working on trying to build bone density right now due to fall and Lt hip replacement. She has been doing pelvic floor contractions 4x/day in various positions until MD told her her muscles were too far gone to do any good, therefore she stopped. She states that she is a little overwhelmed with all of her exercises for bones/muscles. She does also go to the pool. She is active and motivated. She is having consistent yellow/brown discharge since wearing pessary - she has talked with MD about it. Her pessary was placed and will remain in place until her 3 month appointment.  Fluid intake: Yes: not discussed in detail     PAIN:  Are you having pain? Yes NPRS scale: no provided this session Pain location:  Lt hip pain   Pain type: aching Pain description: intermittent     Aggravating factors: walking Relieving factors: rest   PRECAUTIONS: None   WEIGHT BEARING RESTRICTIONS: No   FALLS:  Has patient fallen in last 6 months? 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/25/22, spinal stenosis L4-5, osteopenia/osteoporosis Sexual abuse: No   BOWEL MOVEMENT: Pain with bowel movement: No Type of bowel movement:Frequency sometimes multiple times a day, sometimes just once and Strain No Fully empty rectum: Yes: fees empty, but then she still has to go Leakage: No - not in the last several weeks besides occasional leakage - at time of referral she was having bowel movement leakage outside of control every time she urinated  Pads: Yes: - Fiber supplement: No *uses wash cloth to clean   URINATION: Pain with urination: No Fully empty bladder: Yes: she states that she tries to Stream: Strong Urgency: Yes: does not hold urine well Frequency: 2x/night; every hour - unchanged since she got pessary Leakage: Urge to void and Walking to the bathroom Pads: Yes: goes through several a day, mostly for discharge    INTERCOURSE: Pain with intercourse:  not sexually active currently; history of pain since menopause     PREGNANCY: Vaginal deliveries 2 Tearing Yes: forceps delivery ; significant tearing     PROLAPSE: Cystocele had to press bladder back inside vaginal canal prior to pessary     OBJECTIVE:  11/19/22:   COGNITION: Overall cognitive status: Within functional limits for tasks assessed                          SENSATION: Light touch: Appears intact Proprioception: Appears intact   MUSCLE LENGTH:     FUNCTIONAL TESTS:      GAIT: Lt trendelenburg   POSTURE: rounded shoulders, forward head, decreased lumbar lordosis,  increased thoracic kyphosis, and posterior pelvic tilt   LUMBARAROM/PROM:     LOWER EXTREMITY MMT:     PALPATION:  General  mild abdominal doming with increased abdominal pressure                 External Perineal Exam: palor in tissues, fusion of labia minora with labia majora, bright red urethral tissue, red irritated issue in vestibule                             Internal Pelvic Floor no tenderness, pessary present   Patient confirms identification and approves PT to assess internal pelvic floor and treatment Yes   PELVIC MMT:   MMT eval  Vaginal 2/5, endurance 3 seconds, repeat contractions 4x  Internal Anal Sphincter    External Anal Sphincter    Puborectalis    Diastasis Recti WNL  (Blank rows = not tested)         TONE: low   PROLAPSE: Pessary present, but anterior vaginal wall laxity still noted with bear down, no rectocele appreciated with pessary   TODAY'S TREATMENT 12/30/22 Manual: Manual: Trigger Point Dry-Needling  Treatment instructions: Expect mild to moderate muscle soreness. S/S of pneumothorax if dry needled over a lung field, and to seek immediate medical attention should they occur. Patient verbalized understanding of these instructions and education.  Patient Consent Given: Yes Education handout provided: Yes Muscles treated: Lt TFL and glutes Electrical stimulation performed: No Parameters: N/A Treatment response/outcome: twitch response and improved tension  Soft tissue mobilization Lt hip Negative pressure soft tissue mobilization with cup Lt hip Neuromuscular re-education: Single leg bridge with bil UE press 5x bil Pallof press 10x bil Bil UE extension 2 x 10    TREATMENT 12/25/22: Manual: Trigger Point Dry-Needling  Treatment instructions: Expect mild to moderate muscle soreness. S/S of pneumothorax if dry needled over a lung field, and to seek immediate medical attention should they occur. Patient verbalized understanding of these  instructions and education.  Patient Consent Given: Yes Education handout provided: Yes Muscles treated: Lt TFL and glutes Electrical stimulation performed: No Parameters: N/A Treatment response/outcome: twitch response and improved tension  Soft tissue mobilization Lt hip Negative pressure soft tissue mobilization with cup Lt hip Therapeutic activities: Discussed HEP and spreading out exercises/varying them each day Bowel retraining Bowel massage Urge suppression technique to help achieve bowel retraining    TREATMENT 12/09/22:         Neuromuscular re-education: Supine march with pelvic floor and abdomen 2 x 10 Bridge with hip adduction, pelvic floor, and core 10x Exercises: Strengthening 3 way kick Rows green band Single leg rows red band Bil UE extensions red band Therapeutic Activities: Discussed HEP and spreading out exercises/varying them each day PATIENT EDUCATION:  Education details: see above Person educated: Patient Education method: Explanation, Demonstration, Tactile cues, Verbal cues, and Handouts Education comprehension: verbalized understanding   HOME EXERCISE PROGRAM: Access Code: [email or web address]     ASSESSMENT:   CLINICAL IMPRESSION: Pt is doing very well. Bowel retraining already producing more normal bowel movements and she feels like she is having less episodes of fecal incontinence. She has had significant reduction in discharge since pessary was placed correctly. Beneficial manual techniques repeated today with further reduction in soft tissue restriction. She was able to progress core/hip exercises without increase in pain. She will continue to benefit from skilled PT intervention in order to improve urinary/bowel symptoms, perform pelvic floor strengthening program, and incorporate pelvic floor into functional strengthening with appropriate pressure management.    OBJECTIVE IMPAIRMENTS: Abnormal gait, decreased activity tolerance, decreased  coordination, decreased endurance, decreased strength, increased fascial restrictions, increased muscle spasms, impaired tone, postural dysfunction, and pain.    ACTIVITY LIMITATIONS: continence   PARTICIPATION LIMITATIONS: community activity   PERSONAL FACTORS: 3+ comorbidities: Lichens sclerosis, cystocele, hemorrhoids, diarrhea, Lt THA 04/25/22, spinal stenosis L4-5, osteopenia/osteoporosis  are also affecting patient's functional outcome.    REHAB POTENTIAL: Good   CLINICAL DECISION MAKING: Stable/uncomplicated   EVALUATION COMPLEXITY: Low     GOALS: Goals reviewed with patient? Yes   SHORT TERM GOALS: Target date: 12/17/22 - updated 12/25/22   Pt will be independent with HEP.    Baseline: Goal status: IN PROGRESS   2.  Pt will be able to correctly perform diaphragmatic breathing and appropriate pressure management in order to prevent worsening vaginal wall laxity and improve pelvic floor A/ROM.    Baseline:  Goal status: IN PROGRESS   3.  Pt will be independent with the knack, urge suppression technique, and double voiding in order to improve bladder habits and decrease urinary/fecal incontinence.    Baseline:  Goal status: IN PROGRESS     LONG TERM GOALS: Target date: 01/28/22 - updated 12/25/22   Pt will be independent with advanced HEP.    Baseline:  Goal status: IN PROGRESS   2.  Pt will demonstrate normal pelvic floor muscle tone and A/ROM, able to achieve 3/5 strength with contractions and 10 sec endurance, in order to provide appropriate lumbopelvic support in functional activities.    Baseline:  Goal status: IN PROGRESS   3.  Pt will report improved sensation of control over urgency and continence with bowel movements without any episodes of fecal incontinence.  Baseline:  Goal status: IIN PROGRESS   4.  Pt will be able to go 2-3 hours in between voids without urgency or incontinence in order to improve QOL and perform all functional activities with less  difficulty.    Baseline:  Goal status: IN PROGRESS     PLAN:   PT FREQUENCY: 1x/week   PT DURATION: 10 weeks   PLANNED INTERVENTIONS: Therapeutic exercises, Therapeutic activity, Neuromuscular re-education, Balance training, Gait training, Patient/Family education, Self Care, Joint mobilization, Dry Needling, Biofeedback, and Manual therapy   PLAN FOR NEXT SESSION: Discuss how bowel retraining is going and utilizing urge suppression technique; continue manual techniques to Lt hip; progress standing strengthening to include lunges, lateral step down, lunges with rotation, squat press.     Heather Roberts, PT, DPT01/08/243:29 PM

## 2022-12-31 ENCOUNTER — Other Ambulatory Visit: Payer: Medicare Other

## 2023-01-03 DIAGNOSIS — N8111 Cystocele, midline: Secondary | ICD-10-CM | POA: Diagnosis not present

## 2023-01-07 ENCOUNTER — Ambulatory Visit: Payer: Medicare Other

## 2023-01-07 DIAGNOSIS — R279 Unspecified lack of coordination: Secondary | ICD-10-CM

## 2023-01-07 DIAGNOSIS — M6281 Muscle weakness (generalized): Secondary | ICD-10-CM

## 2023-01-07 DIAGNOSIS — M25552 Pain in left hip: Secondary | ICD-10-CM

## 2023-01-07 DIAGNOSIS — R293 Abnormal posture: Secondary | ICD-10-CM | POA: Diagnosis not present

## 2023-01-07 NOTE — Therapy (Signed)
OUTPATIENT PHYSICAL THERAPY TREATMENT NOTE   Patient Name: Mackenzie Green MRN: [id number] DOB:12-05-1946, 77 y.o., female Today's Date: 01/07/2023  PCP: Aretta Nip, MD REFERRING PROVIDER: Faustino Congress, NP  END OF SESSION:   PT End of Session - 01/07/23 1540     Visit Number 5    Date for PT Re-Evaluation 01/28/23    Authorization Type [employer]    PT Start Time 1530    PT Stop Time 1610    PT Time Calculation (min) 40 min    Activity Tolerance Patient tolerated treatment well    Behavior During Therapy WFL for tasks assessed/performed                Past Medical History:  Diagnosis Date   Depression with anxiety    Low iron    Mild cognitive impairment with memory loss    Mitral valve prolapse    Past Surgical History:  Procedure Laterality Date   TOTAL HIP ARTHROPLASTY Left 03/27/2022   Procedure: LEFT POSTERIOR TOTAL HIP ARTHROPLASTY;  Surgeon: Willaim Sheng, MD;  [facility];  Service: Orthopedics;  Laterality: Left;   Patient Active Problem List   Diagnosis Date Noted   Hip fracture ([place]) 03/26/2022   Mild cognitive impairment with memory loss 03/26/2022   Depression with anxiety 03/26/2022   Spinal stenosis at L4-L5 level 03/26/2022   Neutropenia ([place]) 08/27/2016   Weight loss 07/31/2012   Hypokalemia 07/31/2012   Hyponatremia 07/30/2012   Hypoglycemia 07/30/2012    REFERRING DIAG: N81.10 (ICD-10-CM) - Cystocele, unspecified R15.9 (ICD-10-CM) - Full incontinence of feces  THERAPY DIAG:  Abnormal posture  Muscle weakness (generalized)  Unspecified lack of coordination  Pain in left hip  Rationale for Evaluation and Treatment Rehabilitation  PERTINENT HISTORY: Lichens sclerosis, cystocele, hemorrhoids, diarrhea, Lt THA 04/25/22, spinal stenosis L4-5, osteopenia/osteoporosis  PRECAUTIONS: NA  SUBJECTIVE:                                                                                                                                                                                       SUBJECTIVE STATEMENT:  Pt states that her Lt hip and LE is doing very well. However, her pessary was dislodged again and lesion in vagina started bleeding again. She had no improvements with fecal incontinence all last week. She feels like she is having an incomplete bowel movement and then it dribbles out throughout the day.    PAIN:  Are you having pain? Yes: NPRS scale:  /10 Pain location: Lt hip Pain description: stiff Aggravating factors: gym? Relieving factors: gym?   11/19/22 SUBJECTIVE STATEMENT: Pt states that she has had issues with  diarrhea and fecal incontinence; this is better after getting a pessary about a month and a half ago. She was having vaginal bleeding and was told this was from bladder prolapse; she has not had this since pessary was placed. She also had series of UTIs and a yeast infection. She is working on trying to build bone density right now due to fall and Lt hip replacement. She has been doing pelvic floor contractions 4x/day in various positions until MD told her her muscles were too far gone to do any good, therefore she stopped. She states that she is a little overwhelmed with all of her exercises for bones/muscles. She does also go to the pool. She is active and motivated. She is having consistent yellow/brown discharge since wearing pessary - she has talked with MD about it. Her pessary was placed and will remain in place until her 3 month appointment.  Fluid intake: Yes: not discussed in detail     PAIN:  Are you having pain? Yes NPRS scale: no provided this session Pain location:  Lt hip pain   Pain type: aching Pain description: intermittent    Aggravating factors: walking Relieving factors: rest   PRECAUTIONS: None   WEIGHT BEARING RESTRICTIONS: No   FALLS:  Has patient fallen in last 6 months? No   LIVING ENVIRONMENT: Lives with: lives with their family Lives in:  House/apartment     OCCUPATION: retired   PLOF: Independent   PATIENT GOALS: to strengthen pelvic floor    PERTINENT HISTORY:  Lichens sclerosis, cystocele, hemorrhoids, diarrhea, Lt THA 04/25/22, spinal stenosis L4-5, osteopenia/osteoporosis Sexual abuse: No   BOWEL MOVEMENT: Pain with bowel movement: No Type of bowel movement:Frequency sometimes multiple times a day, sometimes just once and Strain No Fully empty rectum: Yes: fees empty, but then she still has to go Leakage: No - not in the last several weeks besides occasional leakage - at time of referral she was having bowel movement leakage outside of control every time she urinated  Pads: Yes: - Fiber supplement: No *uses wash cloth to clean   URINATION: Pain with urination: No Fully empty bladder: Yes: she states that she tries to Stream: Strong Urgency: Yes: does not hold urine well Frequency: 2x/night; every hour - unchanged since she got pessary Leakage: Urge to void and Walking to the bathroom Pads: Yes: goes through several a day, mostly for discharge    INTERCOURSE: Pain with intercourse:  not sexually active currently; history of pain since menopause     PREGNANCY: Vaginal deliveries 2 Tearing Yes: forceps delivery ; significant tearing     PROLAPSE: Cystocele had to press bladder back inside vaginal canal prior to pessary     OBJECTIVE:  01/07/23: Grade 3 anterior vaginal wall laxity without pessary Pelvic floor strength 2/5, external anal sphincter 2/5 Difficulty with appropriate bearing down/bulge - able to perform balloon breathing with cuing  11/19/22:  COGNITION: Overall cognitive status: Within functional limits for tasks assessed                          SENSATION: Light touch: Appears intact Proprioception: Appears intact   MUSCLE LENGTH:     FUNCTIONAL TESTS:      GAIT: Lt trendelenburg   POSTURE: rounded shoulders, forward head, decreased lumbar lordosis, increased thoracic  kyphosis, and posterior pelvic tilt   LUMBARAROM/PROM:     LOWER EXTREMITY MMT:     PALPATION:   General  mild abdominal doming  with increased abdominal pressure                 External Perineal Exam: palor in tissues, fusion of labia minora with labia majora, bright red urethral tissue, red irritated issue in vestibule                             Internal Pelvic Floor no tenderness, pessary present   Patient confirms identification and approves PT to assess internal pelvic floor and treatment Yes   PELVIC MMT:   MMT eval  Vaginal 2/5, endurance 3 seconds, repeat contractions 4x  Internal Anal Sphincter    External Anal Sphincter    Puborectalis    Diastasis Recti WNL  (Blank rows = not tested)         TONE: low   PROLAPSE: Pessary present, but anterior vaginal wall laxity still noted with bear down, no rectocele appreciated with pessary   TODAY'S TREATMENT 01/07/23 Manual: Bowel massage Rectal pelvic floor exam Pt provides verbal consent for internal vaginal/rectal pelvic floor exam.  Therapeutic activities: Addition of fiber to diet Coconut oil to perineum to help with dryness of bulge Inverted lying Continuing exercise program as long as she is in control of abdominal pressure Self-bowel massage Squatty potty/relaxed toileting mechanics    TREATMENT 12/30/22 Manual: Manual: Trigger Point Dry-Needling  Treatment instructions: Expect mild to moderate muscle soreness. S/S of pneumothorax if dry needled over a lung field, and to seek immediate medical attention should they occur. Patient verbalized understanding of these instructions and education.  Patient Consent Given: Yes Education handout provided: Yes Muscles treated: Lt TFL and glutes Electrical stimulation performed: No Parameters: N/A Treatment response/outcome: twitch response and improved tension  Soft tissue mobilization Lt hip Negative pressure soft tissue mobilization with cup Lt  hip Neuromuscular re-education: Single leg bridge with bil UE press 5x bil Pallof press 10x bil Bil UE extension 2 x 10    TREATMENT 12/25/22: Manual: Trigger Point Dry-Needling  Treatment instructions: Expect mild to moderate muscle soreness. S/S of pneumothorax if dry needled over a lung field, and to seek immediate medical attention should they occur. Patient verbalized understanding of these instructions and education.  Patient Consent Given: Yes Education handout provided: Yes Muscles treated: Lt TFL and glutes Electrical stimulation performed: No Parameters: N/A Treatment response/outcome: twitch response and improved tension  Soft tissue mobilization Lt hip Negative pressure soft tissue mobilization with cup Lt hip Therapeutic activities: Discussed HEP and spreading out exercises/varying them each day Bowel retraining Bowel massage Urge suppression technique to help achieve bowel retraining     PATIENT EDUCATION:  Education details: see above Person educated: Patient Education method: Consulting civil engineer, Demonstration, Tactile cues, Verbal cues, and Handouts Education comprehension: verbalized understanding   HOME EXERCISE PROGRAM: Access Code: [email or web address]     ASSESSMENT:   CLINICAL IMPRESSION: Pt has had a difficult weak due to pessary being dislodged and laceration returning; she is not currently wearing pessary because of this. We did perform rectal pelvic floor assessment today and she continues to have 2/5 weakness and difficulty with coordination of appropriate contract/relax/bulge. She did well with training on balloon breathing. She was encouraged to continue working on all strengthening even though she can feel the bladder bulge, just be aware of good pressure management techniques using exhale with effort. She was instructed to moisturize vaginal wall that has descended with coconut oil and perform inverted lying at the end of the day with  pelvic floor contractions to  help reduce bulge. She will continue to benefit from skilled PT intervention in order to improve urinary/bowel symptoms, perform pelvic floor strengthening program, and incorporate pelvic floor into functional strengthening with appropriate pressure management.    OBJECTIVE IMPAIRMENTS: Abnormal gait, decreased activity tolerance, decreased coordination, decreased endurance, decreased strength, increased fascial restrictions, increased muscle spasms, impaired tone, postural dysfunction, and pain.    ACTIVITY LIMITATIONS: continence   PARTICIPATION LIMITATIONS: community activity   PERSONAL FACTORS: 3+ comorbidities: Lichens sclerosis, cystocele, hemorrhoids, diarrhea, Lt THA 04/25/22, spinal stenosis L4-5, osteopenia/osteoporosis  are also affecting patient's functional outcome.    REHAB POTENTIAL: Good   CLINICAL DECISION MAKING: Stable/uncomplicated   EVALUATION COMPLEXITY: Low     GOALS: Goals reviewed with patient? Yes   SHORT TERM GOALS: Target date: 12/17/22 - updated 12/25/22   Pt will be independent with HEP.    Baseline: Goal status: IN PROGRESS   2.  Pt will be able to correctly perform diaphragmatic breathing and appropriate pressure management in order to prevent worsening vaginal wall laxity and improve pelvic floor A/ROM.    Baseline:  Goal status: IN PROGRESS   3.  Pt will be independent with the knack, urge suppression technique, and double voiding in order to improve bladder habits and decrease urinary/fecal incontinence.    Baseline:  Goal status: IN PROGRESS     LONG TERM GOALS: Target date: 01/28/22 - updated 12/25/22   Pt will be independent with advanced HEP.    Baseline:  Goal status: IN PROGRESS   2.  Pt will demonstrate normal pelvic floor muscle tone and A/ROM, able to achieve 3/5 strength with contractions and 10 sec endurance, in order to provide appropriate lumbopelvic support in functional activities.    Baseline:  Goal status: IN PROGRESS   3.   Pt will report improved sensation of control over urgency and continence with bowel movements without any episodes of fecal incontinence.  Baseline:  Goal status: IIN PROGRESS   4.  Pt will be able to go 2-3 hours in between voids without urgency or incontinence in order to improve QOL and perform all functional activities with less difficulty.    Baseline:  Goal status: IN PROGRESS     PLAN:   PT FREQUENCY: 1x/week   PT DURATION: 10 weeks   PLANNED INTERVENTIONS: Therapeutic exercises, Therapeutic activity, Neuromuscular re-education, Balance training, Gait training, Patient/Family education, Self Care, Joint mobilization, Dry Needling, Biofeedback, and Manual therapy   PLAN FOR NEXT SESSION: Discuss how bowel retraining is going and utilizing urge suppression technique; continue manual techniques to Lt hip; progress standing strengthening to include lunges, lateral step down, lunges with rotation, squat press. How is balloon breathing?     Heather Roberts, PT, DPT01/16/244:15 PM

## 2023-01-14 ENCOUNTER — Ambulatory Visit: Payer: Medicare Other

## 2023-01-14 DIAGNOSIS — M6281 Muscle weakness (generalized): Secondary | ICD-10-CM | POA: Diagnosis not present

## 2023-01-14 DIAGNOSIS — R293 Abnormal posture: Secondary | ICD-10-CM

## 2023-01-14 DIAGNOSIS — R279 Unspecified lack of coordination: Secondary | ICD-10-CM | POA: Diagnosis not present

## 2023-01-14 DIAGNOSIS — M25552 Pain in left hip: Secondary | ICD-10-CM | POA: Diagnosis not present

## 2023-01-14 NOTE — Therapy (Signed)
OUTPATIENT PHYSICAL THERAPY TREATMENT NOTE   Patient Name: Mackenzie Green MRN: [id number] DOB:November 12, 1946, 77 y.o., female Today's Date: 01/14/2023  PCP: Aretta Nip, MD REFERRING PROVIDER: Faustino Congress, NP  END OF SESSION:   PT End of Session - 01/14/23 1404     Visit Number 6    Date for PT Re-Evaluation 01/28/23    Authorization Type [employer] Medicare    PT Start Time 1400    PT Stop Time 1440    PT Time Calculation (min) 40 min    Activity Tolerance Patient tolerated treatment well    Behavior During Therapy WFL for tasks assessed/performed                 Past Medical History:  Diagnosis Date   Depression with anxiety    Low iron    Mild cognitive impairment with memory loss    Mitral valve prolapse    Past Surgical History:  Procedure Laterality Date   TOTAL HIP ARTHROPLASTY Left 03/27/2022   Procedure: LEFT POSTERIOR TOTAL HIP ARTHROPLASTY;  Surgeon: Willaim Sheng, MD;  Location: [facility];  Service: Orthopedics;  Laterality: Left;   Patient Active Problem List   Diagnosis Date Noted   Hip fracture ([place]) 03/26/2022   Mild cognitive impairment with memory loss 03/26/2022   Depression with anxiety 03/26/2022   Spinal stenosis at L4-L5 level 03/26/2022   Neutropenia ([place]) 08/27/2016   Weight loss 07/31/2012   Hypokalemia 07/31/2012   Hyponatremia 07/30/2012   Hypoglycemia 07/30/2012    REFERRING DIAG: N81.10 (ICD-10-CM) - Cystocele, unspecified R15.9 (ICD-10-CM) - Full incontinence of feces  THERAPY DIAG:  Abnormal posture  Muscle weakness (generalized)  Unspecified lack of coordination  Pain in left hip  Rationale for Evaluation and Treatment Rehabilitation  PERTINENT HISTORY: Lichens sclerosis, cystocele, hemorrhoids, diarrhea, 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 She still does not have pessary in until next Monday. She has been moisturizing prolapse tissue with coconut oil. She states that he leaking is about the same.    PAIN:  Are you having pain? Yes: NPRS scale: 4/10 Pain location: Lt hip Pain description: stiff Aggravating factors: gym? Relieving factors: gym?   11/19/22 SUBJECTIVE STATEMENT: Pt states that she has had issues with diarrhea and fecal incontinence; this is better after getting a pessary about a  month and a half ago. She was having vaginal bleeding and was told this was from bladder prolapse; she has not had this since pessary was placed. She also had series of UTIs and a yeast infection. She is working on trying to build bone density right now due to fall and Lt hip replacement. She has been doing pelvic floor contractions 4x/day in various positions until MD told her her muscles were too far gone to do any good, therefore she stopped. She states that she is a little overwhelmed with all of her exercises for bones/muscles. She does also go to the pool. She is active and motivated. She is having consistent yellow/brown discharge since wearing pessary - she has talked with MD about it. Her pessary was placed and will remain in place until her 3 month appointment.  Fluid intake: Yes: not discussed in detail     PAIN:  Are you having pain? Yes NPRS scale: no provided this session Pain location:  Lt hip pain   Pain type: aching Pain description: intermittent    Aggravating factors: walking Relieving factors: rest   PRECAUTIONS: None   WEIGHT BEARING RESTRICTIONS: No   FALLS:  Has patient fallen in last 6 months? No   LIVING ENVIRONMENT: Lives with: lives with their family Lives in: House/apartment     OCCUPATION: retired   PLOF:  Independent   PATIENT GOALS: to strengthen pelvic floor    PERTINENT HISTORY:  Lichens sclerosis, cystocele, hemorrhoids, diarrhea, Lt THA 04/25/22, spinal stenosis L4-5, osteopenia/osteoporosis Sexual abuse: No   BOWEL MOVEMENT: Pain with bowel movement: No Type of bowel movement:Frequency sometimes multiple times a day, sometimes just once and Strain No Fully empty rectum: Yes: fees empty, but then she still has to go Leakage: No - not in the last several weeks besides occasional leakage - at time of referral she was having bowel movement leakage outside of control every time she urinated  Pads: Yes: - Fiber supplement: No *uses wash cloth to clean   URINATION: Pain with urination: No Fully empty bladder: Yes: she states that she tries to Stream: Strong Urgency: Yes: does not hold urine well Frequency: 2x/night; every hour - unchanged since she got pessary Leakage: Urge to void and Walking to the bathroom Pads: Yes: goes through several a day, mostly for discharge    INTERCOURSE: Pain with intercourse:  not sexually active currently; history of pain since menopause     PREGNANCY: Vaginal deliveries 2 Tearing Yes: forceps delivery ; significant tearing     PROLAPSE: Cystocele had to press bladder back inside vaginal canal prior to pessary     OBJECTIVE:  01/07/23: Grade 3 anterior vaginal wall laxity without pessary Pelvic floor strength 2/5, external anal sphincter 2/5 Difficulty with appropriate bearing down/bulge - able to perform balloon breathing with cuing  11/19/22:  COGNITION: Overall cognitive status: Within functional limits for tasks assessed                          SENSATION: Light touch: Appears intact Proprioception: Appears intact   MUSCLE LENGTH:     FUNCTIONAL TESTS:      GAIT: Lt trendelenburg   POSTURE: rounded shoulders, forward head, decreased lumbar lordosis, increased thoracic kyphosis, and posterior pelvic tilt   LUMBARAROM/PROM:      LOWER EXTREMITY MMT:     PALPATION:   General  mild abdominal doming with increased abdominal pressure  External Perineal Exam: palor in tissues, fusion of labia minora with labia majora, bright red urethral tissue, red irritated issue in vestibule                             Internal Pelvic Floor no tenderness, pessary present   Patient confirms identification and approves PT to assess internal pelvic floor and treatment Yes   PELVIC MMT:   MMT eval  Vaginal 2/5, endurance 3 seconds, repeat contractions 4x  Internal Anal Sphincter    External Anal Sphincter    Puborectalis    Diastasis Recti WNL  (Blank rows = not tested)         TONE: low   PROLAPSE: Pessary present, but anterior vaginal wall laxity still noted with bear down, no rectocele appreciated with pessary   TODAY'S TREATMENT 01/14/23 Manual: Trigger Point Dry-Needling  Treatment instructions: Expect mild to moderate muscle soreness. S/S of pneumothorax if dry needled over a lung field, and to seek immediate medical attention should they occur. Patient verbalized understanding of these instructions and education.  Patient Consent Given: Yes Education handout provided: Yes Muscles treated: Lt TFL and glutes Electrical stimulation performed: No Parameters: N/A Treatment response/outcome: twitch response and improved tension  Soft tissue mobilization Lt hip Negative pressure soft tissue mobilization with cup Lt hip Neuromuscular re-education: Supine ball roll 10x bil Seated hip IR 2 x 10 Exercises: Supine piriformis stretch 60 sec bil Propped seated knee rocking 2 x 10 Therapeutic activities: Bowel retraining    TREATMENT 01/07/23 Manual: Bowel massage Rectal pelvic floor exam Pt provides verbal consent for internal vaginal/rectal pelvic floor exam.  Therapeutic activities: Addition of fiber to diet Coconut oil to perineum to help with dryness of bulge Inverted lying Continuing  exercise program as long as she is in control of abdominal pressure Self-bowel massage Squatty potty/relaxed toileting mechanics    TREATMENT 12/30/22 Manual: Manual: Trigger Point Dry-Needling  Treatment instructions: Expect mild to moderate muscle soreness. S/S of pneumothorax if dry needled over a lung field, and to seek immediate medical attention should they occur. Patient verbalized understanding of these instructions and education.  Patient Consent Given: Yes Education handout provided: Yes Muscles treated: Lt TFL and glutes Electrical stimulation performed: No Parameters: N/A Treatment response/outcome: twitch response and improved tension  Soft tissue mobilization Lt hip Negative pressure soft tissue mobilization with cup Lt hip Neuromuscular re-education: Single leg bridge with bil UE press 5x bil Pallof press 10x bil Bil UE extension 2 x 10      PATIENT EDUCATION:  Education details: see above Person educated: Patient Education method: Consulting civil engineer, Demonstration, Tactile cues, Verbal cues, and Handouts Education comprehension: verbalized understanding   HOME EXERCISE PROGRAM: Access Code: [email or web address]     ASSESSMENT:   CLINICAL IMPRESSION: Due to increase in Lt hip pain, manual techniques performed to hip to help reduce restriction and pain. She had excellent decrease in restriction and pain. Further improvement in restriction and pain with stretches and gentle strengthening exercises. We reviewed bladder retraining for better improvements in fecal incontinence and getting on regular schedule. She will continue to benefit from skilled PT intervention in order to improve urinary/bowel symptoms, perform pelvic floor strengthening program, and incorporate pelvic floor into functional strengthening with appropriate pressure management.    OBJECTIVE IMPAIRMENTS: Abnormal gait, decreased activity tolerance, decreased coordination, decreased endurance, decreased strength,  increased fascial restrictions, increased muscle spasms, impaired tone, postural dysfunction, and pain.    ACTIVITY  LIMITATIONS: continence   PARTICIPATION LIMITATIONS: community activity   PERSONAL FACTORS: 3+ comorbidities: Lichens sclerosis, cystocele, hemorrhoids, diarrhea, Lt THA 04/25/22, spinal stenosis L4-5, osteopenia/osteoporosis  are also affecting patient's functional outcome.    REHAB POTENTIAL: Good   CLINICAL DECISION MAKING: Stable/uncomplicated   EVALUATION COMPLEXITY: Low     GOALS: Goals reviewed with patient? Yes   SHORT TERM GOALS: Target date: 12/17/22 - updated 12/25/22   Pt will be independent with HEP.    Baseline: Goal status: IN PROGRESS   2.  Pt will be able to correctly perform diaphragmatic breathing and appropriate pressure management in order to prevent worsening vaginal wall laxity and improve pelvic floor A/ROM.    Baseline:  Goal status: IN PROGRESS   3.  Pt will be independent with the knack, urge suppression technique, and double voiding in order to improve bladder habits and decrease urinary/fecal incontinence.    Baseline:  Goal status: IN PROGRESS     LONG TERM GOALS: Target date: 01/28/22 - updated 12/25/22   Pt will be independent with advanced HEP.    Baseline:  Goal status: IN PROGRESS   2.  Pt will demonstrate normal pelvic floor muscle tone and A/ROM, able to achieve 3/5 strength with contractions and 10 sec endurance, in order to provide appropriate lumbopelvic support in functional activities.    Baseline:  Goal status: IN PROGRESS   3.  Pt will report improved sensation of control over urgency and continence with bowel movements without any episodes of fecal incontinence.  Baseline:  Goal status: IIN PROGRESS   4.  Pt will be able to go 2-3 hours in between voids without urgency or incontinence in order to improve QOL and perform all functional activities with less difficulty.    Baseline:  Goal status: IN PROGRESS      PLAN:   PT FREQUENCY: 1x/week   PT DURATION: 10 weeks   PLANNED INTERVENTIONS: Therapeutic exercises, Therapeutic activity, Neuromuscular re-education, Balance training, Gait training, Patient/Family education, Self Care, Joint mobilization, Dry Needling, Biofeedback, and Manual therapy   PLAN FOR NEXT SESSION: Discuss how bowel retraining is going and utilizing urge suppression technique; continue manual techniques to Lt hip; progress standing strengthening to include lunges, lateral step down, lunges with rotation, squat press. How is balloon breathing?     Heather Roberts, PT, DPT01/23/242:44 PM

## 2023-01-20 DIAGNOSIS — N8111 Cystocele, midline: Secondary | ICD-10-CM | POA: Diagnosis not present

## 2023-01-21 ENCOUNTER — Ambulatory Visit: Payer: Medicare Other

## 2023-01-21 DIAGNOSIS — R279 Unspecified lack of coordination: Secondary | ICD-10-CM | POA: Diagnosis not present

## 2023-01-21 DIAGNOSIS — R293 Abnormal posture: Secondary | ICD-10-CM | POA: Diagnosis not present

## 2023-01-21 DIAGNOSIS — M25552 Pain in left hip: Secondary | ICD-10-CM

## 2023-01-21 DIAGNOSIS — M6281 Muscle weakness (generalized): Secondary | ICD-10-CM

## 2023-01-21 NOTE — Therapy (Signed)
OUTPATIENT PHYSICAL THERAPY TREATMENT NOTE   Patient Name: Mackenzie Green MRN: [id number] DOB:04-Feb-1946, 77 y.o., female Today's Date: 01/21/2023  PCP: Aretta Nip, MD REFERRING PROVIDER: Faustino Congress, NP  END OF SESSION:   PT End of Session - 01/21/23 1403     Visit Number 7    Date for PT Re-Evaluation 01/28/23    Authorization Type [employer] Medicare    PT Start Time 0200    PT Stop Time 0240    PT Time Calculation (min) 40 min    Activity Tolerance Patient tolerated treatment well    Behavior During Therapy WFL for tasks assessed/performed                 Past Medical History:  Diagnosis Date   Depression with anxiety    Low iron    Mild cognitive impairment with memory loss    Mitral valve prolapse    Past Surgical History:  Procedure Laterality Date   TOTAL HIP ARTHROPLASTY Left 03/27/2022   Procedure: LEFT POSTERIOR TOTAL HIP ARTHROPLASTY;  Surgeon: Willaim Sheng, MD;  Location: [facility];  Service: Orthopedics;  Laterality: Left;   Patient Active Problem List   Diagnosis Date Noted   Hip fracture ([place]) 03/26/2022   Mild cognitive impairment with memory loss 03/26/2022   Depression with anxiety 03/26/2022   Spinal stenosis at L4-L5 level 03/26/2022   Neutropenia ([place]) 08/27/2016   Weight loss 07/31/2012   Hypokalemia 07/31/2012   Hyponatremia 07/30/2012   Hypoglycemia 07/30/2012    REFERRING DIAG: N81.10 (ICD-10-CM) - Cystocele, unspecified R15.9 (ICD-10-CM) - Full incontinence of feces  THERAPY DIAG:  Abnormal posture  Muscle weakness (generalized)  Unspecified lack of coordination  Pain in left hip  Rationale for Evaluation and Treatment Rehabilitation  PERTINENT HISTORY: Lichens sclerosis, cystocele, hemorrhoids, diarrhea, Lt THA 04/25/22, spinal stenosis L4-5, osteopenia/osteoporosis  PRECAUTIONS: NA  SUBJECTIVE:                                                                                                                                                                                       SUBJECTIVE STATEMENT:  Pt reports notable improvement in fecal incontinence and consistency of bowel movements. She is having less Lt hip/low back pain but more pain that goes down Lt lateral thigh. She is experiencing some urinary urgency/increased frequency.    PAIN:  Are you having pain? Yes: NPRS scale: 4/10 Pain location: Lt hip Pain description: stiff Aggravating factors: gym? Relieving factors: gym?   11/19/22 SUBJECTIVE STATEMENT: Pt states that she has had issues with diarrhea and fecal incontinence; this is better after getting a pessary about a month and a half  ago. She was having vaginal bleeding and was told this was from bladder prolapse; she has not had this since pessary was placed. She also had series of UTIs and a yeast infection. She is working on trying to build bone density right now due to fall and Lt hip replacement. She has been doing pelvic floor contractions 4x/day in various positions until MD told her her muscles were too far gone to do any good, therefore she stopped. She states that she is a little overwhelmed with all of her exercises for bones/muscles. She does also go to the pool. She is active and motivated. She is having consistent yellow/brown discharge since wearing pessary - she has talked with MD about it. Her pessary was placed and will remain in place until her 3 month appointment.  Fluid intake: Yes: not discussed in detail     PAIN:  Are you having pain? Yes NPRS scale: no provided this session Pain location:  Lt hip pain   Pain type: aching Pain description: intermittent    Aggravating factors: walking Relieving factors: rest   PRECAUTIONS: None   WEIGHT BEARING RESTRICTIONS: No   FALLS:  Has patient fallen in last 6 months? No   LIVING ENVIRONMENT: Lives with: lives with their family Lives in: House/apartment     OCCUPATION: retired   PLOF: Independent    PATIENT GOALS: to strengthen pelvic floor    PERTINENT HISTORY:  Lichens sclerosis, cystocele, hemorrhoids, diarrhea, Lt THA 04/25/22, spinal stenosis L4-5, osteopenia/osteoporosis Sexual abuse: No   BOWEL MOVEMENT: Pain with bowel movement: No Type of bowel movement:Frequency sometimes multiple times a day, sometimes just once and Strain No Fully empty rectum: Yes: fees empty, but then she still has to go Leakage: No - not in the last several weeks besides occasional leakage - at time of referral she was having bowel movement leakage outside of control every time she urinated  Pads: Yes: - Fiber supplement: No *uses wash cloth to clean   URINATION: Pain with urination: No Fully empty bladder: Yes: she states that she tries to Stream: Strong Urgency: Yes: does not hold urine well Frequency: 2x/night; every hour - unchanged since she got pessary Leakage: Urge to void and Walking to the bathroom Pads: Yes: goes through several a day, mostly for discharge    INTERCOURSE: Pain with intercourse:  not sexually active currently; history of pain since menopause     PREGNANCY: Vaginal deliveries 2 Tearing Yes: forceps delivery ; significant tearing     PROLAPSE: Cystocele had to press bladder back inside vaginal canal prior to pessary     OBJECTIVE:  01/07/23: Grade 3 anterior vaginal wall laxity without pessary Pelvic floor strength 2/5, external anal sphincter 2/5 Difficulty with appropriate bearing down/bulge - able to perform balloon breathing with cuing  11/19/22:  COGNITION: Overall cognitive status: Within functional limits for tasks assessed                          SENSATION: Light touch: Appears intact Proprioception: Appears intact   MUSCLE LENGTH:     FUNCTIONAL TESTS:      GAIT: Lt trendelenburg   POSTURE: rounded shoulders, forward head, decreased lumbar lordosis, increased thoracic kyphosis, and posterior pelvic tilt   LUMBARAROM/PROM:     LOWER  EXTREMITY MMT:     PALPATION:   General  mild abdominal doming with increased abdominal pressure  External Perineal Exam: palor in tissues, fusion of labia minora with labia majora, bright red urethral tissue, red irritated issue in vestibule                             Internal Pelvic Floor no tenderness, pessary present   Patient confirms identification and approves PT to assess internal pelvic floor and treatment Yes   PELVIC MMT:   MMT eval  Vaginal 2/5, endurance 3 seconds, repeat contractions 4x  Internal Anal Sphincter    External Anal Sphincter    Puborectalis    Diastasis Recti WNL  (Blank rows = not tested)         TONE: low   PROLAPSE: Pessary present, but anterior vaginal wall laxity still noted with bear down, no rectocele appreciated with pessary   TODAY'S TREATMENT 01/21/23 Manual: Trigger Point Dry-Needling  Treatment instructions: Expect mild to moderate muscle soreness. S/S of pneumothorax if dry needled over a lung field, and to seek immediate medical attention should they occur. Patient verbalized understanding of these instructions and education.  Patient Consent Given: Yes Education handout provided: Yes Muscles treated: Lt TFL and glutes Electrical stimulation performed: No Parameters: N/A Treatment response/outcome: twitch response and improved tension  Soft tissue mobilization Lt hip/Lt thigh Exercises: Heel raises 2 x 10 Standing march 2 x 10    TREATMENT 01/14/23 Manual: Trigger Point Dry-Needling  Treatment instructions: Expect mild to moderate muscle soreness. S/S of pneumothorax if dry needled over a lung field, and to seek immediate medical attention should they occur. Patient verbalized understanding of these instructions and education.  Patient Consent Given: Yes Education handout provided: Yes Muscles treated: Lt TFL and glutes Electrical stimulation performed: No Parameters: N/A Treatment response/outcome:  twitch response and improved tension  Soft tissue mobilization Lt hip Negative pressure soft tissue mobilization with cup Lt hip Neuromuscular re-education: Supine ball roll 10x bil Seated hip IR 2 x 10 Exercises: Supine piriformis stretch 60 sec bil Propped seated knee rocking 2 x 10 Therapeutic activities: Bowel retraining    TREATMENT 01/07/23 Manual: Bowel massage Rectal pelvic floor exam Pt provides verbal consent for internal vaginal/rectal pelvic floor exam.  Therapeutic activities: Addition of fiber to diet Coconut oil to perineum to help with dryness of bulge Inverted lying Continuing exercise program as long as she is in control of abdominal pressure Self-bowel massage Squatty potty/relaxed toileting mechanics     PATIENT EDUCATION:  Education details: see above Person educated: Patient Education method: Consulting civil engineer, Demonstration, Tactile cues, Verbal cues, and Handouts Education comprehension: verbalized understanding   HOME EXERCISE PROGRAM: Access Code: [email or web address]     ASSESSMENT:   CLINICAL IMPRESSION: Pt seeing progress with improved LT hip/LB pain, but had an increase in pain down Lt lateral thigh. She had significant restriction in this area, but responded very well to dry needling and manual techniques. Standing exercises able to be progressed for core/pelvic floor, and LE muscle strengthening; she continues to have significant difficulty with single leg stance on Lt lower extremity. She was encouraged to use external support in order to focus on balance and stability and avoiding aggravating other pain in low back. We discussed getting on strict bladder training program/voiding schedule in addition to use of the urge suppression technique. She will continue to benefit from skilled PT intervention in order to improve urinary/bowel symptoms, perform pelvic floor strengthening program, and incorporate pelvic floor into functional strengthening with appropriate  pressure management.  OBJECTIVE IMPAIRMENTS: Abnormal gait, decreased activity tolerance, decreased coordination, decreased endurance, decreased strength, increased fascial restrictions, increased muscle spasms, impaired tone, postural dysfunction, and pain.    ACTIVITY LIMITATIONS: continence   PARTICIPATION LIMITATIONS: community activity   PERSONAL FACTORS: 3+ comorbidities: Lichens sclerosis, cystocele, hemorrhoids, diarrhea, Lt THA 04/25/22, spinal stenosis L4-5, osteopenia/osteoporosis  are also affecting patient's functional outcome.    REHAB POTENTIAL: Good   CLINICAL DECISION MAKING: Stable/uncomplicated   EVALUATION COMPLEXITY: Low     GOALS: Goals reviewed with patient? Yes   SHORT TERM GOALS: Target date: 12/17/22 - updated 12/25/22   Pt will be independent with HEP.    Baseline: Goal status: IN PROGRESS   2.  Pt will be able to correctly perform diaphragmatic breathing and appropriate pressure management in order to prevent worsening vaginal wall laxity and improve pelvic floor A/ROM.    Baseline:  Goal status: IN PROGRESS   3.  Pt will be independent with the knack, urge suppression technique, and double voiding in order to improve bladder habits and decrease urinary/fecal incontinence.    Baseline:  Goal status: IN PROGRESS     LONG TERM GOALS: Target date: 01/28/22 - updated 12/25/22   Pt will be independent with advanced HEP.    Baseline:  Goal status: IN PROGRESS   2.  Pt will demonstrate normal pelvic floor muscle tone and A/ROM, able to achieve 3/5 strength with contractions and 10 sec endurance, in order to provide appropriate lumbopelvic support in functional activities.    Baseline:  Goal status: IN PROGRESS   3.  Pt will report improved sensation of control over urgency and continence with bowel movements without any episodes of fecal incontinence.  Baseline:  Goal status: IIN PROGRESS   4.  Pt will be able to go 2-3 hours in between voids  without urgency or incontinence in order to improve QOL and perform all functional activities with less difficulty.    Baseline:  Goal status: IN PROGRESS     PLAN:   PT FREQUENCY: 1x/week   PT DURATION: 10 weeks   PLANNED INTERVENTIONS: Therapeutic exercises, Therapeutic activity, Neuromuscular re-education, Balance training, Gait training, Patient/Family education, Self Care, Joint mobilization, Dry Needling, Biofeedback, and Manual therapy   PLAN FOR NEXT SESSION: Check on urge suppression technique; progress strengthening.      Heather Roberts, PT, DPT01/30/242:47 PM

## 2023-01-23 NOTE — Progress Notes (Signed)
[facility] New Patient Evaluation and Consultation  Referring Provider: Robley Fries, MD PCP: Aretta Nip, MD Date of Service: 01/24/2023  SUBJECTIVE Chief Complaint: New Patient (Initial Visit) Mackenzie Green is a 77 y.o. female here for a consult for vaginal bleeding, prolapse and urinary urgency.)  History of Present Illness: Mackenzie Green is a 76 y.o. White or Caucasian female seen in consultation at the request of Dr. Claudia Desanctis for evaluation of prolapse.    Review of records from Dr Claudia Desanctis significant for: Currently using pessary and vaginal estrogen for prolapse. Has noticed some bleeding.   Urinary Symptoms: Does not leak urine.  Uses two pads per day for discharge with pessary and bleeding.   Day time voids- every 1.5-2 hrs.  Nocturia: 2 times per night to void. Voiding dysfunction: she empties her bladder well.  does not use a catheter to empty bladder.  When urinating, she feels the need to urinate multiple times in a row  UTIs: 2 UTI's in the last year.   Denies history of blood in urine and kidney or bladder stones  Pelvic Organ Prolapse Symptoms:                  Admits to a feeling of a bulge the vaginal area.   Admits to seeing a bulge.  This bulge is bothersome.  Before she had the pessary in place, she noticed some vaginal bleeding last September initially. She had a pessary placed for 3 months and then it caused some abrasions in the vagina. Also had a lot of discharge in the vagina with the pessary. They left it out for two weeks but noticed some blood in the vagina again and did not replace the pessary. She has been using vaginal estrogen every day per instructions given to her at [facility]- has been doing this for the last few months then she cut back to twice a week. Has not noticed bleeding recently.   Bowel Symptom: Bowel movements: alternates between 0 and 4-5 per day. Has IBS.  Stool consistency: loose Straining: no.  Splinting:  no.  Incomplete evacuation: yes.   Admits to accidental bowel leakage / fecal incontinence. Has been doing pelvic PT at [facility].  Occurs: after most bowel movements  Consistency with leakage: soft  Bowel regimen: diet and fiber (fiber capsule 3 times per day) Last colonoscopy: Date 10/2021- negative  Sexual Function Sexually active: no.    Pelvic Pain Denies pelvic pain    Past Medical History:  Past Medical History:  Diagnosis Date   Low iron    Mild cognitive impairment with memory loss    Mitral valve prolapse      Past Surgical History:   Past Surgical History:  Procedure Laterality Date   TEAR DUCT PROBING     TOTAL HIP ARTHROPLASTY Left 03/27/2022   Procedure: LEFT POSTERIOR TOTAL HIP ARTHROPLASTY;  Surgeon: Willaim Sheng, MD;  Location: [facility];  Service: Orthopedics;  Laterality: Left;     Past OB/GYN History: OB History  Gravida Para Term Preterm AB Living  '4 2 2   2 2  '$ SAB IAB Ectopic Multiple Live Births  2       2    # Outcome Date GA Lbr Len/2nd Weight Sex Delivery Anes PTL Lv  4 Term      Vag-Spont     3 Term      Vag-Forceps     2 SAB  1 SAB             Menopausal: Yes, at age 37, Admits to vaginal bleeding since menopause Last pap smear was several years ago.  Any history of abnormal pap smears: no.   Medications: She has a current medication list which includes the following prescription(s): biotin, calcium, cephalexin, cholecalciferol, clobetasol prop emollient base, ferrous sulfate, iron, fluconazole, fluzone high-dose quadrivalent, folic acid, magnesium citrate, vitamin k2, multivitamin adult, ascorbic acid, vitamin k, and cholestyramine.   Allergies: Patient is allergic to dairy aid [tilactase].   Social History:  Social History   Tobacco Use   Smoking status: Never   Smokeless tobacco: Never  Vaping Use   Vaping Use: Never used  Substance Use Topics   Alcohol use: Yes    Comment: socially   Drug use: No     Relationship status: divorced She lives alone.   She is not employed. Regular exercise: Yes: fitness classes, stretches, weights History of abuse: No  Family History:   Family History  Problem Relation Age of Onset   Cancer Mother    Melanoma Mother    Diabetes Father    Breast cancer Neg Hx      Review of Systems: Review of Systems  Constitutional:  Negative for fever, malaise/fatigue and weight loss.  Respiratory:  Negative for cough, shortness of breath and wheezing.   Cardiovascular:  Negative for chest pain, palpitations and leg swelling.  Gastrointestinal:  Positive for abdominal pain. Negative for blood in stool.  Genitourinary:  Negative for dysuria.       + vaginal discharge  Musculoskeletal:  Positive for myalgias.  Skin:  Negative for rash.  Neurological:  Negative for dizziness and headaches.  Endo/Heme/Allergies:  Bruises/bleeds easily.  Psychiatric/Behavioral:  Negative for depression. The patient is not nervous/anxious.      OBJECTIVE Physical Exam: Vitals:   01/24/23 0955  BP: 102/78  Pulse: 73  Weight: 106 lb 12.8 oz (48.4 kg)  Height: 5' 5.35" (1.66 m)    Physical Exam Constitutional:      General: She is not in acute distress. Pulmonary:     Effort: Pulmonary effort is normal.  Abdominal:     General: There is no distension.     Palpations: Abdomen is soft.     Tenderness: There is no abdominal tenderness. There is no rebound.  Musculoskeletal:        General: No swelling. Normal range of motion.  Skin:    General: Skin is warm and dry.     Findings: No rash.  Neurological:     Mental Status: She is alert and oriented to person, place, and time.  Psychiatric:        Mood and Affect: Mood normal.        Behavior: Behavior normal.      GU / Detailed Urogynecologic Evaluation:  Pelvic Exam: Lichen sclerosus noted around clitoral hood and around anus; Bartholin's and Skene's glands normal in appearance; urethral meatus normal with  caruncle, no urethral masses or discharge.   CST: negative  Speculum exam reveals normal vaginal mucosa with atrophy. Cervix lesions, at 7'oclock, appears to be granulation tissue a base of cervix, bleeds with touch. Pap smear obtained . Uterus normal single, nontender. Adnexa no mass, fullness, tenderness.    Pelvic floor strength I/V, puborectalis II/V external anal sphincter I/V  Pelvic floor musculature: Right levator non-tender, Right obturator non-tender, Left levator non-tender, Left obturator non-tender  POP-Q:   POP-Q  2  Aa   2                                           Ba  -5                                              C   4.5                                            Gh  5                                            Pb  7                                            tvl   -2                                            Ap  -2                                            Bp  -5                                              D      Rectal Exam:  Normal sphincter tone, small distal rectocele, enterocoele not present, no rectal masses, no sign of dyssynergia when asking the patient to bear down.  Post-Void Residual (PVR) by Bladder Scan: In order to evaluate bladder emptying, we discussed obtaining a postvoid residual and she agreed to this procedure.  Procedure: The ultrasound unit was placed on the patient's abdomen in the suprapubic region after the patient had voided. A PVR of 0 ml was obtained by bladder scan.  Laboratory Results: POC urine: trace leukocytes   ASSESSMENT AND PLAN Ms. Schmiesing is a 77 y.o. with:  1. Cervical lesion   2. Post-menopausal bleeding   3. Prolapse of anterior vaginal wall   4. Uterovaginal prolapse, incomplete   5. Urinary frequency   6. Lichen sclerosus    Postmenopausal bleeding/ cervical lesion - pap smear obtained today. Lesion appears to be granulation tissue but patient reports  bleeding prior to pessary use - Will also obtain pelvic US to assess the endometrium further.  - We discussed that if there are any abnormal results then she will be referred to general gynecology for further workup of the bleeding.   2. Stage III anterior, Stage I posterior, Stage I apical prolapse - For treatment of pelvic organ prolapse, we discussed options for  management including expectant management, conservative management, and surgical management, such as Kegels, a pessary, pelvic floor physical therapy, and specific surgical procedures. - Has tried regula and incontinence ring pessaries. We discussed that if pap and TVUS are negative then can try another pessary fitting. She would like to be able to remove the pessary herself because she had a lot of discharge.  - Recommended to continue estrogen cream twice a week for atrophy.   3. Lichen sclerosus - Has been treated previously with clobetasol but not in several years.  - Rx for clobetasol ordered and advised to place on vulva/ clitoral hood nightly for two weeks then twice a week after.   Return 3-4 weeks  Jaquita Folds, MD

## 2023-01-24 ENCOUNTER — Other Ambulatory Visit (HOSPITAL_COMMUNITY)
Admission: RE | Admit: 2023-01-24 | Discharge: 2023-01-24 | Disposition: A | Discharge status: Home / Self Care | Payer: Medicare Other | Source: Ambulatory Visit | Attending: Obstetrics and Gynecology | Admitting: Obstetrics and Gynecology

## 2023-01-24 ENCOUNTER — Ambulatory Visit: Payer: Medicare Other | Admitting: Obstetrics and Gynecology

## 2023-01-24 ENCOUNTER — Encounter: Payer: Self-pay | Admitting: Obstetrics and Gynecology

## 2023-01-24 VITALS — BP 102/78 | HR 73 | Ht 65.35 in | Wt 106.8 lb

## 2023-01-24 DIAGNOSIS — L900 Lichen sclerosus et atrophicus: Secondary | ICD-10-CM

## 2023-01-24 DIAGNOSIS — R350 Frequency of micturition: Secondary | ICD-10-CM | POA: Diagnosis not present

## 2023-01-24 DIAGNOSIS — Z01419 Encounter for gynecological examination (general) (routine) without abnormal findings: Secondary | ICD-10-CM | POA: Diagnosis not present

## 2023-01-24 DIAGNOSIS — B3731 Acute candidiasis of vulva and vagina: Secondary | ICD-10-CM

## 2023-01-24 DIAGNOSIS — N889 Noninflammatory disorder of cervix uteri, unspecified: Secondary | ICD-10-CM

## 2023-01-24 DIAGNOSIS — N812 Incomplete uterovaginal prolapse: Secondary | ICD-10-CM | POA: Diagnosis not present

## 2023-01-24 DIAGNOSIS — N8110 Cystocele, unspecified: Secondary | ICD-10-CM

## 2023-01-24 DIAGNOSIS — N950 Postmenopausal bleeding: Secondary | ICD-10-CM | POA: Insufficient documentation

## 2023-01-24 LAB — POCT URINALYSIS DIPSTICK
Bilirubin, UA: NEGATIVE
Blood, UA: NEGATIVE
Glucose, UA: NEGATIVE
Ketones, UA: NEGATIVE
Nitrite, UA: NEGATIVE
Protein, UA: NEGATIVE
Spec Grav, UA: 1.015 (ref 1.010–1.025)
Urobilinogen, UA: 0.2 E.U./dL
pH, UA: 7.5 (ref 5.0–8.0)

## 2023-01-24 MED ORDER — CLOBETASOL PROP EMOLLIENT BASE 0.05 % EX CREA: TOPICAL_CREAM | CUTANEOUS | 11 refills | Status: DC

## 2023-01-24 NOTE — Patient Instructions (Signed)
You have a stage 3 (out of 4) prolapse.  We discussed the fact that it is not life threatening but there are several treatment options. For treatment of pelvic organ prolapse, we discussed options for management including expectant management, conservative management, and surgical management, such as Kegels, a pessary, pelvic floor physical therapy, and specific surgical procedures.    A pap smear was obtained today. We will let you know of the results.   An ultrasound has been ordered for you of the pelvis. Please call to schedule prior to your next appointment.

## 2023-01-28 DIAGNOSIS — Z9181 History of falling: Secondary | ICD-10-CM | POA: Diagnosis not present

## 2023-01-28 DIAGNOSIS — Z0000 Encounter for general adult medical examination without abnormal findings: Secondary | ICD-10-CM | POA: Diagnosis not present

## 2023-01-30 LAB — CYTOLOGY - PAP: Diagnosis: NEGATIVE

## 2023-01-30 MED ORDER — FLUCONAZOLE 150 MG PO TABS: 150.0000 mg | ORAL_TABLET | Freq: Once | ORAL | 0 refills | Status: AC

## 2023-01-30 NOTE — Addendum Note (Signed)
Addended by: Jaquita Folds on: 01/30/2023 12:49 PM   Modules accepted: Orders

## 2023-01-30 NOTE — Progress Notes (Signed)
Patient has been notified

## 2023-01-31 ENCOUNTER — Ambulatory Visit (HOSPITAL_BASED_OUTPATIENT_CLINIC_OR_DEPARTMENT_OTHER)
Admission: RE | Admit: 2023-01-31 | Discharge: 2023-01-31 | Disposition: A | Discharge status: Home / Self Care | Payer: Medicare Other | Source: Ambulatory Visit | Attending: Obstetrics and Gynecology | Admitting: Obstetrics and Gynecology

## 2023-01-31 DIAGNOSIS — N950 Postmenopausal bleeding: Secondary | ICD-10-CM | POA: Insufficient documentation

## 2023-02-03 ENCOUNTER — Telehealth: Payer: Self-pay | Admitting: *Deleted

## 2023-02-03 DIAGNOSIS — M25552 Pain in left hip: Secondary | ICD-10-CM | POA: Diagnosis not present

## 2023-02-03 NOTE — Telephone Encounter (Signed)
Pt had pelvic imaging that you ordered done on 01/31/23 and is inquiring about results.  Can you please advise what to tell patient about her ultrasound?  She seemed anxious.  Thanks Sharrie Rothman CMA

## 2023-02-05 ENCOUNTER — Ambulatory Visit: Payer: Medicare Other | Attending: Family Medicine

## 2023-02-05 DIAGNOSIS — R293 Abnormal posture: Secondary | ICD-10-CM | POA: Insufficient documentation

## 2023-02-05 DIAGNOSIS — M6281 Muscle weakness (generalized): Secondary | ICD-10-CM | POA: Insufficient documentation

## 2023-02-05 DIAGNOSIS — R279 Unspecified lack of coordination: Secondary | ICD-10-CM | POA: Diagnosis not present

## 2023-02-05 DIAGNOSIS — M62838 Other muscle spasm: Secondary | ICD-10-CM | POA: Diagnosis not present

## 2023-02-05 DIAGNOSIS — M25552 Pain in left hip: Secondary | ICD-10-CM | POA: Insufficient documentation

## 2023-02-05 NOTE — Therapy (Signed)
OUTPATIENT PHYSICAL THERAPY TREATMENT NOTE   Patient Name: Mackenzie Green MRN: [id number] DOB:1946-11-17, 77 y.o., female Today's Date: 02/05/2023  PCP: Aretta Nip, MD REFERRING PROVIDER: Faustino Congress, NP  END OF SESSION:   PT End of Session - 02/05/23 1233     Visit Number 8    Date for PT Re-Evaluation 01/28/23    Authorization Type [employer] Medicare    PT Start Time 1230    PT Stop Time 1310    PT Time Calculation (min) 40 min    Activity Tolerance Patient tolerated treatment well    Behavior During Therapy WFL for tasks assessed/performed                  Past Medical History:  Diagnosis Date   Low iron    Mild cognitive impairment with memory loss    Mitral valve prolapse    Past Surgical History:  Procedure Laterality Date   TEAR DUCT PROBING     TOTAL HIP ARTHROPLASTY Left 03/27/2022   Procedure: LEFT POSTERIOR TOTAL HIP ARTHROPLASTY;  Surgeon: Willaim Sheng, MD;  Location: [facility];  Service: Orthopedics;  Laterality: Left;   Patient Active Problem List   Diagnosis Date Noted   Hip fracture ([place]) 03/26/2022   Mild cognitive impairment with memory loss 03/26/2022   Depression with anxiety 03/26/2022   Spinal stenosis at L4-L5 level 03/26/2022   Neutropenia ([place]) 08/27/2016   Weight loss 07/31/2012   Hypokalemia 07/31/2012   Hyponatremia 07/30/2012   Hypoglycemia 07/30/2012    REFERRING DIAG: N81.10 (ICD-10-CM) - Cystocele, unspecified R15.9 (ICD-10-CM) - Full incontinence of feces  THERAPY DIAG:  Abnormal posture  Muscle weakness (generalized)  Unspecified lack of coordination  Pain in left hip  Rationale for Evaluation and Treatment Rehabilitation  PERTINENT HISTORY: Lichens sclerosis, cystocele, hemorrhoids, diarrhea, Lt THA 04/25/22, spinal stenosis L4-5, osteopenia/osteoporosis  PRECAUTIONS: NA  SUBJECTIVE:                                                                                                                                                                                       SUBJECTIVE STATEMENT:  Pt states that she is experiencing much more pain since her last visit in her Lt hip. She went to doctor and received Cortisone and motrin injection two days ago. She has not felt much improvement. She has not been able to work on exercises for pelvic floor due to all the pain that she has been in in Lt hip. She reports having several large episodes of fecal incontinence.    PAIN:  Are you having pain? Yes: NPRS scale: 4/10 Pain location: Lt hip Pain description: stiff  Aggravating factors: gym? Relieving factors: gym?   11/19/22 SUBJECTIVE STATEMENT: Pt states that she has had issues with diarrhea and fecal incontinence; this is better after getting a pessary about a month and a half ago. She was having vaginal bleeding and was told this was from bladder prolapse; she has not had this since pessary was placed. She also had series of UTIs and a yeast infection. She is working on trying to build bone density right now due to fall and Lt hip replacement. She has been doing pelvic floor contractions 4x/day in various positions until MD told her her muscles were too far gone to do any good, therefore she stopped. She states that she is a little overwhelmed with all of her exercises for bones/muscles. She does also go to the pool. She is active and motivated. She is having consistent yellow/brown discharge since wearing pessary - she has talked with MD about it. Her pessary was placed and will remain in place until her 3 month appointment.  Fluid intake: Yes: not discussed in detail     PAIN:  Are you having pain? Yes NPRS scale: no provided this session Pain location:  Lt hip pain   Pain type: aching Pain description: intermittent    Aggravating factors: walking Relieving factors: rest   PRECAUTIONS: None   WEIGHT BEARING RESTRICTIONS: No   FALLS:  Has patient fallen in last 6 months? No   LIVING  ENVIRONMENT: Lives with: lives with their family Lives in: House/apartment     OCCUPATION: retired   PLOF: Independent   PATIENT GOALS: to strengthen pelvic floor    PERTINENT HISTORY:  Lichens sclerosis, cystocele, hemorrhoids, diarrhea, Lt THA 04/25/22, spinal stenosis L4-5, osteopenia/osteoporosis Sexual abuse: No   BOWEL MOVEMENT: Pain with bowel movement: No Type of bowel movement:Frequency sometimes multiple times a day, sometimes just once and Strain No Fully empty rectum: Yes: fees empty, but then she still has to go Leakage: No - not in the last several weeks besides occasional leakage - at time of referral she was having bowel movement leakage outside of control every time she urinated  Pads: Yes: - Fiber supplement: No *uses wash cloth to clean   URINATION: Pain with urination: No Fully empty bladder: Yes: she states that she tries to Stream: Strong Urgency: Yes: does not hold urine well Frequency: 2x/night; every hour - unchanged since she got pessary Leakage: Urge to void and Walking to the bathroom Pads: Yes: goes through several a day, mostly for discharge    INTERCOURSE: Pain with intercourse:  not sexually active currently; history of pain since menopause     PREGNANCY: Vaginal deliveries 2 Tearing Yes: forceps delivery ; significant tearing     PROLAPSE: Cystocele had to press bladder back inside vaginal canal prior to pessary     OBJECTIVE:  01/07/23: Grade 3 anterior vaginal wall laxity without pessary Pelvic floor strength 2/5, external anal sphincter 2/5 Difficulty with appropriate bearing down/bulge - able to perform balloon breathing with cuing  11/19/22:  COGNITION: Overall cognitive status: Within functional limits for tasks assessed                          SENSATION: Light touch: Appears intact Proprioception: Appears intact   MUSCLE LENGTH:     FUNCTIONAL TESTS:      GAIT: Lt trendelenburg   POSTURE: rounded shoulders,  forward head, decreased lumbar lordosis, increased thoracic kyphosis, and posterior pelvic tilt   LUMBARAROM/PROM:  LOWER EXTREMITY MMT:     PALPATION:   General  mild abdominal doming with increased abdominal pressure                 External Perineal Exam: palor in tissues, fusion of labia minora with labia majora, bright red urethral tissue, red irritated issue in vestibule                             Internal Pelvic Floor no tenderness, pessary present   Patient confirms identification and approves PT to assess internal pelvic floor and treatment Yes   PELVIC MMT:   MMT eval  Vaginal 2/5, endurance 3 seconds, repeat contractions 4x  Internal Anal Sphincter    External Anal Sphincter    Puborectalis    Diastasis Recti WNL  (Blank rows = not tested)         TONE: low   PROLAPSE: Pessary present, but anterior vaginal wall laxity still noted with bear down, no rectocele appreciated with pessary   TODAY'S TREATMENT 02/05/23 Manual: Gentle soft tissue mobilization to Lt hip Exercises: Supine hip adduction 2 x 10 Supine straight leg raise with core activation 2 x 10 Supine march 3 x 10   TREATMENT 01/21/23 Manual: Trigger Point Dry-Needling  Treatment instructions: Expect mild to moderate muscle soreness. S/S of pneumothorax if dry needled over a lung field, and to seek immediate medical attention should they occur. Patient verbalized understanding of these instructions and education.  Patient Consent Given: Yes Education handout provided: Yes Muscles treated: Lt TFL and glutes Electrical stimulation performed: No Parameters: N/A Treatment response/outcome: twitch response and improved tension  Soft tissue mobilization Lt hip/Lt thigh Exercises: Heel raises 2 x 10 Standing march 2 x 10    TREATMENT 01/14/23 Manual: Trigger Point Dry-Needling  Treatment instructions: Expect mild to moderate muscle soreness. S/S of pneumothorax if dry needled over a lung  field, and to seek immediate medical attention should they occur. Patient verbalized understanding of these instructions and education.  Patient Consent Given: Yes Education handout provided: Yes Muscles treated: Lt TFL and glutes Electrical stimulation performed: No Parameters: N/A Treatment response/outcome: twitch response and improved tension  Soft tissue mobilization Lt hip Negative pressure soft tissue mobilization with cup Lt hip Neuromuscular re-education: Supine ball roll 10x bil Seated hip IR 2 x 10 Exercises: Supine piriformis stretch 60 sec bil Propped seated knee rocking 2 x 10 Therapeutic activities: Bowel retraining  PATIENT EDUCATION:  Education details: see above Person educated: Patient Education method: Explanation, Demonstration, Tactile cues, Verbal cues, and Handouts Education comprehension: verbalized understanding   HOME EXERCISE PROGRAM: Access Code: [email or web address]     ASSESSMENT:   CLINICAL IMPRESSION: Pt feeling very discouraged today due to large increase in Lt hip pain over the last two weeks. She did not want to address pain today with dry needling due to pain increasing after last treatment session and wanting to see if the cortisone injection is helpful from two days ago. She tolerated gentle core/pelvic floor/hip strengthening activities well today with little increase in pain. She did not have any improvement in Lt hip pain throughout session and continued to have difficulty with ambulation at end of session (she is ambulating with SPC in Rt UE). She was encouraged to return to pelvic floor intervention as able over the next week. Believe muscle spasm in Lt hip may be exacerbating condition of pelvic floor and preventing appropriate muscle facilitation due to pain,  leading to increased episodes of fecal incontinence. Much of today's session spent discussing increase in pain and what it may be due to as she is very concerned. With limited time,  re-evaluation delayed until next treatment session. She will continue to benefit from skilled PT intervention in order to improve urinary/bowel symptoms, perform pelvic floor strengthening program, and incorporate pelvic floor into functional strengthening with appropriate pressure management.    OBJECTIVE IMPAIRMENTS: Abnormal gait, decreased activity tolerance, decreased coordination, decreased endurance, decreased strength, increased fascial restrictions, increased muscle spasms, impaired tone, postural dysfunction, and pain.    ACTIVITY LIMITATIONS: continence   PARTICIPATION LIMITATIONS: community activity   PERSONAL FACTORS: 3+ comorbidities: Lichens sclerosis, cystocele, hemorrhoids, diarrhea, Lt THA 04/25/22, spinal stenosis L4-5, osteopenia/osteoporosis  are also affecting patient's functional outcome.    REHAB POTENTIAL: Good   CLINICAL DECISION MAKING: Stable/uncomplicated   EVALUATION COMPLEXITY: Low     GOALS: Goals reviewed with patient? Yes   SHORT TERM GOALS: Target date: 12/17/22 - updated 12/25/22 - updated 02/05/23   Pt will be independent with HEP.    Baseline: Goal status: IN PROGRESS   2.  Pt will be able to correctly perform diaphragmatic breathing and appropriate pressure management in order to prevent worsening vaginal wall laxity and improve pelvic floor A/ROM.    Baseline:  Goal status: IN PROGRESS   3.  Pt will be independent with the knack, urge suppression technique, and double voiding in order to improve bladder habits and decrease urinary/fecal incontinence.    Baseline:  Goal status: IN PROGRESS     LONG TERM GOALS: Target date: 01/28/22 - updated 12/25/22 - updated 02/05/23   Pt will be independent with advanced HEP.    Baseline:  Goal status: IN PROGRESS   2.  Pt will demonstrate normal pelvic floor muscle tone and A/ROM, able to achieve 3/5 strength with contractions and 10 sec endurance, in order to provide appropriate lumbopelvic support in  functional activities.    Baseline:  Goal status: IN PROGRESS   3.  Pt will report improved sensation of control over urgency and continence with bowel movements without any episodes of fecal incontinence.  Baseline:  Goal status: IIN PROGRESS   4.  Pt will be able to go 2-3 hours in between voids without urgency or incontinence in order to improve QOL and perform all functional activities with less difficulty.    Baseline:  Goal status: IN PROGRESS     PLAN:   PT FREQUENCY: 1x/week   PT DURATION: 10 weeks   PLANNED INTERVENTIONS: Therapeutic exercises, Therapeutic activity, Neuromuscular re-education, Balance training, Gait training, Patient/Family education, Self Care, Joint mobilization, Dry Needling, Biofeedback, and Manual therapy   PLAN FOR NEXT SESSION: Check on urge suppression technique; progress strengthening. Re-evaluation next session.      Heather Roberts, PT, DPT02/14/2412:33 PM

## 2023-02-10 ENCOUNTER — Encounter: Payer: Self-pay | Admitting: *Deleted

## 2023-02-11 ENCOUNTER — Ambulatory Visit: Payer: Medicare Other

## 2023-02-11 DIAGNOSIS — R293 Abnormal posture: Secondary | ICD-10-CM

## 2023-02-11 DIAGNOSIS — M25552 Pain in left hip: Secondary | ICD-10-CM | POA: Diagnosis not present

## 2023-02-11 DIAGNOSIS — M6281 Muscle weakness (generalized): Secondary | ICD-10-CM

## 2023-02-11 DIAGNOSIS — M62838 Other muscle spasm: Secondary | ICD-10-CM | POA: Diagnosis not present

## 2023-02-11 DIAGNOSIS — R279 Unspecified lack of coordination: Secondary | ICD-10-CM

## 2023-02-11 NOTE — Therapy (Signed)
OUTPATIENT PHYSICAL THERAPY TREATMENT NOTE   Patient Name: Mackenzie Green MRN: [id number] DOB:October 04, 1946, 77 y.o., female Today's Date: 02/11/2023  PCP: Aretta Nip, MD REFERRING PROVIDER: Faustino Congress, NP  END OF SESSION:   PT End of Session - 02/11/23 1401     Visit Number 9    Date for PT Re-Evaluation 04/22/23    Authorization Type [employer]    PT Start Time 1400    PT Stop Time 1440    PT Time Calculation (min) 40 min    Activity Tolerance Patient tolerated treatment well    Behavior During Therapy WFL for tasks assessed/performed                  Past Medical History:  Diagnosis Date   Low iron    Mild cognitive impairment with memory loss    Mitral valve prolapse    Past Surgical History:  Procedure Laterality Date   TEAR DUCT PROBING     TOTAL HIP ARTHROPLASTY Left 03/27/2022   Procedure: LEFT POSTERIOR TOTAL HIP ARTHROPLASTY;  Surgeon: Willaim Sheng, MD;  Location: [place];  Service: Orthopedics;  Laterality: Left;   Patient Active Problem List   Diagnosis Date Noted   Hip fracture ([place]) 03/26/2022   Mild cognitive impairment with memory loss 03/26/2022   Depression with anxiety 03/26/2022   Spinal stenosis at L4-L5 level 03/26/2022   Neutropenia ([place]) 08/27/2016   Weight loss 07/31/2012   Hypokalemia 07/31/2012   Hyponatremia 07/30/2012   Hypoglycemia 07/30/2012    REFERRING DIAG: N81.10 (ICD-10-CM) - Cystocele, unspecified R15.9 (ICD-10-CM) - Full incontinence of feces  THERAPY DIAG:  Abnormal posture  Muscle weakness (generalized)  Unspecified lack of coordination  Pain in left hip  Rationale for Evaluation and Treatment Rehabilitation  PERTINENT HISTORY: Lichens sclerosis, cystocele, hemorrhoids, diarrhea, 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 She had 4 episodes of fecal incontinence today alone and feels like it is no different. She thinks that urinary urgency and leaking is worse.    PAIN:  Are you having pain? Yes: NPRS scale: 6/10 Pain location: Lt hip Pain description: stiff Aggravating factors: walking - unrelenting pain, standing Relieving factors: pain medication to some extent   11/19/22 SUBJECTIVE STATEMENT: Pt states that she  has had issues with diarrhea and fecal incontinence; this is better after getting a pessary about a month and a half ago. She was having vaginal bleeding and was told this was from bladder prolapse; she has not had this since pessary was placed. She also had series of UTIs and a yeast infection. She is working on trying to build bone density right now due to fall and Lt hip replacement. She has been doing pelvic floor contractions 4x/day in various positions until MD told her her muscles were too far gone to do any good, therefore she stopped. She states that she is a little overwhelmed with all of her exercises for bones/muscles. She does also go to the pool. She is active and motivated. She is having consistent yellow/brown discharge since wearing pessary - she has talked with MD about it. Her pessary was placed and will remain in place until her 3 month appointment.  Fluid intake: Yes: not discussed in detail     PAIN:  Are you having pain? Yes NPRS scale: no provided this session Pain location:  Lt hip pain   Pain type: aching Pain description: intermittent    Aggravating factors: walking Relieving factors: rest   PRECAUTIONS: None   WEIGHT BEARING RESTRICTIONS: No   FALLS:  Has patient fallen in last 6 months? No   LIVING ENVIRONMENT: Lives with: lives with their family Lives in:  House/apartment     OCCUPATION: retired   PLOF: Independent   PATIENT GOALS: to strengthen pelvic floor    PERTINENT HISTORY:  Lichens sclerosis, cystocele, hemorrhoids, diarrhea, Lt THA 04/25/22, spinal stenosis L4-5, osteopenia/osteoporosis Sexual abuse: No   BOWEL MOVEMENT: Pain with bowel movement: No Type of bowel movement:Frequency sometimes multiple times a day, sometimes just once and Strain No Fully empty rectum: Yes: fees empty, but then she still has to go Leakage: No - not in the last several weeks besides occasional leakage - at time of referral she was having bowel movement leakage outside of control every time she urinated  Pads: Yes: - Fiber supplement: No *uses wash cloth to clean   URINATION: Pain with urination: No Fully empty bladder: Yes: she states that she tries to Stream: Strong Urgency: Yes: does not hold urine well Frequency: 2x/night; every hour - unchanged since she got pessary Leakage: Urge to void and Walking to the bathroom Pads: Yes: goes through several a day, mostly for discharge    INTERCOURSE: Pain with intercourse:  not sexually active currently; history of pain since menopause     PREGNANCY: Vaginal deliveries 2 Tearing Yes: forceps delivery ; significant tearing     PROLAPSE: Cystocele had to press bladder back inside vaginal canal prior to pessary     OBJECTIVE:  02/11/23: Palpation: severe tenderness, muscle spasm/trigger points, scar tissue restriction over Lt hip  Balance: unable to stand on Lt LE Hip strength:  Rt: grossly 5/5 Lt: flexion 4/5, adduction 3/5, abduction 2/5 and pain, IR 3/5 and pain, ER 3/5 and pain  Pelvic floor strength: (Rectal exam) Pelvic floor strength 3/5 Pelvic floor endurance 22 seconds Active lichens sclerosis plaques present around anus with skin irritation  01/07/23: Grade 3 anterior vaginal wall laxity without pessary Pelvic floor strength 2/5, external anal sphincter 2/5 Difficulty with  appropriate bearing down/bulge - able to perform balloon breathing with cuing  11/19/22:  COGNITION: Overall cognitive status: Within functional limits for tasks assessed  SENSATION: Light touch: Appears intact Proprioception: Appears intact   MUSCLE LENGTH:     FUNCTIONAL TESTS:      GAIT: Lt trendelenburg   POSTURE: rounded shoulders, forward head, decreased lumbar lordosis, increased thoracic kyphosis, and posterior pelvic tilt   LUMBARAROM/PROM:     LOWER EXTREMITY MMT:     PALPATION:   General  mild abdominal doming with increased abdominal pressure                 External Perineal Exam: palor in tissues, fusion of labia minora with labia majora, bright red urethral tissue, red irritated issue in vestibule                             Internal Pelvic Floor no tenderness, pessary present   Patient confirms identification and approves PT to assess internal pelvic floor and treatment Yes   PELVIC MMT:   MMT eval  Vaginal 2/5, endurance 3 seconds, repeat contractions 4x  Internal Anal Sphincter    External Anal Sphincter    Puborectalis    Diastasis Recti WNL  (Blank rows = not tested)         TONE: low   PROLAPSE: Pessary present, but anterior vaginal wall laxity still noted with bear down, no rectocele appreciated with pessary   TODAY'S TREATMENT 02/11/23 Manual: Trigger Point Dry-Needling  Treatment instructions: Expect mild to moderate muscle soreness. S/S of pneumothorax if dry needled over a lung field, and to seek immediate medical attention should they occur. Patient verbalized understanding of these instructions and education.  Patient Consent Given: Yes Education handout provided: Yes Muscles treated: Lt TFL and glutes Electrical stimulation performed: No Parameters: N/A Treatment response/outcome: twitch response and improved tension  Soft tissue mobilization Lt hip  Therapeutic activities: Peri bottle, gentle  cleansing HEP to focus on during time of pain exacerbation Reviewed strict bowel training - emphasized importance of schedule, not remaining in bathroom for long periods of time, and going after each meal, not just breakfast   TREATMENT 02/05/23 Manual: Gentle soft tissue mobilization to Lt hip Exercises: Supine hip adduction 2 x 10 Supine straight leg raise with core activation 2 x 10 Supine march 3 x 10   TREATMENT 01/21/23 Manual: Trigger Point Dry-Needling  Treatment instructions: Expect mild to moderate muscle soreness. S/S of pneumothorax if dry needled over a lung field, and to seek immediate medical attention should they occur. Patient verbalized understanding of these instructions and education.  Patient Consent Given: Yes Education handout provided: Yes Muscles treated: Lt TFL and glutes Electrical stimulation performed: No Parameters: N/A Treatment response/outcome: twitch response and improved tension  Soft tissue mobilization Lt hip/Lt thigh Exercises: Heel raises 2 x 10 Standing march 2 x 10  PATIENT EDUCATION:  Education details: see above Person educated: Patient Education method: Consulting civil engineer, Demonstration, Tactile cues, Verbal cues, and Handouts Education comprehension: verbalized understanding   HOME EXERCISE PROGRAM: Access Code: [email or web address]     ASSESSMENT:   CLINICAL IMPRESSION: Pt has seen large exacerbation of Lt hip pain and bladder symptoms; fecal incontinence is unchanged. Bladder symptoms may be worsening due to not being able to wear pessary at this time from vaginal irritation that needed to heal. Fecal incontinence seemed to be improving, but has worsened with pain exacerbation/increased restriction in Lt hip. Believe this is due to relationship between pelvic floor muscles and bigger hip muscles; this is also why her fecal incontinence started after fall/hip surgery. Internal  rectal exam demonstrated good improvement in strength and endurance  today, but this is likely not translating to improvements functionally due to large amounts of pain she's in in addition to lack of motor control in more functional movements. She will continue to benefit from skilled PT intervention in order to decrease Lt hip pain, improve urinary/bowel symptoms, perform pelvic floor strengthening program, and incorporate pelvic floor into functional strengthening with appropriate pressure management.    OBJECTIVE IMPAIRMENTS: Abnormal gait, decreased activity tolerance, decreased coordination, decreased endurance, decreased strength, increased fascial restrictions, increased muscle spasms, impaired tone, postural dysfunction, and pain.    ACTIVITY LIMITATIONS: continence   PARTICIPATION LIMITATIONS: community activity   PERSONAL FACTORS: 3+ comorbidities: Lichens sclerosis, cystocele, hemorrhoids, diarrhea, Lt THA 04/25/22, spinal stenosis L4-5, osteopenia/osteoporosis  are also affecting patient's functional outcome.    REHAB POTENTIAL: Good   CLINICAL DECISION MAKING: Stable/uncomplicated   EVALUATION COMPLEXITY: Low     GOALS: Goals reviewed with patient? Yes   SHORT TERM GOALS: Target date: 12/17/22 - updated 12/25/22 - updated 02/05/23 new goal 04/22/2023    Pt will be independent with HEP.    Baseline: Goal status: MET 02/11/23   2.  Pt will be able to correctly perform diaphragmatic breathing and appropriate pressure management in order to prevent worsening vaginal wall laxity and improve pelvic floor A/ROM.    Baseline:  Goal status: Met 02/11/23   3.  Pt will be independent with the knack, urge suppression technique, and double voiding in order to improve bladder habits and decrease urinary/fecal incontinence.    Baseline:  Goal status: IN PROGRESS     LONG TERM GOALS: Target date: 01/28/22 - updated 12/25/22 - updated 02/05/23 new goal 04/22/2023    Pt will be independent with advanced HEP.    Baseline:  Goal status: IN PROGRESS   2.  Pt  will demonstrate normal pelvic floor muscle tone and A/ROM, able to achieve 3/5 strength with contractions and 10 sec endurance, in order to provide appropriate lumbopelvic support in functional activities.    Baseline:  Goal status: MET 02/11/23   3.  Pt will report improved sensation of control over urgency and continence with bowel movements without any episodes of fecal incontinence.  Baseline:  Goal status: IIN PROGRESS   4.  Pt will be able to go 2-3 hours in between voids without urgency or incontinence in order to improve QOL and perform all functional activities with less difficulty.    Baseline:  Goal status: IN PROGRESS  5. Pt will demonstrate normal pelvic floor muscle tone and A/ROM, able to achieve 4/5 strength with contractions and 30 sec endurance, in order to provide appropriate lumbopelvic support in functional activities.    Baseline:  Goal status: INITIAL  6. Pt will report no higher Lt hip pain during functional activities than 2/10 and be able to stand/walk for greater than 30 minutes without difficulty.    Baseline:  Goal status: INITIAL     PLAN:   PT FREQUENCY: 1x/week   PT DURATION: 10 weeks   PLANNED INTERVENTIONS: Therapeutic exercises, Therapeutic activity, Neuromuscular re-education, Balance training, Gait training, Patient/Family education, Self Care, Joint mobilization, Dry Needling, Biofeedback, and Manual therapy   PLAN FOR NEXT SESSION: Revisit bowel retraining for correct performance; manual techniques as needed; functional activity training with pelvic floor.      Heather Roberts, PT, DPT02/20/242:49 PM

## 2023-02-17 DIAGNOSIS — M7062 Trochanteric bursitis, left hip: Secondary | ICD-10-CM | POA: Diagnosis not present

## 2023-02-18 ENCOUNTER — Ambulatory Visit: Payer: Medicare Other

## 2023-02-18 DIAGNOSIS — M62838 Other muscle spasm: Secondary | ICD-10-CM | POA: Diagnosis not present

## 2023-02-18 DIAGNOSIS — M25552 Pain in left hip: Secondary | ICD-10-CM | POA: Diagnosis not present

## 2023-02-18 DIAGNOSIS — R293 Abnormal posture: Secondary | ICD-10-CM | POA: Diagnosis not present

## 2023-02-18 DIAGNOSIS — M6281 Muscle weakness (generalized): Secondary | ICD-10-CM | POA: Diagnosis not present

## 2023-02-18 DIAGNOSIS — R279 Unspecified lack of coordination: Secondary | ICD-10-CM

## 2023-02-18 NOTE — Patient Instructions (Addendum)
Bowel retraining: attempt to go within 30 minutes after each meal  2. When you have urinary urgency:  -5 quick flicks  -deep breath  -calmly go to the bathroom  *Repeat as needed  3. Go to the bathroom to urinate every 2 hours - whether you need to or not. Use the above drill to help make this 2 hour goal. If you start feeling like you're going to leak, go ahead and go to the bathroom, and then use this urge drill to help you get there without leaking. Once you go to the bathroom, start the 2 hour timer again.   4. Double voiding: urinate while relaxing forward with forearms on thighs; once done, sit up and wiggle around a little, then lean back forward to see if any more urine will come out.   5. Smaller, more frequent sips of water.   6. Stop drinking anything 3 hours before bed.   Pelvic floor strengthening program: -3x/day -10 contractions, hold each 10 seconds   [facility] [address] [place],  [address] Phone # [phone] Fax [phone]

## 2023-02-18 NOTE — Therapy (Signed)
OUTPATIENT PHYSICAL THERAPY TREATMENT NOTE   Patient Name: Mackenzie Green MRN: [id number] DOB:1946/09/05, 77 y.o., female Today's Date: 02/18/2023  PCP: Aretta Nip, MD REFERRING PROVIDER: Faustino Congress, NP  END OF SESSION:   PT End of Session - 02/18/23 1402     Visit Number 10    Date for PT Re-Evaluation 04/22/23    Authorization Type [employer]    PT Start Time 1400    PT Stop Time 1505    PT Time Calculation (min) 65 min    Activity Tolerance Patient tolerated treatment well    Behavior During Therapy WFL for tasks assessed/performed                  Past Medical History:  Diagnosis Date   Low iron    Mild cognitive impairment with memory loss    Mitral valve prolapse    Past Surgical History:  Procedure Laterality Date   TEAR DUCT PROBING     TOTAL HIP ARTHROPLASTY Left 03/27/2022   Procedure: LEFT POSTERIOR TOTAL HIP ARTHROPLASTY;  Surgeon: Willaim Sheng, MD;  Location: [facility];  Service: Orthopedics;  Laterality: Left;   Patient Active Problem List   Diagnosis Date Noted   Hip fracture ([place]) 03/26/2022   Mild cognitive impairment with memory loss 03/26/2022   Depression with anxiety 03/26/2022   Spinal stenosis at L4-L5 level 03/26/2022   Neutropenia ([place]) 08/27/2016   Weight loss 07/31/2012   Hypokalemia 07/31/2012   Hyponatremia 07/30/2012   Hypoglycemia 07/30/2012    REFERRING DIAG: N81.10 (ICD-10-CM) - Cystocele, unspecified R15.9 (ICD-10-CM) - Full incontinence of feces  THERAPY DIAG:  Abnormal posture  Muscle weakness (generalized)  Unspecified lack of coordination  Pain in left hip  Other muscle spasm  Rationale for Evaluation and Treatment Rehabilitation  PERTINENT HISTORY: Lichens sclerosis, cystocele, hemorrhoids, diarrhea, 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 She had 2 bowel movements in the middle of the night last night and did not have any sensation of this happening. She had a couple of good days with Lt hip pain after dry needling and then massage two days later.   PAIN:  Are you having pain? Yes: NPRS scale: 6/10 Pain location: Lt hip Pain description: stiff Aggravating factors: walking - unrelenting pain, standing Relieving factors: pain medication to some extent  11/19/22 SUBJECTIVE STATEMENT: Pt states that she has had issues with diarrhea and fecal incontinence; this is better after getting a pessary about a month and a half ago. She was having vaginal bleeding and was told this was from bladder prolapse; she has not had this since pessary was placed. She also had series of UTIs and a yeast infection. She is working on trying to build bone density right now due to fall and Lt hip replacement. She has been doing pelvic floor contractions 4x/day in various positions until MD told her her muscles were too far gone to do any good, therefore she stopped. She states that she is a little overwhelmed with all of her exercises for bones/muscles. She does also go to the pool. She is active and motivated. She is having consistent yellow/brown discharge since wearing pessary - she has talked with MD about it. Her pessary was placed and will remain in place until her 3 month appointment.  Fluid intake: Yes: not discussed in detail     PAIN:  Are you having pain? Yes NPRS scale: no provided this session Pain location:  Lt hip pain   Pain type: aching Pain description: intermittent    Aggravating factors: walking Relieving factors: rest   PRECAUTIONS: None   WEIGHT BEARING RESTRICTIONS: No   FALLS:  Has patient fallen in last 6 months? No   LIVING ENVIRONMENT: Lives with: lives  with their family Lives in: House/apartment     OCCUPATION: retired   PLOF: Independent   PATIENT GOALS: to strengthen pelvic floor    PERTINENT HISTORY:  Lichens sclerosis, cystocele, hemorrhoids, diarrhea, Lt THA 04/25/22, spinal stenosis L4-5, osteopenia/osteoporosis Sexual abuse: No   BOWEL MOVEMENT: Pain with bowel movement: No Type of bowel movement:Frequency sometimes multiple times a day, sometimes just once and Strain No Fully empty rectum: Yes: fees empty, but then she still has to go Leakage: No - not in the last several weeks besides occasional leakage - at time of referral she was having bowel movement leakage outside of control every time she urinated  Pads: Yes: - Fiber supplement: No *uses wash cloth to clean   URINATION: Pain with urination: No Fully empty bladder: Yes: she states that she tries to Stream: Strong Urgency: Yes: does not hold urine well Frequency: 2x/night; every hour - unchanged since she got pessary Leakage: Urge to void and Walking to the bathroom Pads: Yes: goes through several a day, mostly for discharge    INTERCOURSE: Pain with intercourse:  not sexually active currently; history of pain since menopause     PREGNANCY: Vaginal deliveries 2 Tearing Yes: forceps delivery ; significant tearing     PROLAPSE: Cystocele had to press bladder back inside vaginal canal prior to pessary     OBJECTIVE:  02/11/23: Palpation: severe tenderness, muscle spasm/trigger points, scar tissue restriction over Lt hip  Balance: unable to stand on Lt LE Hip strength:  Rt: grossly 5/5 Lt: flexion 4/5, adduction 3/5, abduction 2/5 and pain, IR 3/5 and pain, ER 3/5 and pain  Pelvic floor strength: (Rectal exam) Pelvic floor strength 3/5 Pelvic floor endurance 22 seconds Active lichens sclerosis plaques present around anus with skin irritation  01/07/23: Grade 3 anterior vaginal wall laxity without pessary Pelvic floor strength 2/5, external anal  sphincter 2/5 Difficulty with appropriate bearing down/bulge - able to perform balloon breathing with cuing  11/19/22:  COGNITION: Overall cognitive status: Within functional limits for tasks assessed  SENSATION: Light touch: Appears intact Proprioception: Appears intact   MUSCLE LENGTH:     FUNCTIONAL TESTS:      GAIT: Lt trendelenburg   POSTURE: rounded shoulders, forward head, decreased lumbar lordosis, increased thoracic kyphosis, and posterior pelvic tilt   LUMBARAROM/PROM:     LOWER EXTREMITY MMT:     PALPATION:   General  mild abdominal doming with increased abdominal pressure                 External Perineal Exam: palor in tissues, fusion of labia minora with labia majora, bright red urethral tissue, red irritated issue in vestibule                             Internal Pelvic Floor no tenderness, pessary present   Patient confirms identification and approves PT to assess internal pelvic floor and treatment Yes   PELVIC MMT:   MMT eval  Vaginal 2/5, endurance 3 seconds, repeat contractions 4x  Internal Anal Sphincter    External Anal Sphincter    Puborectalis    Diastasis Recti WNL  (Blank rows = not tested)         TONE: low   PROLAPSE: Pessary present, but anterior vaginal wall laxity still noted with bear down, no rectocele appreciated with pessary   TODAY'S TREATMENT 02/18/23: Manual: Trigger Point Dry-Needling  Treatment instructions: Expect mild to moderate muscle soreness. S/S of pneumothorax if dry needled over a lung field, and to seek immediate medical attention should they occur. Patient verbalized understanding of these instructions and education.  Patient Consent Given: Yes Education handout provided: Yes Muscles treated: Lt TFL, vastus lateralis, adductors, and glutes Electrical stimulation performed: No Parameters: N/A Treatment response/outcome: twitch response and improved tension Soft tissue mobilization  Lt hip Negative pressure soft tissue mobilization to Lt hip Therapeutic activities: Review of strict bowel/bladder retraining - strongly encouraged to adhere to after meal voiding attempts Encouraged that it sounds like fiber is helping due to stopping fiber and increase in symptoms Review/modification to pelvic floor strengthening program, focusing on long holds since she is going to be performing urge drill consistently throughout the day with bladder retraining Urge drill review Double voiding   TREATMENT 02/11/23 Manual: Trigger Point Dry-Needling  Treatment instructions: Expect mild to moderate muscle soreness. S/S of pneumothorax if dry needled over a lung field, and to seek immediate medical attention should they occur. Patient verbalized understanding of these instructions and education.  Patient Consent Given: Yes Education handout provided: Yes Muscles treated: Lt TFL and glutes Electrical stimulation performed: No Parameters: N/A Treatment response/outcome: twitch response and improved tension  Soft tissue mobilization Lt hip  Therapeutic activities: Peri bottle, gentle cleansing HEP to focus on during time of pain exacerbation Reviewed strict bowel training - emphasized importance of schedule, not remaining in bathroom for long periods of time, and going after each meal, not just breakfast   TREATMENT 02/05/23 Manual: Gentle soft tissue mobilization to Lt hip Exercises: Supine hip adduction 2 x 10 Supine straight leg raise with core activation 2 x 10 Supine march 3 x 10  PATIENT EDUCATION:  Education details: see above Person educated: Patient Education method: Consulting civil engineer, Demonstration, Tactile cues, Verbal cues, and Handouts Education comprehension: verbalized understanding   HOME EXERCISE PROGRAM: Access Code: [email or web address]     ASSESSMENT:   CLINICAL IMPRESSION: Reviewed strict bowel/bladder retraining and she was strongly encouraged that this can be  helpful if she can stick  to schedule. Significant time spent going over how to specifically incorporate timed voiding for both bowel and bladder and what the urge drill looks like to help with this. She will benefit form attempting to more completely empty her bladder, which double voiding and relaxed toilet mechanics should help with. Believe that decrease in Lt hip pain will also help improve all of these symptoms in addition to getting pessary placed again next week. We reviewed/modified HEP for pelvic floor strengthening. Patient was able to teach back these principles and feels confident with written instructions to begin incorporating regularly.  Good tolerance to DN and manual techniques with decreased pain upon ambulation. She will continue to benefit from skilled PT intervention in order to decrease Lt hip pain, improve urinary/bowel symptoms, perform pelvic floor strengthening program, and incorporate pelvic floor into functional strengthening with appropriate pressure management.    OBJECTIVE IMPAIRMENTS: Abnormal gait, decreased activity tolerance, decreased coordination, decreased endurance, decreased strength, increased fascial restrictions, increased muscle spasms, impaired tone, postural dysfunction, and pain.    ACTIVITY LIMITATIONS: continence   PARTICIPATION LIMITATIONS: community activity   PERSONAL FACTORS: 3+ comorbidities: Lichens sclerosis, cystocele, hemorrhoids, diarrhea, Lt THA 04/25/22, spinal stenosis L4-5, osteopenia/osteoporosis  are also affecting patient's functional outcome.    REHAB POTENTIAL: Good   CLINICAL DECISION MAKING: Stable/uncomplicated   EVALUATION COMPLEXITY: Low     GOALS: Goals reviewed with patient? Yes   SHORT TERM GOALS: Target date: 12/17/22 - updated 12/25/22 - updated 02/05/23 new goal 04/22/2023    Pt will be independent with HEP.    Baseline: Goal status: MET 02/11/23   2.  Pt will be able to correctly perform diaphragmatic breathing and  appropriate pressure management in order to prevent worsening vaginal wall laxity and improve pelvic floor A/ROM.    Baseline:  Goal status: Met 02/11/23   3.  Pt will be independent with the knack, urge suppression technique, and double voiding in order to improve bladder habits and decrease urinary/fecal incontinence.    Baseline:  Goal status: IN PROGRESS     LONG TERM GOALS: Target date: 01/28/22 - updated 12/25/22 - updated 02/05/23 new goal 04/22/2023    Pt will be independent with advanced HEP.    Baseline:  Goal status: IN PROGRESS   2.  Pt will demonstrate normal pelvic floor muscle tone and A/ROM, able to achieve 3/5 strength with contractions and 10 sec endurance, in order to provide appropriate lumbopelvic support in functional activities.    Baseline:  Goal status: MET 02/11/23   3.  Pt will report improved sensation of control over urgency and continence with bowel movements without any episodes of fecal incontinence.  Baseline:  Goal status: IIN PROGRESS   4.  Pt will be able to go 2-3 hours in between voids without urgency or incontinence in order to improve QOL and perform all functional activities with less difficulty.    Baseline:  Goal status: IN PROGRESS  5. Pt will demonstrate normal pelvic floor muscle tone and A/ROM, able to achieve 4/5 strength with contractions and 30 sec endurance, in order to provide appropriate lumbopelvic support in functional activities.    Baseline:  Goal status: INITIAL  6. Pt will report no higher Lt hip pain during functional activities than 2/10 and be able to stand/walk for greater than 30 minutes without difficulty.    Baseline:  Goal status: INITIAL     PLAN:   PT FREQUENCY: 1x/week   PT DURATION: 10 weeks   PLANNED INTERVENTIONS: Therapeutic  exercises, Therapeutic activity, Neuromuscular re-education, Balance training, Gait training, Patient/Family education, Self Care, Joint mobilization, Dry Needling, Biofeedback, and  Manual therapy   PLAN FOR NEXT SESSION: Revisit bowel retraining for correct performance; manual techniques as needed; functional activity training with pelvic floor.      Heather Roberts, PT, DPT02/27/243:23 PM

## 2023-02-20 DIAGNOSIS — M7062 Trochanteric bursitis, left hip: Secondary | ICD-10-CM | POA: Diagnosis not present

## 2023-02-24 ENCOUNTER — Ambulatory Visit: Payer: Medicare Other | Admitting: Obstetrics and Gynecology

## 2023-02-24 ENCOUNTER — Encounter: Payer: Self-pay | Admitting: Obstetrics and Gynecology

## 2023-02-24 VITALS — BP 122/76 | HR 73

## 2023-02-24 DIAGNOSIS — N812 Incomplete uterovaginal prolapse: Secondary | ICD-10-CM

## 2023-02-24 DIAGNOSIS — L900 Lichen sclerosus et atrophicus: Secondary | ICD-10-CM

## 2023-02-24 DIAGNOSIS — N8110 Cystocele, unspecified: Secondary | ICD-10-CM

## 2023-02-24 NOTE — Progress Notes (Addendum)
[facility]   Subjective:     Chief Complaint: Follow-up Mackenzie Green is a 77 y.o. female is here for medication follow up.)  History of Present Illness: Mackenzie Green is a 77 y.o. female with stage III pelvic organ prolapse and stress incontinence who presents today for a pessary fitting.    Past Medical History: Patient  has a past medical history of Low iron, Mild cognitive impairment with memory loss, and Mitral valve prolapse.   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   Medications: She has a current medication list which includes the following prescription(s): biotin, calcium, cephalexin, cholecalciferol, cholestyramine, clobetasol prop emollient base, ferrous sulfate, iron, fluconazole, fluzone high-dose quadrivalent, folic acid, magnesium citrate, vitamin k2, multivitamin adult, ascorbic acid, and vitamin k.   Allergies: Patient is allergic to dairy aid [tilactase].   Social History: Patient  reports that she has never smoked. She has never used smokeless tobacco. She reports current alcohol use. She reports that she does not use drugs.      Objective:    BP 122/76   Pulse 73  Gen: No apparent distress, A&O x 3. Pelvic Exam: Normal external female genitalia; Bartholin's and Skene's glands normal in appearance; urethral meatus with caruncle, no urethral masses or discharge.   Attempted a #2 donut which was expelled with hard coughing Attempted a #3 donut that did not fit well in patient Attempted a #3 cube that was too large for patient's introitus  Attempted a #3 dish with knob that was uncomfortable to patient  A size #2 short stem gellhorn pessary (Lot [id number]) was fitted. It was comfortable, stayed in place with valsalva and was an appropriate size on examination, with one finger fitting between the pessary and the vaginal walls. She attempted to remove  pessary and was unable to do so with support and encouragement. She was able to reinsert with coaching but was unable to re-insert. Encouraged her to talk to her PT to support her in removal and re-insertion.   Assessment/Plan:    Assessment: Mackenzie Green is a 77 y.o. with stage III pelvic organ prolapse and stress incontinence who presents for a pessary fitting. Plan: She was fitted with a #2  short stem gellhorn pessary. She will keep the pessary in place until next visit. She will use estrogen.   She reports no real change in her Lichen's Sclerosus but states she never had symptoms before.   Follow-up in 3 months for a pessary check or sooner as needed.  All questions were answered.   Berton Mount, NP

## 2023-02-24 NOTE — Patient Instructions (Signed)
You can continue to try to remove the pessary but do not hurt yourself.   Also mention to PT.

## 2023-02-25 ENCOUNTER — Ambulatory Visit: Payer: Medicare Other | Admitting: Obstetrics and Gynecology

## 2023-02-25 ENCOUNTER — Ambulatory Visit: Payer: Medicare Other | Attending: Family Medicine

## 2023-02-25 DIAGNOSIS — M7062 Trochanteric bursitis, left hip: Secondary | ICD-10-CM | POA: Diagnosis not present

## 2023-02-25 DIAGNOSIS — R279 Unspecified lack of coordination: Secondary | ICD-10-CM | POA: Diagnosis not present

## 2023-02-25 DIAGNOSIS — M6281 Muscle weakness (generalized): Secondary | ICD-10-CM | POA: Insufficient documentation

## 2023-02-25 DIAGNOSIS — M25552 Pain in left hip: Secondary | ICD-10-CM | POA: Diagnosis not present

## 2023-02-25 DIAGNOSIS — M62838 Other muscle spasm: Secondary | ICD-10-CM | POA: Insufficient documentation

## 2023-02-25 DIAGNOSIS — R293 Abnormal posture: Secondary | ICD-10-CM | POA: Diagnosis not present

## 2023-02-25 NOTE — Therapy (Signed)
OUTPATIENT PHYSICAL THERAPY TREATMENT NOTE   Patient Name: Mackenzie Green MRN: [id number] DOB:29-Oct-1946, 77 y.o., female Today's Date: 02/25/2023  PCP: Aretta Nip, MD REFERRING PROVIDER: Faustino Congress, NP  END OF SESSION:   PT End of Session - 02/25/23 1616     Visit Number 11    Date for PT Re-Evaluation 04/22/23    Authorization Type [employer]    PT Start Time 1615    PT Stop Time 1655    PT Time Calculation (min) 40 min    Activity Tolerance Patient tolerated treatment well    Behavior During Therapy WFL for tasks assessed/performed                  Past Medical History:  Diagnosis Date   Low iron    Mild cognitive impairment with memory loss    Mitral valve prolapse    Past Surgical History:  Procedure Laterality Date   TEAR DUCT PROBING     TOTAL HIP ARTHROPLASTY Left 03/27/2022   Procedure: LEFT POSTERIOR TOTAL HIP ARTHROPLASTY;  Surgeon: Willaim Sheng, MD;  Location: [facility];  Service: Orthopedics;  Laterality: Left;   Patient Active Problem List   Diagnosis Date Noted   Hip fracture ([place]) 03/26/2022   Mild cognitive impairment with memory loss 03/26/2022   Depression with anxiety 03/26/2022   Spinal stenosis at L4-L5 level 03/26/2022   Neutropenia ([place]) 08/27/2016   Weight loss 07/31/2012   Hypokalemia 07/31/2012   Hyponatremia 07/30/2012   Hypoglycemia 07/30/2012    REFERRING DIAG: N81.10 (ICD-10-CM) - Cystocele, unspecified R15.9 (ICD-10-CM) - Full incontinence of feces  THERAPY DIAG:  Abnormal posture  Muscle weakness (generalized)  Unspecified lack of coordination  Pain in left hip  Other muscle spasm  Rationale for Evaluation and Treatment Rehabilitation  PERTINENT HISTORY: Lichens sclerosis, cystocele, hemorrhoids, diarrhea, Lt THA 04/25/22, spinal stenosis L4-5, osteopenia/osteoporosis  PRECAUTIONS: NA  SUBJECTIVE:                                                                                                                                                                                       SUBJECTIVE STATEMENT: Pt states that she has done well with bladder retraining, but notices that it is worse in the morning when she has coffee. She had terrible episode of diarrhea and was unable to control. She did have an appointment to work on finding a pessary that fits, but had a very difficulty time getting any pessary to stay in. She goes back on Friday.    PAIN:  Are you having pain? Yes: NPRS scale: 3/10 Pain location: Lt hip Pain description: stiff Aggravating factors: walking -  unrelenting pain, standing Relieving factors: pain medication to some extent   11/19/22 SUBJECTIVE STATEMENT: Pt states that she has had issues with diarrhea and fecal incontinence; this is better after getting a pessary about a month and a half ago. She was having vaginal bleeding and was told this was from bladder prolapse; she has not had this since pessary was placed. She also had series of UTIs and a yeast infection. She is working on trying to build bone density right now due to fall and Lt hip replacement. She has been doing pelvic floor contractions 4x/day in various positions until MD told her her muscles were too far gone to do any good, therefore she stopped. She states that she is a little overwhelmed with all of her exercises for bones/muscles. She does also go to the pool. She is active and motivated. She is having consistent yellow/brown discharge since wearing pessary - she has talked with MD about it. Her pessary was placed and will remain in place until her 3 month appointment.  Fluid intake: Yes: not discussed in detail     PAIN:  Are you having pain? Yes NPRS scale: no provided this session Pain location:  Lt hip pain   Pain type: aching Pain description: intermittent    Aggravating factors: walking Relieving factors: rest   PRECAUTIONS: None   WEIGHT BEARING RESTRICTIONS: No   FALLS:  Has  patient fallen in last 6 months? 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/25/22, spinal stenosis L4-5, osteopenia/osteoporosis Sexual abuse: No   BOWEL MOVEMENT: Pain with bowel movement: No Type of bowel movement:Frequency sometimes multiple times a day, sometimes just once and Strain No Fully empty rectum: Yes: fees empty, but then she still has to go Leakage: No - not in the last several weeks besides occasional leakage - at time of referral she was having bowel movement leakage outside of control every time she urinated  Pads: Yes: - Fiber supplement: No *uses wash cloth to clean   URINATION: Pain with urination: No Fully empty bladder: Yes: she states that she tries to Stream: Strong Urgency: Yes: does not hold urine well Frequency: 2x/night; every hour - unchanged since she got pessary Leakage: Urge to void and Walking to the bathroom Pads: Yes: goes through several a day, mostly for discharge    INTERCOURSE: Pain with intercourse:  not sexually active currently; history of pain since menopause     PREGNANCY: Vaginal deliveries 2 Tearing Yes: forceps delivery ; significant tearing     PROLAPSE: Cystocele had to press bladder back inside vaginal canal prior to pessary     OBJECTIVE:  02/11/23: Palpation: severe tenderness, muscle spasm/trigger points, scar tissue restriction over Lt hip  Balance: unable to stand on Lt LE Hip strength:  Rt: grossly 5/5 Lt: flexion 4/5, adduction 3/5, abduction 2/5 and pain, IR 3/5 and pain, ER 3/5 and pain  Pelvic floor strength: (Rectal exam) Pelvic floor strength 3/5 Pelvic floor endurance 22 seconds Active lichens sclerosis plaques present around anus with skin irritation  01/07/23: Grade 3 anterior  vaginal wall laxity without pessary Pelvic floor strength 2/5, external anal sphincter 2/5 Difficulty with appropriate bearing down/bulge - able to perform balloon breathing with cuing  11/19/22:  COGNITION: Overall cognitive status: Within functional limits  for tasks assessed                          SENSATION: Light touch: Appears intact Proprioception: Appears intact   MUSCLE LENGTH:     FUNCTIONAL TESTS:      GAIT: Lt trendelenburg   POSTURE: rounded shoulders, forward head, decreased lumbar lordosis, increased thoracic kyphosis, and posterior pelvic tilt   LUMBARAROM/PROM:     LOWER EXTREMITY MMT:     PALPATION:   General  mild abdominal doming with increased abdominal pressure                 External Perineal Exam: palor in tissues, fusion of labia minora with labia majora, bright red urethral tissue, red irritated issue in vestibule                             Internal Pelvic Floor no tenderness, pessary present   Patient confirms identification and approves PT to assess internal pelvic floor and treatment Yes   PELVIC MMT:   MMT eval  Vaginal 2/5, endurance 3 seconds, repeat contractions 4x  Internal Anal Sphincter    External Anal Sphincter    Puborectalis    Diastasis Recti WNL  (Blank rows = not tested)         TONE: low   PROLAPSE: Pessary present, but anterior vaginal wall laxity still noted with bear down, no rectocele appreciated with pessary   TODAY'S TREATMENT 02/25/23 Manual: Trigger Point Dry-Needling  Treatment instructions: Expect mild to moderate muscle soreness. S/S of pneumothorax if dry needled over a lung field, and to seek immediate medical attention should they occur. Patient verbalized understanding of these instructions and education.  Patient Consent Given: Yes Education handout provided: Yes Muscles treated: Lt TFL, vastus lateralis, adductors, and glutes Electrical stimulation performed: No Parameters: N/A Treatment  response/outcome: twitch response and improved tension Instrument assisted soft tissue mobilization Lt hip  Neuromuscular re-education: Pelvic floor contraction training focusing on external anal sphincter and superficial pelvic floor Bulge training to help with relaxation in taking pessary out    TREATMENT 02/18/23: Manual: Trigger Point Dry-Needling  Treatment instructions: Expect mild to moderate muscle soreness. S/S of pneumothorax if dry needled over a lung field, and to seek immediate medical attention should they occur. Patient verbalized understanding of these instructions and education.  Patient Consent Given: Yes Education handout provided: Yes Muscles treated: Lt TFL, vastus lateralis, adductors, and glutes Electrical stimulation performed: No Parameters: N/A Treatment response/outcome: twitch response and improved tension Soft tissue mobilization Lt hip Negative pressure soft tissue mobilization to Lt hip Therapeutic activities: Review of strict bowel/bladder retraining - strongly encouraged to adhere to after meal voiding attempts Encouraged that it sounds like fiber is helping due to stopping fiber and increase in symptoms Review/modification to pelvic floor strengthening program, focusing on long holds since she is going to be performing urge drill consistently throughout the day with bladder retraining Urge drill review Double voiding   TREATMENT 02/11/23 Manual: Trigger Point Dry-Needling  Treatment instructions: Expect mild to moderate muscle soreness. S/S of pneumothorax if dry needled over a lung field, and to seek immediate medical attention should they occur. Patient verbalized understanding of these instructions and education.  Patient Consent Given: Yes Education handout provided: Yes Muscles treated: Lt TFL and glutes Electrical stimulation performed: No Parameters: N/A Treatment response/outcome: twitch response and improved tension  Soft tissue  mobilization Lt  hip  Therapeutic activities: Peri bottle, gentle cleansing HEP to focus on during time of pain exacerbation Reviewed strict bowel training - emphasized importance of schedule, not remaining in bathroom for long periods of time, and going after each meal, not just breakfast    PATIENT EDUCATION:  Education details: see above Person educated: Patient Education method: Explanation, Demonstration, Tactile cues, Verbal cues, and Handouts Education comprehension: verbalized understanding   HOME EXERCISE PROGRAM: Access Code: [email or web address]     ASSESSMENT:   CLINICAL IMPRESSION: Pt does have a good pelvic floor bulge and believe that she was having difficulty getting pessary out due to extent of trial yesterday, not from inherent pelvic floor tone. Pelvic floor muscle weakness may have more to do with ability to keep pessary in than vaginal canal size. Left hip pain is improved today and further improved after manual techniques; she was able to ambulate with normal gait pattern after treatment today. Believe returning to core/pelvic floor strengthening progressions next treatment session will be very helpful for both pelvic floor dysfunction and Lt hip. She will continue to benefit from skilled PT intervention in order to decrease Lt hip pain, improve urinary/bowel symptoms, perform pelvic floor strengthening program, and incorporate pelvic floor into functional strengthening with appropriate pressure management.    OBJECTIVE IMPAIRMENTS: Abnormal gait, decreased activity tolerance, decreased coordination, decreased endurance, decreased strength, increased fascial restrictions, increased muscle spasms, impaired tone, postural dysfunction, and pain.    ACTIVITY LIMITATIONS: continence   PARTICIPATION LIMITATIONS: community activity   PERSONAL FACTORS: 3+ comorbidities: Lichens sclerosis, cystocele, hemorrhoids, diarrhea, Lt THA 04/25/22, spinal stenosis L4-5, osteopenia/osteoporosis   are also affecting patient's functional outcome.    REHAB POTENTIAL: Good   CLINICAL DECISION MAKING: Stable/uncomplicated   EVALUATION COMPLEXITY: Low     GOALS: Goals reviewed with patient? Yes   SHORT TERM GOALS: Target date: 12/17/22 - updated 12/25/22 - updated 02/05/23 new goal 04/22/2023    Pt will be independent with HEP.    Baseline: Goal status: MET 02/11/23   2.  Pt will be able to correctly perform diaphragmatic breathing and appropriate pressure management in order to prevent worsening vaginal wall laxity and improve pelvic floor A/ROM.    Baseline:  Goal status: Met 02/11/23   3.  Pt will be independent with the knack, urge suppression technique, and double voiding in order to improve bladder habits and decrease urinary/fecal incontinence.    Baseline:  Goal status: IN PROGRESS     LONG TERM GOALS: Target date: 01/28/22 - updated 12/25/22 - updated 02/05/23 new goal 04/22/2023    Pt will be independent with advanced HEP.    Baseline:  Goal status: IN PROGRESS   2.  Pt will demonstrate normal pelvic floor muscle tone and A/ROM, able to achieve 3/5 strength with contractions and 10 sec endurance, in order to provide appropriate lumbopelvic support in functional activities.    Baseline:  Goal status: MET 02/11/23   3.  Pt will report improved sensation of control over urgency and continence with bowel movements without any episodes of fecal incontinence.  Baseline:  Goal status: IIN PROGRESS   4.  Pt will be able to go 2-3 hours in between voids without urgency or incontinence in order to improve QOL and perform all functional activities with less difficulty.    Baseline:  Goal status: IN PROGRESS  5. Pt will demonstrate normal pelvic floor muscle tone and A/ROM, able to achieve 4/5 strength with contractions and 30 sec endurance, in order to provide  appropriate lumbopelvic support in functional activities.    Baseline:  Goal status: INITIAL  6. Pt will report no  higher Lt hip pain during functional activities than 2/10 and be able to stand/walk for greater than 30 minutes without difficulty.    Baseline:  Goal status: INITIAL     PLAN:   PT FREQUENCY: 1x/week   PT DURATION: 10 weeks   PLANNED INTERVENTIONS: Therapeutic exercises, Therapeutic activity, Neuromuscular re-education, Balance training, Gait training, Patient/Family education, Self Care, Joint mobilization, Dry Needling, Biofeedback, and Manual therapy   PLAN FOR NEXT SESSION: Revisit bowel retraining for correct performance; manual techniques as needed; functional activity training with pelvic floor.      Heather Roberts, PT, DPT03/05/245:06 PM

## 2023-02-27 DIAGNOSIS — M7062 Trochanteric bursitis, left hip: Secondary | ICD-10-CM | POA: Diagnosis not present

## 2023-02-27 NOTE — Progress Notes (Signed)
[facility]   Subjective:     Chief Complaint:  Chief Complaint  Patient presents with   Pessary Check    Mackenzie Green is a 77 y.o. female is here due to her pessary falling out. Needs to get resized.   History of Present Illness: Mackenzie Green is a 77 y.o. female with stage III pelvic organ prolapse and stress incontinence who presents for a pessary check. She is using a size 2in short stem gellhorn pessary. The pessary came out at home and she notices a small amount of bleeding. She is using vaginal estrogen.    Past Medical History: Patient  has a past medical history of Low iron, Mild cognitive impairment with memory loss, and Mitral valve prolapse.   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   Medications: She has a current medication list which includes the following prescription(s): biotin, calcium, celecoxib, cephalexin, cholecalciferol, cholestyramine, clobetasol prop emollient base, ferrous sulfate, iron, fluconazole, fluzone high-dose quadrivalent, folic acid, magnesium citrate, vitamin k2, multivitamin adult, ascorbic acid, and vitamin k.   Allergies: Patient is allergic to dairy aid [tilactase].   Social History: Patient  reports that she has never smoked. She has never used smokeless tobacco. She reports current alcohol use. She reports that she does not use drugs.      Objective:    Physical Exam: BP 131/66   Pulse 99  Gen: No apparent distress, A&O x 3. Detailed Urogynecologic Evaluation:  Pelvic Exam: Normal external female genitalia; Bartholin's and Skene's glands normal in appearance; urethral meatus with caruncle, no urethral masses or discharge. The pessary was noted to be dislodged. It was removed and cleaned. Speculum exam revealed no lesions in the vagina. The pessary was Changed to 2 1/4in short stem Gellhorn.    Attempted a size 0 cube  which her prolapse pushed around Size 2 cube was too large and we did not have a size 1 cube in stock to try   Assessment/Plan:    Assessment: Mackenzie Green is a 77 y.o. with stage III pelvic organ prolapse and stress incontinence here for a pessary check. She is doing well.  She is very concerned about increased vaginal discharge and bleeding. We discussed that she can use Trimosan jelly on days she is not using her vaginal estrogen. We also discussed using lubrication into the vagina and that she can use coconut oil inside the vagina for lubrication as well. We discussed doing the estrogen cream around the clitoris, urethra and at the vaginal opening and she reported she felt she was not using enough.  Plan: She will keep the pessary in place until next visit. She will continue to use estrogen. She will follow-up in 1 months for a pessary check or sooner as needed.   All questions were answered.

## 2023-02-28 ENCOUNTER — Encounter: Payer: Self-pay | Admitting: Obstetrics and Gynecology

## 2023-02-28 ENCOUNTER — Ambulatory Visit: Payer: Medicare Other | Admitting: Obstetrics and Gynecology

## 2023-02-28 VITALS — BP 131/66 | HR 99

## 2023-02-28 DIAGNOSIS — Z4689 Encounter for fitting and adjustment of other specified devices: Secondary | ICD-10-CM

## 2023-02-28 DIAGNOSIS — N812 Incomplete uterovaginal prolapse: Secondary | ICD-10-CM | POA: Diagnosis not present

## 2023-02-28 DIAGNOSIS — N8110 Cystocele, unspecified: Secondary | ICD-10-CM

## 2023-02-28 NOTE — Patient Instructions (Addendum)
Please use the estrogen cream every night for two weeks and then use it twice a week.  Once you start doing it twice a week, use the trimosan jelly between the estrogen.   You want to use a large blueberry sized amount of estrogen cream onto the finger and massage it into the vaginal walls, around the opening and around the clitoris and urethra. You want to make sure you use enough on the vaginal walls to lubricate it well.   The trimosan jelly you can use similarly.

## 2023-03-03 DIAGNOSIS — H35363 Drusen (degenerative) of macula, bilateral: Secondary | ICD-10-CM | POA: Diagnosis not present

## 2023-03-03 DIAGNOSIS — H25813 Combined forms of age-related cataract, bilateral: Secondary | ICD-10-CM | POA: Diagnosis not present

## 2023-03-03 DIAGNOSIS — H40033 Anatomical narrow angle, bilateral: Secondary | ICD-10-CM | POA: Diagnosis not present

## 2023-03-03 DIAGNOSIS — H04123 Dry eye syndrome of bilateral lacrimal glands: Secondary | ICD-10-CM | POA: Diagnosis not present

## 2023-03-04 ENCOUNTER — Encounter: Payer: Self-pay | Admitting: Obstetrics and Gynecology

## 2023-03-04 DIAGNOSIS — M7062 Trochanteric bursitis, left hip: Secondary | ICD-10-CM | POA: Diagnosis not present

## 2023-03-06 DIAGNOSIS — M7062 Trochanteric bursitis, left hip: Secondary | ICD-10-CM | POA: Diagnosis not present

## 2023-03-11 DIAGNOSIS — M7062 Trochanteric bursitis, left hip: Secondary | ICD-10-CM | POA: Diagnosis not present

## 2023-03-13 ENCOUNTER — Ambulatory Visit: Payer: Medicare Other

## 2023-03-13 DIAGNOSIS — R279 Unspecified lack of coordination: Secondary | ICD-10-CM | POA: Diagnosis not present

## 2023-03-13 DIAGNOSIS — M6281 Muscle weakness (generalized): Secondary | ICD-10-CM

## 2023-03-13 DIAGNOSIS — M25552 Pain in left hip: Secondary | ICD-10-CM

## 2023-03-13 DIAGNOSIS — R293 Abnormal posture: Secondary | ICD-10-CM

## 2023-03-13 DIAGNOSIS — M7062 Trochanteric bursitis, left hip: Secondary | ICD-10-CM | POA: Diagnosis not present

## 2023-03-13 DIAGNOSIS — M62838 Other muscle spasm: Secondary | ICD-10-CM

## 2023-03-13 NOTE — Therapy (Signed)
OUTPATIENT PHYSICAL THERAPY TREATMENT NOTE   Patient Name: Mackenzie Green MRN: [id number] DOB:05/29/1946, 77 y.o., female Today's Date: 03/13/2023  PCP: Aretta Nip, MD REFERRING PROVIDER: Faustino Congress, NP  END OF SESSION:   PT End of Session - 03/13/23 1531     Visit Number 12    Date for PT Re-Evaluation 04/22/23    Authorization Type [employer]    PT Start Time 1530    PT Stop Time 1610    PT Time Calculation (min) 40 min    Activity Tolerance Patient tolerated treatment well    Behavior During Therapy WFL for tasks assessed/performed                   Past Medical History:  Diagnosis Date   Low iron    Mild cognitive impairment with memory loss    Mitral valve prolapse    Past Surgical History:  Procedure Laterality Date   TEAR DUCT PROBING     TOTAL HIP ARTHROPLASTY Left 03/27/2022   Procedure: LEFT POSTERIOR TOTAL HIP ARTHROPLASTY;  Surgeon: Willaim Sheng, MD;  Location: [facility];  Service: Orthopedics;  Laterality: Left;   Patient Active Problem List   Diagnosis Date Noted   Hip fracture ([place]) 03/26/2022   Mild cognitive impairment with memory loss 03/26/2022   Depression with anxiety 03/26/2022   Spinal stenosis at L4-L5 level 03/26/2022   Neutropenia ([place]) 08/27/2016   Weight loss 07/31/2012   Hypokalemia 07/31/2012   Hyponatremia 07/30/2012   Hypoglycemia 07/30/2012    REFERRING DIAG: N81.10 (ICD-10-CM) - Cystocele, unspecified R15.9 (ICD-10-CM) - Full incontinence of feces  THERAPY DIAG:  Abnormal posture  Muscle weakness (generalized)  Unspecified lack of coordination  Pain in left hip  Other muscle spasm  Rationale for Evaluation and Treatment Rehabilitation  PERTINENT HISTORY: Lichens sclerosis, cystocele, hemorrhoids, diarrhea, 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 She had bleeding for the first week with a lot of discharge. Now she is still having discharge, but no bleeding. She has been trying to avoid as much pad usage due to irritation. She has not had any decrease in nocturia or fecal incontinence.    PAIN:  Are you having pain? Yes: NPRS scale: 3/10 Pain location: Lt hip Pain description: stiff Aggravating factors: walking -  unrelenting pain, standing Relieving factors: pain medication to some extent   11/19/22 SUBJECTIVE STATEMENT: Pt states that she has had issues with diarrhea and fecal incontinence; this is better after getting a pessary about a month and a half ago. She was having vaginal bleeding and was told this was from bladder prolapse; she has not had this since pessary was placed. She also had series of UTIs and a yeast infection. She is working on trying to build bone density right now due to fall and Lt hip replacement. She has been doing pelvic floor contractions 4x/day in various positions until MD told her her muscles were too far gone to do any good, therefore she stopped. She states that she is a little overwhelmed with all of her exercises for bones/muscles. She does also go to the pool. She is active and motivated. She is having consistent yellow/brown discharge since wearing pessary - she has talked with MD about it. Her pessary was placed and will remain in place until her 3 month appointment.  Fluid intake: Yes: not discussed in detail     PAIN:  Are you having pain? Yes NPRS scale: no provided this session Pain location:  Lt hip pain   Pain type: aching Pain description: intermittent    Aggravating factors: walking Relieving factors: rest   PRECAUTIONS: None   WEIGHT BEARING RESTRICTIONS: No   FALLS:  Has patient fallen  in last 6 months? 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/25/22, spinal stenosis L4-5, osteopenia/osteoporosis Sexual abuse: No   BOWEL MOVEMENT: Pain with bowel movement: No Type of bowel movement:Frequency sometimes multiple times a day, sometimes just once and Strain No Fully empty rectum: Yes: fees empty, but then she still has to go Leakage: No - not in the last several weeks besides occasional leakage - at time of referral she was having bowel movement leakage outside of control every time she urinated  Pads: Yes: - Fiber supplement: No *uses wash cloth to clean   URINATION: Pain with urination: No Fully empty bladder: Yes: she states that she tries to Stream: Strong Urgency: Yes: does not hold urine well Frequency: 2x/night; every hour - unchanged since she got pessary Leakage: Urge to void and Walking to the bathroom Pads: Yes: goes through several a day, mostly for discharge    INTERCOURSE: Pain with intercourse:  not sexually active currently; history of pain since menopause     PREGNANCY: Vaginal deliveries 2 Tearing Yes: forceps delivery ; significant tearing     PROLAPSE: Cystocele had to press bladder back inside vaginal canal prior to pessary     OBJECTIVE:  02/11/23: Palpation: severe tenderness, muscle spasm/trigger points, scar tissue restriction over Lt hip  Balance: unable to stand on Lt LE Hip strength:  Rt: grossly 5/5 Lt: flexion 4/5, adduction 3/5, abduction 2/5 and pain, IR 3/5 and pain, ER 3/5 and pain  Pelvic floor strength: (Rectal exam) Pelvic floor strength 3/5 Pelvic floor endurance 22 seconds Active lichens sclerosis plaques present around anus with skin irritation  01/07/23: Grade 3 anterior vaginal wall  laxity without pessary Pelvic floor strength 2/5, external anal sphincter 2/5 Difficulty with appropriate bearing down/bulge - able to perform balloon breathing with cuing  11/19/22:  COGNITION: Overall cognitive status: Within functional limits  for tasks assessed                          SENSATION: Light touch: Appears intact Proprioception: Appears intact   MUSCLE LENGTH:     FUNCTIONAL TESTS:      GAIT: Lt trendelenburg   POSTURE: rounded shoulders, forward head, decreased lumbar lordosis, increased thoracic kyphosis, and posterior pelvic tilt   LUMBARAROM/PROM:     LOWER EXTREMITY MMT:     PALPATION:   General  mild abdominal doming with increased abdominal pressure                 External Perineal Exam: palor in tissues, fusion of labia minora with labia majora, bright red urethral tissue, red irritated issue in vestibule                             Internal Pelvic Floor no tenderness, pessary present   Patient confirms identification and approves PT to assess internal pelvic floor and treatment Yes   PELVIC MMT:   MMT eval  Vaginal 2/5, endurance 3 seconds, repeat contractions 4x  Internal Anal Sphincter    External Anal Sphincter    Puborectalis    Diastasis Recti WNL  (Blank rows = not tested)         TONE: low   PROLAPSE: Pessary present, but anterior vaginal wall laxity still noted with bear down, no rectocele appreciated with pessary   TODAY'S TREATMENT 03/13/23 Manual: Trigger Point Dry-Needling  Treatment instructions: Expect mild to moderate muscle soreness. S/S of pneumothorax if dry needled over a lung field, and to seek immediate medical attention should they occur. Patient verbalized understanding of these instructions and education.  Patient Consent Given: Yes Education handout provided: Yes Muscles treated: Lt TFL, vastus lateralis, adductors, and glutes Electrical stimulation performed: No Parameters: N/A Treatment response/outcome:  twitch response and improved tension Instrument assisted soft tissue mobilization Lt hip Neuromuscular re-education: Pelvic floor contractions: Quick flicks Long holds Various positions: sitting, regular stance, wide stance, staggered stance Pelvic tilts 2 x 10 Seated hip adduction ball squeeze 10x Seated hip abduction yellow loop 10x Seated hip internal rotation yellow loop 10x  TREATMENT 02/25/23 Manual: Trigger Point Dry-Needling  Treatment instructions: Expect mild to moderate muscle soreness. S/S of pneumothorax if dry needled over a lung field, and to seek immediate medical attention should they occur. Patient verbalized understanding of these instructions and education.  Patient Consent Given: Yes Education handout provided: Yes Muscles treated: Lt TFL, vastus lateralis, adductors, and glutes Electrical stimulation performed: No Parameters: N/A Treatment response/outcome: twitch response and improved tension Instrument assisted soft tissue mobilization Lt hip  Neuromuscular re-education: Pelvic floor contraction training focusing on external anal sphincter and superficial pelvic floor Bulge training to help with relaxation in taking pessary out    TREATMENT 02/18/23: Manual: Trigger Point Dry-Needling  Treatment instructions: Expect mild to moderate muscle soreness. S/S of pneumothorax if dry needled over a lung field, and to seek immediate medical attention should they occur. Patient verbalized understanding of these instructions and education.  Patient Consent Given: Yes Education handout provided: Yes Muscles treated: Lt TFL, vastus lateralis, adductors, and glutes Electrical stimulation performed: No Parameters: N/A Treatment response/outcome: twitch response and improved tension Soft tissue mobilization Lt hip Negative pressure soft tissue mobilization to Lt hip Therapeutic activities: Review of strict bowel/bladder retraining - strongly encouraged to adhere to  after meal voiding attempts Encouraged  that it sounds like fiber is helping due to stopping fiber and increase in symptoms Review/modification to pelvic floor strengthening program, focusing on long holds since she is going to be performing urge drill consistently throughout the day with bladder retraining Urge drill review Double voiding  PATIENT EDUCATION:  Education details: see above Person educated: Patient Education method: Explanation, Demonstration, Tactile cues, Verbal cues, and Handouts Education comprehension: verbalized understanding   HOME EXERCISE PROGRAM: Access Code: [email or web address]     ASSESSMENT:   CLINICAL IMPRESSION: Pt ambulating more normally today with less Lt LE antalgic gait pattern. She still requests DN to help decrease pain and restriction further. Good tolerance to DN and manual techniques with continue improvement in soft tissue restriction. Pelvic floor contractions reviewed and she was encouraged to perform in various positions at home to help with functional ability and to incorporate them more often. She did well, especially with wide stance and staggered stance, challenging pelvic floor more in these positions. She will continue to benefit from skilled PT intervention in order to decrease Lt hip pain, improve urinary/bowel symptoms, perform pelvic floor strengthening program, and incorporate pelvic floor into functional strengthening with appropriate pressure management.    OBJECTIVE IMPAIRMENTS: Abnormal gait, decreased activity tolerance, decreased coordination, decreased endurance, decreased strength, increased fascial restrictions, increased muscle spasms, impaired tone, postural dysfunction, and pain.    ACTIVITY LIMITATIONS: continence   PARTICIPATION LIMITATIONS: community activity   PERSONAL FACTORS: 3+ comorbidities: Lichens sclerosis, cystocele, hemorrhoids, diarrhea, Lt THA 04/25/22, spinal stenosis L4-5, osteopenia/osteoporosis  are also affecting  patient's functional outcome.    REHAB POTENTIAL: Good   CLINICAL DECISION MAKING: Stable/uncomplicated   EVALUATION COMPLEXITY: Low     GOALS: Goals reviewed with patient? Yes   SHORT TERM GOALS: Target date: 12/17/22 - updated 12/25/22 - updated 02/05/23 new goal 04/22/2023    Pt will be independent with HEP.    Baseline: Goal status: MET 02/11/23   2.  Pt will be able to correctly perform diaphragmatic breathing and appropriate pressure management in order to prevent worsening vaginal wall laxity and improve pelvic floor A/ROM.    Baseline:  Goal status: Met 02/11/23   3.  Pt will be independent with the knack, urge suppression technique, and double voiding in order to improve bladder habits and decrease urinary/fecal incontinence.    Baseline:  Goal status: IN PROGRESS     LONG TERM GOALS: Target date: 01/28/22 - updated 12/25/22 - updated 02/05/23 new goal 04/22/2023    Pt will be independent with advanced HEP.    Baseline:  Goal status: IN PROGRESS   2.  Pt will demonstrate normal pelvic floor muscle tone and A/ROM, able to achieve 3/5 strength with contractions and 10 sec endurance, in order to provide appropriate lumbopelvic support in functional activities.    Baseline:  Goal status: MET 02/11/23   3.  Pt will report improved sensation of control over urgency and continence with bowel movements without any episodes of fecal incontinence.  Baseline:  Goal status: IIN PROGRESS   4.  Pt will be able to go 2-3 hours in between voids without urgency or incontinence in order to improve QOL and perform all functional activities with less difficulty.    Baseline:  Goal status: IN PROGRESS  5. Pt will demonstrate normal pelvic floor muscle tone and A/ROM, able to achieve 4/5 strength with contractions and 30 sec endurance, in order to provide appropriate lumbopelvic support in functional activities.    Baseline:  Goal status: INITIAL  6. Pt will report no higher Lt hip pain  during functional activities than 2/10 and be able to stand/walk for greater than 30 minutes without difficulty.    Baseline:  Goal status: INITIAL     PLAN:   PT FREQUENCY: 1x/week   PT DURATION: 10 weeks   PLANNED INTERVENTIONS: Therapeutic exercises, Therapeutic activity, Neuromuscular re-education, Balance training, Gait training, Patient/Family education, Self Care, Joint mobilization, Dry Needling, Biofeedback, and Manual therapy   PLAN FOR NEXT SESSION: Revisit bowel retraining for correct performance; manual techniques as needed; functional activity training with pelvic floor.      Heather Roberts, PT, DPT03/21/244:15 PM

## 2023-03-17 DIAGNOSIS — M7062 Trochanteric bursitis, left hip: Secondary | ICD-10-CM | POA: Diagnosis not present

## 2023-03-19 DIAGNOSIS — M7062 Trochanteric bursitis, left hip: Secondary | ICD-10-CM | POA: Diagnosis not present

## 2023-03-25 DIAGNOSIS — M7062 Trochanteric bursitis, left hip: Secondary | ICD-10-CM | POA: Diagnosis not present

## 2023-03-27 DIAGNOSIS — Z8781 Personal history of (healed) traumatic fracture: Secondary | ICD-10-CM | POA: Diagnosis not present

## 2023-03-27 DIAGNOSIS — M810 Age-related osteoporosis without current pathological fracture: Secondary | ICD-10-CM | POA: Diagnosis not present

## 2023-03-27 DIAGNOSIS — Z96641 Presence of right artificial hip joint: Secondary | ICD-10-CM | POA: Diagnosis not present

## 2023-03-28 ENCOUNTER — Ambulatory Visit: Payer: Medicare Other | Attending: Family Medicine

## 2023-03-28 DIAGNOSIS — R279 Unspecified lack of coordination: Secondary | ICD-10-CM | POA: Diagnosis not present

## 2023-03-28 DIAGNOSIS — R293 Abnormal posture: Secondary | ICD-10-CM | POA: Insufficient documentation

## 2023-03-28 DIAGNOSIS — M62838 Other muscle spasm: Secondary | ICD-10-CM | POA: Diagnosis not present

## 2023-03-28 DIAGNOSIS — M6281 Muscle weakness (generalized): Secondary | ICD-10-CM | POA: Diagnosis not present

## 2023-03-28 DIAGNOSIS — M25552 Pain in left hip: Secondary | ICD-10-CM | POA: Insufficient documentation

## 2023-03-28 DIAGNOSIS — M7062 Trochanteric bursitis, left hip: Secondary | ICD-10-CM | POA: Diagnosis not present

## 2023-03-28 NOTE — Therapy (Signed)
OUTPATIENT PHYSICAL THERAPY TREATMENT NOTE   Patient Name: Mackenzie Green MRN: [id number] DOB:[birth year], 77 y.o., female Today's Date: 03/28/2023  PCP: Clayborn Heron, MD REFERRING PROVIDER: Moshe Cipro, NP  END OF SESSION:   PT End of Session - 03/28/23 0846     Visit Number 13    Date for PT Re-Evaluation 04/22/23    Authorization Type [employer]    PT Start Time 0845    PT Stop Time 0930    PT Time Calculation (min) 45 min    Activity Tolerance Patient tolerated treatment well    Behavior During Therapy WFL for tasks assessed/performed                    Past Medical History:  Diagnosis Date   Low iron    Mild cognitive impairment with memory loss    Mitral valve prolapse    Past Surgical History:  Procedure Laterality Date   TEAR DUCT PROBING     TOTAL HIP ARTHROPLASTY Left 03/27/2022   Procedure: LEFT POSTERIOR TOTAL HIP ARTHROPLASTY;  Surgeon: Joen Laura, MD;  Location: [facility] OR;  Service: Orthopedics;  Laterality: Left;   Patient Active Problem List   Diagnosis Date Noted   Hip fracture 03/26/2022   Mild cognitive impairment with memory loss 03/26/2022   Depression with anxiety 03/26/2022   Spinal stenosis at L4-L5 level 03/26/2022   Neutropenia 08/27/2016   Weight loss 07/31/2012   Hypokalemia 07/31/2012   Hyponatremia 07/30/2012   Hypoglycemia 07/30/2012    REFERRING DIAG: N81.10 (ICD-10-CM) - Cystocele, unspecified R15.9 (ICD-10-CM) - Full incontinence of feces  THERAPY DIAG:  Abnormal posture  Muscle weakness (generalized)  Unspecified lack of coordination  Pain in left hip  Other muscle spasm  Rationale for Evaluation and Treatment Rehabilitation  PERTINENT HISTORY: Lichens sclerosis, cystocele, hemorrhoids, diarrhea, Lt THA 04/25/22, spinal stenosis L4-5, osteopenia/osteoporosis  PRECAUTIONS: NA  SUBJECTIVE:                                                                                                                                                                                       SUBJECTIVE STATEMENT: Pt states that she does feel like she is getting better. She feels like needling last visit was very helpful. Her other PT is also helping. Pt states that her bowel movements are still very inconsistent, but even after a normal BM, she will have a little piece of stool come out that she does not feel. She will also have explosive diarrhea in the same day. She can also go several days without having any BM.    PAIN:  Are you having pain? Yes:  NPRS scale: 3/10 Pain location: Lt hip Pain description: stiff Aggravating factors: walking - unrelenting pain, standing Relieving factors: pain medication to some extent   11/19/22 SUBJECTIVE STATEMENT: Pt states that she has had issues with diarrhea and fecal incontinence; this is better after getting a pessary about a month and a half ago. She was having vaginal bleeding and was told this was from bladder prolapse; she has not had this since pessary was placed. She also had series of UTIs and a yeast infection. She is working on trying to build bone density right now due to fall and Lt hip replacement. She has been doing pelvic floor contractions 4x/day in various positions until MD told her her muscles were too far gone to do any good, therefore she stopped. She states that she is a little overwhelmed with all of her exercises for bones/muscles. She does also go to the pool. She is active and motivated. She is having consistent yellow/brown discharge since wearing pessary - she has talked with MD about it. Her pessary was placed and will remain in place until her 3 month appointment.  Fluid intake: Yes: not discussed in detail     PAIN:  Are you having pain? Yes NPRS scale: no provided this session Pain location:  Lt hip pain   Pain type: aching Pain description: intermittent    Aggravating factors: walking Relieving factors: rest    PRECAUTIONS: None   WEIGHT BEARING RESTRICTIONS: No   FALLS:  Has patient fallen in last 6 months? 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/25/22, spinal stenosis L4-5, osteopenia/osteoporosis Sexual abuse: No   BOWEL MOVEMENT: Pain with bowel movement: No Type of bowel movement:Frequency sometimes multiple times a day, sometimes just once and Strain No Fully empty rectum: Yes: fees empty, but then she still has to go Leakage: No - not in the last several weeks besides occasional leakage - at time of referral she was having bowel movement leakage outside of control every time she urinated  Pads: Yes: - Fiber supplement: No *uses wash cloth to clean   URINATION: Pain with urination: No Fully empty bladder: Yes: she states that she tries to Stream: Strong Urgency: Yes: does not hold urine well Frequency: 2x/night; every hour - unchanged since she got pessary Leakage: Urge to void and Walking to the bathroom Pads: Yes: goes through several a day, mostly for discharge    INTERCOURSE: Pain with intercourse:  not sexually active currently; history of pain since menopause     PREGNANCY: Vaginal deliveries 2 Tearing Yes: forceps delivery ; significant tearing     PROLAPSE: Cystocele had to press bladder back inside vaginal canal prior to pessary     OBJECTIVE:  02/11/23: Palpation: severe tenderness, muscle spasm/trigger points, scar tissue restriction over Lt hip  Balance: unable to stand on Lt LE Hip strength:  Rt: grossly 5/5 Lt: flexion 4/5, adduction 3/5, abduction 2/5 and pain, IR 3/5 and pain, ER 3/5 and pain  Pelvic floor strength: (Rectal exam) Pelvic floor strength 3/5 Pelvic floor endurance 22 seconds Active lichens sclerosis plaques  present around anus with skin irritation  01/07/23: Grade 3 anterior vaginal wall laxity without pessary Pelvic floor strength 2/5, external anal sphincter 2/5 Difficulty with appropriate bearing down/bulge - able to perform  balloon breathing with cuing  11/19/22:  COGNITION: Overall cognitive status: Within functional limits for tasks assessed                          SENSATION: Light touch: Appears intact Proprioception: Appears intact   MUSCLE LENGTH:     FUNCTIONAL TESTS:      GAIT: Lt trendelenburg   POSTURE: rounded shoulders, forward head, decreased lumbar lordosis, increased thoracic kyphosis, and posterior pelvic tilt   LUMBARAROM/PROM:     LOWER EXTREMITY MMT:     PALPATION:   General  mild abdominal doming with increased abdominal pressure                 External Perineal Exam: palor in tissues, fusion of labia minora with labia majora, bright red urethral tissue, red irritated issue in vestibule                             Internal Pelvic Floor no tenderness, pessary present   Patient confirms identification and approves PT to assess internal pelvic floor and treatment Yes   PELVIC MMT:   MMT eval  Vaginal 2/5, endurance 3 seconds, repeat contractions 4x  Internal Anal Sphincter    External Anal Sphincter    Puborectalis    Diastasis Recti WNL  (Blank rows = not tested)         TONE: low   PROLAPSE: Pessary present, but anterior vaginal wall laxity still noted with bear down, no rectocele appreciated with pessary   TODAY'S TREATMENT 03/28/23 Manual: Trigger Point Dry-Needling  Treatment instructions: Expect mild to moderate muscle soreness. S/S of pneumothorax if dry needled over a lung field, and to seek immediate medical attention should they occur. Patient verbalized understanding of these instructions and education.  Patient Consent Given: Yes Education handout provided: Yes Muscles treated: Lt TFL, vastus lateralis, adductors, and  glutes Electrical stimulation performed: No Parameters: N/A Treatment response/outcome: twitch response and improved tension Instrument assisted soft tissue mobilization Lt hip Neuromuscular re-education: Transversus abdominus training with multimodal cues for improved motor control and breath coordination UE ball press 10x Sidelying ball press 10x Ball press into knee march 10x bil   TREATMENT 03/13/23 Manual: Trigger Point Dry-Needling  Treatment instructions: Expect mild to moderate muscle soreness. S/S of pneumothorax if dry needled over a lung field, and to seek immediate medical attention should they occur. Patient verbalized understanding of these instructions and education.  Patient Consent Given: Yes Education handout provided: Yes Muscles treated: Lt TFL, vastus lateralis, adductors, and glutes Electrical stimulation performed: No Parameters: N/A Treatment response/outcome: twitch response and improved tension Instrument assisted soft tissue mobilization Lt hip Neuromuscular re-education: Pelvic floor contractions: Quick flicks Long holds Various positions: sitting, regular stance, wide stance, staggered stance Pelvic tilts 2 x 10 Seated hip adduction ball squeeze 10x Seated hip abduction yellow loop 10x Seated hip internal rotation yellow loop 10x  TREATMENT 02/25/23 Manual: Trigger Point Dry-Needling  Treatment instructions: Expect mild to moderate muscle soreness. S/S of pneumothorax if dry needled over a lung field, and to seek immediate medical attention should they occur. Patient verbalized understanding of these instructions and education.  Patient Consent Given: Yes Education handout provided: Yes Muscles treated: Lt TFL, vastus lateralis, adductors, and glutes Electrical stimulation performed: No Parameters: N/A Treatment response/outcome: twitch response and improved tension Instrument assisted soft tissue mobilization Lt hip  Neuromuscular  re-education: Pelvic floor contraction training  focusing on external anal sphincter and superficial pelvic floor Bulge training to help with relaxation in taking pessary out  PATIENT EDUCATION:  Education details: see above Person educated: Patient Education method: Explanation, Demonstration, Tactile cues, Verbal cues, and Handouts Education comprehension: verbalized understanding   HOME EXERCISE PROGRAM: Access Code: [email or web address]     ASSESSMENT:   CLINICAL IMPRESSION: Pt seeing good progress with Lt hip pain and has been able to perform more walking and get out in garden over the last week. She is not seeing progress with pelvic floor/fecal incontinence/urinary urgency; she feels like she has not been paying attention to intervention for these items as much when hip was more painful, but she is returning to them more now. She tolerated manual techniques and DN to Lt hip well today with good release of restriction; she also did well with core progressions with no increase in pain. HEP updated with new core exercises. She will continue to benefit from skilled PT intervention in order to decrease Lt hip pain, improve urinary/bowel symptoms, perform pelvic floor strengthening program, and incorporate pelvic floor into functional strengthening with appropriate pressure management.    OBJECTIVE IMPAIRMENTS: Abnormal gait, decreased activity tolerance, decreased coordination, decreased endurance, decreased strength, increased fascial restrictions, increased muscle spasms, impaired tone, postural dysfunction, and pain.    ACTIVITY LIMITATIONS: continence   PARTICIPATION LIMITATIONS: community activity   PERSONAL FACTORS: 3+ comorbidities: Lichens sclerosis, cystocele, hemorrhoids, diarrhea, Lt THA 04/25/22, spinal stenosis L4-5, osteopenia/osteoporosis  are also affecting patient's functional outcome.    REHAB POTENTIAL: Good   CLINICAL DECISION MAKING: Stable/uncomplicated   EVALUATION  COMPLEXITY: Low     GOALS: Goals reviewed with patient? Yes   SHORT TERM GOALS: Target date: 12/17/22 - updated 12/25/22 - updated 02/05/23 new goal 04/22/2023    Pt will be independent with HEP.    Baseline: Goal status: MET 02/11/23   2.  Pt will be able to correctly perform diaphragmatic breathing and appropriate pressure management in order to prevent worsening vaginal wall laxity and improve pelvic floor A/ROM.    Baseline:  Goal status: Met 02/11/23   3.  Pt will be independent with the knack, urge suppression technique, and double voiding in order to improve bladder habits and decrease urinary/fecal incontinence.    Baseline:  Goal status: IN PROGRESS     LONG TERM GOALS: Target date: 01/28/22 - updated 12/25/22 - updated 02/05/23 new goal 04/22/2023    Pt will be independent with advanced HEP.    Baseline:  Goal status: IN PROGRESS   2.  Pt will demonstrate normal pelvic floor muscle tone and A/ROM, able to achieve 3/5 strength with contractions and 10 sec endurance, in order to provide appropriate lumbopelvic support in functional activities.    Baseline:  Goal status: MET 02/11/23   3.  Pt will report improved sensation of control over urgency and continence with bowel movements without any episodes of fecal incontinence.  Baseline:  Goal status: IIN PROGRESS   4.  Pt will be able to go 2-3 hours in between voids without urgency or incontinence in order to improve QOL and perform all functional activities with less difficulty.    Baseline:  Goal status: IN PROGRESS  5. Pt will demonstrate normal pelvic floor muscle tone and A/ROM, able to achieve 4/5 strength with contractions and 30 sec endurance, in order to provide appropriate lumbopelvic support in functional activities.    Baseline:  Goal status: INITIAL  6. Pt will report no higher Lt hip  pain during functional activities than 2/10 and be able to stand/walk for greater than 30 minutes without difficulty.     Baseline:  Goal status: INITIAL     PLAN:   PT FREQUENCY: 1x/week   PT DURATION: 10 weeks   PLANNED INTERVENTIONS: Therapeutic exercises, Therapeutic activity, Neuromuscular re-education, Balance training, Gait training, Patient/Family education, Self Care, Joint mobilization, Dry Needling, Biofeedback, and Manual therapy   PLAN FOR NEXT SESSION: Revisit bowel retraining for correct performance; manual techniques as needed; functional activity training with pelvic floor.      Julio AlmKristen Maranatha Grossi, PT, DPT04/05/249:32 AM

## 2023-03-31 ENCOUNTER — Ambulatory Visit (INDEPENDENT_AMBULATORY_CARE_PROVIDER_SITE_OTHER): Payer: Medicare Other | Admitting: Obstetrics and Gynecology

## 2023-03-31 ENCOUNTER — Ambulatory Visit: Payer: Medicare Other | Admitting: Obstetrics and Gynecology

## 2023-03-31 ENCOUNTER — Encounter: Payer: Self-pay | Admitting: Obstetrics and Gynecology

## 2023-03-31 VITALS — BP 128/74 | HR 80

## 2023-03-31 DIAGNOSIS — N8110 Cystocele, unspecified: Secondary | ICD-10-CM

## 2023-03-31 DIAGNOSIS — N3941 Urge incontinence: Secondary | ICD-10-CM | POA: Diagnosis not present

## 2023-03-31 DIAGNOSIS — N812 Incomplete uterovaginal prolapse: Secondary | ICD-10-CM | POA: Diagnosis not present

## 2023-03-31 NOTE — Patient Instructions (Signed)

## 2023-03-31 NOTE — Progress Notes (Signed)
[facility] Return Visit  SUBJECTIVE  History of Present Illness: Mackenzie Green is a 77 y.o. female seen in follow-up for prolapse.  She has a 2-1/4 in ss gellhorn pessary in place. She is using vaginal estrogen.  Pap smear was negative 01/24/23. She is not sure the pessary will be a good option for her long term. Still has some occasional light vaginal bleeding.   Has had some urinary leakage, and urgency and frequency. Has been seeing pelvic physical therapy.   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 Nonvisualization of ovaries. Normal thickness of endometrial complex 2 mm thick; In the setting of post-menopausal bleeding, this is consistent with a benign etiology such as endometrial atrophy  Past Medical History: Patient  has a past medical history of Low iron, Mild cognitive impairment with memory loss, and Mitral valve prolapse.   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   Medications: She has a current medication list which includes the following prescription(s): biotin, calcium, cephalexin, cholecalciferol, cholestyramine, clobetasol prop emollient base, ferrous sulfate, iron, fluconazole, fluzone high-dose quadrivalent, folic acid, magnesium citrate, vitamin k2, multivitamin adult, ascorbic acid, and vitamin k.   Allergies: Patient is allergic to dairy aid [tilactase].   Social History: Patient  reports that she has never smoked. She has never used smokeless tobacco. She reports current alcohol use. She reports that she does not use drugs.      OBJECTIVE     Physical Exam: Vitals:   03/31/23 0822  BP: 128/74  Pulse: 80   Gen: No apparent distress, A&O x 3.  Detailed Urogynecologic Evaluation:  Deferred. Prior exam showed:   POP-Q (01/24/23)   2                                            Aa   2                                            Ba   -5                                              C    4.5                                            Gh   5                                            Pb   7                                            tvl    -2  Ap   -2                                            Bp   -5                                              D        ASSESSMENT AND PLAN    Mackenzie Green is a 77 y.o. with:  1. Prolapse of anterior vaginal wall   2. Uterovaginal prolapse, incomplete     - We discussed that the light vaginal bleeding is likely from irritation from the pessary. Normal uterus on [place] and normal pap smear.  - Reviewed options for surgery including vaginal hysterectomy with USLS and anterior repair, vs robotic hysterectomy and sacrocolpopexy. Handouts provided on options. We reviewed the patient's specific anatomic and functional findings, with the assistance of diagrams and handouts. We discussed risks of bleeding, infection, damage to surrounding organs including bowel, bladder, blood vessels, ureters and nerves, need for further surgery, numbness and weakness at any body site, buttock pain, postoperative cognitive dysfunction, and the rarer risks of blood clot, heart attack, pneumonia, death. All her questions were answered. - If she wants to pursue surgery, recommended urodynamic testing since she is having issues with leakage. We discussed possible sling if she demonstrates SUI.  - She will return for a pessary check in June and decide if she wants to pursue surgery. She has tentative urodynamics scheduled after that time.    Marguerita Beards, MD  Time spent: I spent 30 minutes dedicated to the care of this patient on the date of this encounter to include pre-visit review of records, face-to-face time with the patient and post visit documentation.

## 2023-04-01 DIAGNOSIS — M7062 Trochanteric bursitis, left hip: Secondary | ICD-10-CM | POA: Diagnosis not present

## 2023-04-02 ENCOUNTER — Ambulatory Visit: Payer: Medicare Other

## 2023-04-02 DIAGNOSIS — M62838 Other muscle spasm: Secondary | ICD-10-CM | POA: Diagnosis not present

## 2023-04-02 DIAGNOSIS — R279 Unspecified lack of coordination: Secondary | ICD-10-CM | POA: Diagnosis not present

## 2023-04-02 DIAGNOSIS — M25552 Pain in left hip: Secondary | ICD-10-CM | POA: Diagnosis not present

## 2023-04-02 DIAGNOSIS — R293 Abnormal posture: Secondary | ICD-10-CM | POA: Diagnosis not present

## 2023-04-02 DIAGNOSIS — M6281 Muscle weakness (generalized): Secondary | ICD-10-CM

## 2023-04-02 NOTE — Therapy (Signed)
OUTPATIENT PHYSICAL THERAPY TREATMENT NOTE   Patient Name: Mackenzie Green MRN: [id number] DOB:[birth year], 77 y.o., female Today's Date: 04/02/2023  PCP: Clayborn Heron, MD REFERRING PROVIDER: Moshe Cipro, NP  END OF SESSION:   PT End of Session - 04/02/23 0943     Visit Number 14    Date for PT Re-Evaluation 04/22/23    Authorization Type [employer]    PT Start Time 0930    PT Stop Time 1010    PT Time Calculation (min) 40 min    Activity Tolerance Patient tolerated treatment well    Behavior During Therapy WFL for tasks assessed/performed                     Past Medical History:  Diagnosis Date   Low iron    Mild cognitive impairment with memory loss    Mitral valve prolapse    Past Surgical History:  Procedure Laterality Date   TEAR DUCT PROBING     TOTAL HIP ARTHROPLASTY Left 03/27/2022   Procedure: LEFT POSTERIOR TOTAL HIP ARTHROPLASTY;  Surgeon: Joen Laura, MD;  Location: [facility] OR;  Service: Orthopedics;  Laterality: Left;   Patient Active Problem List   Diagnosis Date Noted   Hip fracture 03/26/2022   Mild cognitive impairment with memory loss 03/26/2022   Depression with anxiety 03/26/2022   Spinal stenosis at L4-L5 level 03/26/2022   Neutropenia 08/27/2016   Weight loss 07/31/2012   Hypokalemia 07/31/2012   Hyponatremia 07/30/2012   Hypoglycemia 07/30/2012    REFERRING DIAG: N81.10 (ICD-10-CM) - Cystocele, unspecified R15.9 (ICD-10-CM) - Full incontinence of feces  THERAPY DIAG:  Abnormal posture  Muscle weakness (generalized)  Unspecified lack of coordination  Pain in left hip  Other muscle spasm  Rationale for Evaluation and Treatment Rehabilitation  PERTINENT HISTORY: Lichens sclerosis, cystocele, hemorrhoids, diarrhea, 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 She saw doctor and talked about surgical options for bladder prolapse.    PAIN:  Are you having pain? Yes: NPRS scale: 3/10 Pain location: Lt hip Pain description: stiff Aggravating factors: walking - unrelenting pain, standing Relieving factors: pain medication to some extent   11/19/22 SUBJECTIVE STATEMENT: Pt states that she has had issues with diarrhea and fecal incontinence; this is better after getting a pessary about a month  and a half ago. She was having vaginal bleeding and was told this was from bladder prolapse; she has not had this since pessary was placed. She also had series of UTIs and a yeast infection. She is working on trying to build bone density right now due to fall and Lt hip replacement. She has been doing pelvic floor contractions 4x/day in various positions until MD told her her muscles were too far gone to do any good, therefore she stopped. She states that she is a little overwhelmed with all of her exercises for bones/muscles. She does also go to the pool. She is active and motivated. She is having consistent yellow/brown discharge since wearing pessary - she has talked with MD about it. Her pessary was placed and will remain in place until her 3 month appointment.  Fluid intake: Yes: not discussed in detail     PAIN:  Are you having pain? Yes NPRS scale: no provided this session Pain location:  Lt hip pain   Pain type: aching Pain description: intermittent    Aggravating factors: walking Relieving factors: rest   PRECAUTIONS: None   WEIGHT BEARING RESTRICTIONS: No   FALLS:  Has patient fallen in last 6 months? No   LIVING ENVIRONMENT: Lives with: lives with their family Lives in: House/apartment     OCCUPATION: retired   PLOF: Independent   PATIENT GOALS: to strengthen  pelvic floor    PERTINENT HISTORY:  Lichens sclerosis, cystocele, hemorrhoids, diarrhea, Lt THA 04/25/22, spinal stenosis L4-5, osteopenia/osteoporosis Sexual abuse: No   BOWEL MOVEMENT: Pain with bowel movement: No Type of bowel movement:Frequency sometimes multiple times a day, sometimes just once and Strain No Fully empty rectum: Yes: fees empty, but then she still has to go Leakage: No - not in the last several weeks besides occasional leakage - at time of referral she was having bowel movement leakage outside of control every time she urinated  Pads: Yes: - Fiber supplement: No *uses wash cloth to clean   URINATION: Pain with urination: No Fully empty bladder: Yes: she states that she tries to Stream: Strong Urgency: Yes: does not hold urine well Frequency: 2x/night; every hour - unchanged since she got pessary Leakage: Urge to void and Walking to the bathroom Pads: Yes: goes through several a day, mostly for discharge    INTERCOURSE: Pain with intercourse:  not sexually active currently; history of pain since menopause     PREGNANCY: Vaginal deliveries 2 Tearing Yes: forceps delivery ; significant tearing     PROLAPSE: Cystocele had to press bladder back inside vaginal canal prior to pessary     OBJECTIVE:  02/11/23: Palpation: severe tenderness, muscle spasm/trigger points, scar tissue restriction over Lt hip  Balance: unable to stand on Lt LE Hip strength:  Rt: grossly 5/5 Lt: flexion 4/5, adduction 3/5, abduction 2/5 and pain, IR 3/5 and pain, ER 3/5 and pain  Pelvic floor strength: (Rectal exam) Pelvic floor strength 3/5 Pelvic floor endurance 22 seconds Active lichens sclerosis plaques present around anus with skin irritation  01/07/23: Grade 3 anterior vaginal wall laxity without pessary Pelvic floor strength 2/5, external anal sphincter 2/5 Difficulty with appropriate bearing down/bulge - able to perform balloon breathing with cuing  11/19/22:   COGNITION: Overall cognitive status: Within functional limits for tasks assessed                          SENSATION: Light touch: Appears intact Proprioception: Appears intact  MUSCLE LENGTH:     FUNCTIONAL TESTS:      GAIT: Lt trendelenburg   POSTURE: rounded shoulders, forward head, decreased lumbar lordosis, increased thoracic kyphosis, and posterior pelvic tilt   LUMBARAROM/PROM:     LOWER EXTREMITY MMT:     PALPATION:   General  mild abdominal doming with increased abdominal pressure                 External Perineal Exam: palor in tissues, fusion of labia minora with labia majora, bright red urethral tissue, red irritated issue in vestibule                             Internal Pelvic Floor no tenderness, pessary present   Patient confirms identification and approves PT to assess internal pelvic floor and treatment Yes   PELVIC MMT:   MMT eval  Vaginal 2/5, endurance 3 seconds, repeat contractions 4x  Internal Anal Sphincter    External Anal Sphincter    Puborectalis    Diastasis Recti WNL  (Blank rows = not tested)         TONE: low   PROLAPSE: Pessary present, but anterior vaginal wall laxity still noted with bear down, no rectocele appreciated with pessary   TODAY'S TREATMENT 04/02/23 Manual: Trigger Point Dry-Needling  Treatment instructions: Expect mild to moderate muscle soreness. S/S of pneumothorax if dry needled over a lung field, and to seek immediate medical attention should they occur. Patient verbalized understanding of these instructions and education.  Patient Consent Given: Yes Education handout provided: Yes Muscles treated: Lt TFL, vastus lateralis, adductors, and glutes Electrical stimulation performed: No Parameters: N/A Treatment response/outcome: twitch response and improved tension Instrument assisted soft tissue mobilization Lt hip Neuromuscular re-education: Supine ball to knee core facilitation Review core strengthening  exercises Therapeutic activities: Education on RUSI for future treatment sessions   TREATMENT 03/28/23 Manual: Trigger Point Dry-Needling  Treatment instructions: Expect mild to moderate muscle soreness. S/S of pneumothorax if dry needled over a lung field, and to seek immediate medical attention should they occur. Patient verbalized understanding of these instructions and education.  Patient Consent Given: Yes Education handout provided: Yes Muscles treated: Lt TFL, vastus lateralis, adductors, and glutes Electrical stimulation performed: No Parameters: N/A Treatment response/outcome: twitch response and improved tension Instrument assisted soft tissue mobilization Lt hip Neuromuscular re-education: Transversus abdominus training with multimodal cues for improved motor control and breath coordination UE ball press 10x Sidelying ball press 10x Ball press into knee march 10x bil   TREATMENT 03/13/23 Manual: Trigger Point Dry-Needling  Treatment instructions: Expect mild to moderate muscle soreness. S/S of pneumothorax if dry needled over a lung field, and to seek immediate medical attention should they occur. Patient verbalized understanding of these instructions and education.  Patient Consent Given: Yes Education handout provided: Yes Muscles treated: Lt TFL, vastus lateralis, adductors, and glutes Electrical stimulation performed: No Parameters: N/A Treatment response/outcome: twitch response and improved tension Instrument assisted soft tissue mobilization Lt hip Neuromuscular re-education: Pelvic floor contractions: Quick flicks Long holds Various positions: sitting, regular stance, wide stance, staggered stance Pelvic tilts 2 x 10 Seated hip adduction ball squeeze 10x Seated hip abduction yellow loop 10x Seated hip internal rotation yellow loop 10x  PATIENT EDUCATION:  Education details: see above Person educated: Patient Education method: Explanation,  Demonstration, ActorTactile cues, Verbal cues, and Handouts Education comprehension: verbalized understanding   HOME EXERCISE PROGRAM: Access Code: [email or web address]  ASSESSMENT:   CLINICAL IMPRESSION: Pt continues to see good progress with LT hip. We reviewed core strengthening activities and discussed how this will hopefully begin to help with Lt hip pain and pressure management/pelvic floor strengthening. Good tolerance to manual techniques and dry needling; this is allowing her to continue performing beneficial intervention to obtain longer term relief from LT hip pain and perform exercises to benefit pelvic floor. She will continue to benefit from skilled PT intervention in order to decrease Lt hip pain, improve urinary/bowel symptoms, perform pelvic floor strengthening program, and incorporate pelvic floor into functional strengthening with appropriate pressure management.    OBJECTIVE IMPAIRMENTS: Abnormal gait, decreased activity tolerance, decreased coordination, decreased endurance, decreased strength, increased fascial restrictions, increased muscle spasms, impaired tone, postural dysfunction, and pain.    ACTIVITY LIMITATIONS: continence   PARTICIPATION LIMITATIONS: community activity   PERSONAL FACTORS: 3+ comorbidities: Lichens sclerosis, cystocele, hemorrhoids, diarrhea, Lt THA 04/25/22, spinal stenosis L4-5, osteopenia/osteoporosis  are also affecting patient's functional outcome.    REHAB POTENTIAL: Good   CLINICAL DECISION MAKING: Stable/uncomplicated   EVALUATION COMPLEXITY: Low     GOALS: Goals reviewed with patient? Yes   SHORT TERM GOALS: Target date: 12/17/22 - updated 12/25/22 - updated 02/05/23 new goal 04/22/2023    Pt will be independent with HEP.    Baseline: Goal status: MET 02/11/23   2.  Pt will be able to correctly perform diaphragmatic breathing and appropriate pressure management in order to prevent worsening vaginal wall laxity and improve pelvic floor A/ROM.     Baseline:  Goal status: Met 02/11/23   3.  Pt will be independent with the knack, urge suppression technique, and double voiding in order to improve bladder habits and decrease urinary/fecal incontinence.    Baseline:  Goal status: IN PROGRESS     LONG TERM GOALS: Target date: 01/28/22 - updated 12/25/22 - updated 02/05/23 new goal 04/22/2023    Pt will be independent with advanced HEP.    Baseline:  Goal status: IN PROGRESS   2.  Pt will demonstrate normal pelvic floor muscle tone and A/ROM, able to achieve 3/5 strength with contractions and 10 sec endurance, in order to provide appropriate lumbopelvic support in functional activities.    Baseline:  Goal status: MET 02/11/23   3.  Pt will report improved sensation of control over urgency and continence with bowel movements without any episodes of fecal incontinence.  Baseline:  Goal status: IIN PROGRESS   4.  Pt will be able to go 2-3 hours in between voids without urgency or incontinence in order to improve QOL and perform all functional activities with less difficulty.    Baseline:  Goal status: IN PROGRESS  5. Pt will demonstrate normal pelvic floor muscle tone and A/ROM, able to achieve 4/5 strength with contractions and 30 sec endurance, in order to provide appropriate lumbopelvic support in functional activities.    Baseline:  Goal status: INITIAL  6. Pt will report no higher Lt hip pain during functional activities than 2/10 and be able to stand/walk for greater than 30 minutes without difficulty.    Baseline:  Goal status: INITIAL     PLAN:   PT FREQUENCY: 1x/week   PT DURATION: 10 weeks   PLANNED INTERVENTIONS: Therapeutic exercises, Therapeutic activity, Neuromuscular re-education, Balance training, Gait training, Patient/Family education, Self Care, Joint mobilization, Dry Needling, Biofeedback, and Manual therapy   PLAN FOR NEXT SESSION: Revisit bowel retraining for correct performance; manual techniques as  needed; functional activity training with  pelvic floor.      Julio Alm, PT, DPT04/09/2409:36 AM

## 2023-04-03 DIAGNOSIS — M7062 Trochanteric bursitis, left hip: Secondary | ICD-10-CM | POA: Diagnosis not present

## 2023-04-03 DIAGNOSIS — D709 Neutropenia, unspecified: Secondary | ICD-10-CM | POA: Diagnosis not present

## 2023-04-03 DIAGNOSIS — M810 Age-related osteoporosis without current pathological fracture: Secondary | ICD-10-CM | POA: Diagnosis not present

## 2023-04-03 DIAGNOSIS — Z9189 Other specified personal risk factors, not elsewhere classified: Secondary | ICD-10-CM | POA: Diagnosis not present

## 2023-04-03 NOTE — Telephone Encounter (Signed)
LM on the VM for the patient to call back 

## 2023-04-07 ENCOUNTER — Encounter: Payer: Self-pay | Admitting: *Deleted

## 2023-04-07 DIAGNOSIS — Z681 Body mass index (BMI) 19 or less, adult: Secondary | ICD-10-CM | POA: Diagnosis not present

## 2023-04-07 DIAGNOSIS — R300 Dysuria: Secondary | ICD-10-CM | POA: Diagnosis not present

## 2023-04-07 DIAGNOSIS — N390 Urinary tract infection, site not specified: Secondary | ICD-10-CM | POA: Diagnosis not present

## 2023-04-07 DIAGNOSIS — R031 Nonspecific low blood-pressure reading: Secondary | ICD-10-CM | POA: Diagnosis not present

## 2023-04-08 DIAGNOSIS — M7062 Trochanteric bursitis, left hip: Secondary | ICD-10-CM | POA: Diagnosis not present

## 2023-04-09 ENCOUNTER — Ambulatory Visit: Payer: Medicare Other

## 2023-04-09 DIAGNOSIS — R279 Unspecified lack of coordination: Secondary | ICD-10-CM

## 2023-04-09 DIAGNOSIS — M6281 Muscle weakness (generalized): Secondary | ICD-10-CM

## 2023-04-09 DIAGNOSIS — R293 Abnormal posture: Secondary | ICD-10-CM

## 2023-04-09 DIAGNOSIS — M62838 Other muscle spasm: Secondary | ICD-10-CM | POA: Diagnosis not present

## 2023-04-09 DIAGNOSIS — M25552 Pain in left hip: Secondary | ICD-10-CM

## 2023-04-09 NOTE — Therapy (Signed)
OUTPATIENT PHYSICAL THERAPY TREATMENT NOTE   Patient Name: Mackenzie Green MRN: [id number] DOB:28-Feb-1946, 77 y.o., female Today's Date: 04/09/2023  PCP: Clayborn Heron, MD REFERRING PROVIDER: Moshe Cipro, NP  END OF SESSION:   PT End of Session - 04/09/23 0904     Visit Number 15    Date for PT Re-Evaluation 04/22/23    Authorization Type [employer]    PT Start Time 0900    PT Stop Time 0945    PT Time Calculation (min) 45 min    Activity Tolerance Patient tolerated treatment well    Behavior During Therapy WFL for tasks assessed/performed                     Past Medical History:  Diagnosis Date   Low iron    Mild cognitive impairment with memory loss    Mitral valve prolapse    Past Surgical History:  Procedure Laterality Date   TEAR DUCT PROBING     TOTAL HIP ARTHROPLASTY Left 03/27/2022   Procedure: LEFT POSTERIOR TOTAL HIP ARTHROPLASTY;  Surgeon: Joen Laura, MD;  Location: [facility] OR;  Service: Orthopedics;  Laterality: Left;   Patient Active Problem List   Diagnosis Date Noted   Hip fracture 03/26/2022   Mild cognitive impairment with memory loss 03/26/2022   Depression with anxiety 03/26/2022   Spinal stenosis at L4-L5 level 03/26/2022   Neutropenia 08/27/2016   Weight loss 07/31/2012   Hypokalemia 07/31/2012   Hyponatremia 07/30/2012   Hypoglycemia 07/30/2012    REFERRING DIAG: N81.10 (ICD-10-CM) - Cystocele, unspecified R15.9 (ICD-10-CM) - Full incontinence of feces  THERAPY DIAG:  Abnormal posture  Muscle weakness (generalized)  Unspecified lack of coordination  Pain in left hip  Other muscle spasm  Rationale for Evaluation and Treatment Rehabilitation  PERTINENT HISTORY: Lichens sclerosis, cystocele, hemorrhoids, diarrhea, Lt THA 04/25/22, spinal stenosis L4-5, osteopenia/osteoporosis  PRECAUTIONS: NA  SUBJECTIVE:                                                                                                                                                                                       SUBJECTIVE STATEMENT: Pt states that Lt hip is feeling much better. She has been more regular with bowel movements that last couple of days which does result in less incontinence. She still will have small pellets of stool without sensation.    PAIN:  Are you having pain? Yes: NPRS scale: 3/10 Pain location: Lt hip Pain description: stiff Aggravating factors: walking - unrelenting pain, standing Relieving factors: pain medication to some extent   11/19/22 SUBJECTIVE STATEMENT: Pt states that she has had issues with diarrhea  and fecal incontinence; this is better after getting a pessary about a month and a half ago. She was having vaginal bleeding and was told this was from bladder prolapse; she has not had this since pessary was placed. She also had series of UTIs and a yeast infection. She is working on trying to build bone density right now due to fall and Lt hip replacement. She has been doing pelvic floor contractions 4x/day in various positions until MD told her her muscles were too far gone to do any good, therefore she stopped. She states that she is a little overwhelmed with all of her exercises for bones/muscles. She does also go to the pool. She is active and motivated. She is having consistent yellow/brown discharge since wearing pessary - she has talked with MD about it. Her pessary was placed and will remain in place until her 3 month appointment.  Fluid intake: Yes: not discussed in detail     PAIN:  Are you having pain? Yes NPRS scale: no provided this session Pain location:  Lt hip pain   Pain type: aching Pain description: intermittent    Aggravating factors: walking Relieving factors: rest   PRECAUTIONS: None   WEIGHT BEARING RESTRICTIONS: No   FALLS:  Has patient fallen in last 6 months? No   LIVING ENVIRONMENT: Lives with: lives with their family Lives in: House/apartment      OCCUPATION: retired   PLOF: Independent   PATIENT GOALS: to strengthen pelvic floor    PERTINENT HISTORY:  Lichens sclerosis, cystocele, hemorrhoids, diarrhea, Lt THA 04/25/22, spinal stenosis L4-5, osteopenia/osteoporosis Sexual abuse: No   BOWEL MOVEMENT: Pain with bowel movement: No Type of bowel movement:Frequency sometimes multiple times a day, sometimes just once and Strain No Fully empty rectum: Yes: fees empty, but then she still has to go Leakage: No - not in the last several weeks besides occasional leakage - at time of referral she was having bowel movement leakage outside of control every time she urinated  Pads: Yes: - Fiber supplement: No *uses wash cloth to clean   URINATION: Pain with urination: No Fully empty bladder: Yes: she states that she tries to Stream: Strong Urgency: Yes: does not hold urine well Frequency: 2x/night; every hour - unchanged since she got pessary Leakage: Urge to void and Walking to the bathroom Pads: Yes: goes through several a day, mostly for discharge    INTERCOURSE: Pain with intercourse:  not sexually active currently; history of pain since menopause     PREGNANCY: Vaginal deliveries 2 Tearing Yes: forceps delivery ; significant tearing     PROLAPSE: Cystocele had to press bladder back inside vaginal canal prior to pessary     OBJECTIVE:  02/11/23: Palpation: severe tenderness, muscle spasm/trigger points, scar tissue restriction over Lt hip  Balance: unable to stand on Lt LE Hip strength:  Rt: grossly 5/5 Lt: flexion 4/5, adduction 3/5, abduction 2/5 and pain, IR 3/5 and pain, ER 3/5 and pain  Pelvic floor strength: (Rectal exam) Pelvic floor strength 3/5 Pelvic floor endurance 22 seconds Active lichens sclerosis plaques present around anus with skin irritation  01/07/23: Grade 3 anterior vaginal wall laxity without pessary Pelvic floor strength 2/5, external anal sphincter 2/5 Difficulty with appropriate  bearing down/bulge - able to perform balloon breathing with cuing  11/19/22:  COGNITION: Overall cognitive status: Within functional limits for tasks assessed  SENSATION: Light touch: Appears intact Proprioception: Appears intact   MUSCLE LENGTH:     FUNCTIONAL TESTS:      GAIT: Lt trendelenburg   POSTURE: rounded shoulders, forward head, decreased lumbar lordosis, increased thoracic kyphosis, and posterior pelvic tilt   LUMBARAROM/PROM:     LOWER EXTREMITY MMT:     PALPATION:   General  mild abdominal doming with increased abdominal pressure                 External Perineal Exam: palor in tissues, fusion of labia minora with labia majora, bright red urethral tissue, red irritated issue in vestibule                             Internal Pelvic Floor no tenderness, pessary present   Patient confirms identification and approves PT to assess internal pelvic floor and treatment Yes   PELVIC MMT:   MMT eval  Vaginal 2/5, endurance 3 seconds, repeat contractions 4x  Internal Anal Sphincter    External Anal Sphincter    Puborectalis    Diastasis Recti WNL  (Blank rows = not tested)         TONE: low   PROLAPSE: Pessary present, but anterior vaginal wall laxity still noted with bear down, no rectocele appreciated with pessary   TODAY'S TREATMENT 04/09/23 Neuromuscular re-education: Bil UE ball press 2 x 10 Single arm chop with light ball 2 x 10 bil Bil UE flexion in supine 2 x 10 Reclined sitting with rows 2 x 10 Resisted march supine 10x bil   TREATMENT 04/02/23 Manual: Trigger Point Dry-Needling  Treatment instructions: Expect mild to moderate muscle soreness. S/S of pneumothorax if dry needled over a lung field, and to seek immediate medical attention should they occur. Patient verbalized understanding of these instructions and education.  Patient Consent Given: Yes Education handout provided: Yes Muscles treated: Lt TFL, vastus  lateralis, adductors, and glutes Electrical stimulation performed: No Parameters: N/A Treatment response/outcome: twitch response and improved tension Instrument assisted soft tissue mobilization Lt hip Neuromuscular re-education: Supine ball to knee core facilitation Review core strengthening exercises Therapeutic activities: Education on RUSI for future treatment sessions   TREATMENT 03/28/23 Manual: Trigger Point Dry-Needling  Treatment instructions: Expect mild to moderate muscle soreness. S/S of pneumothorax if dry needled over a lung field, and to seek immediate medical attention should they occur. Patient verbalized understanding of these instructions and education.  Patient Consent Given: Yes Education handout provided: Yes Muscles treated: Lt TFL, vastus lateralis, adductors, and glutes Electrical stimulation performed: No Parameters: N/A Treatment response/outcome: twitch response and improved tension Instrument assisted soft tissue mobilization Lt hip Neuromuscular re-education: Transversus abdominus training with multimodal cues for improved motor control and breath coordination UE ball press 10x Sidelying ball press 10x Ball press into knee march 10x bil  PATIENT EDUCATION:  Education details: see above Person educated: Patient Education method: Programmer, multimedia, Demonstration, Tactile cues, Verbal cues, and Handouts Education comprehension: verbalized understanding   HOME EXERCISE PROGRAM: Access Code: [email or web address]     ASSESSMENT:   CLINICAL IMPRESSION: Pt doing very well with LT hip pain and has returned to many more normal activities. Fecal incontinence has been better with more consistent bowel movements. Believe that continuing with pressure management and pelvic floor strengthening will likely resolve majority of remaining incontinence, but at this time pt is not being consistent with core/pressure management exercise sat home. With focus of hip contribution for so  long,  these strengthening/motor control activities were delayed. We discussed this today and she was encouraged to work on these activities more at home. She is not sure she wants to continue PT for pelvic floor as she would like to just get back to normal life. She will continue to benefit from skilled PT intervention in order to decrease Lt hip pain, improve urinary/bowel symptoms, perform pelvic floor strengthening program, and incorporate pelvic floor into functional strengthening with appropriate pressure management.    OBJECTIVE IMPAIRMENTS: Abnormal gait, decreased activity tolerance, decreased coordination, decreased endurance, decreased strength, increased fascial restrictions, increased muscle spasms, impaired tone, postural dysfunction, and pain.    ACTIVITY LIMITATIONS: continence   PARTICIPATION LIMITATIONS: community activity   PERSONAL FACTORS: 3+ comorbidities: Lichens sclerosis, cystocele, hemorrhoids, diarrhea, Lt THA 04/25/22, spinal stenosis L4-5, osteopenia/osteoporosis  are also affecting patient's functional outcome.    REHAB POTENTIAL: Good   CLINICAL DECISION MAKING: Stable/uncomplicated   EVALUATION COMPLEXITY: Low     GOALS: Goals reviewed with patient? Yes   SHORT TERM GOALS: Target date: 12/17/22 - updated 12/25/22 - updated 02/05/23 new goal 04/22/2023    Pt will be independent with HEP.    Baseline: Goal status: MET 02/11/23   2.  Pt will be able to correctly perform diaphragmatic breathing and appropriate pressure management in order to prevent worsening vaginal wall laxity and improve pelvic floor A/ROM.    Baseline:  Goal status: Met 02/11/23   3.  Pt will be independent with the knack, urge suppression technique, and double voiding in order to improve bladder habits and decrease urinary/fecal incontinence.    Baseline:  Goal status: IN PROGRESS     LONG TERM GOALS: Target date: 01/28/22 - updated 12/25/22 - updated 02/05/23 new goal 04/22/2023    Pt will  be independent with advanced HEP.    Baseline:  Goal status: IN PROGRESS   2.  Pt will demonstrate normal pelvic floor muscle tone and A/ROM, able to achieve 3/5 strength with contractions and 10 sec endurance, in order to provide appropriate lumbopelvic support in functional activities.    Baseline:  Goal status: MET 02/11/23   3.  Pt will report improved sensation of control over urgency and continence with bowel movements without any episodes of fecal incontinence.  Baseline:  Goal status: IIN PROGRESS   4.  Pt will be able to go 2-3 hours in between voids without urgency or incontinence in order to improve QOL and perform all functional activities with less difficulty.    Baseline:  Goal status: IN PROGRESS  5. Pt will demonstrate normal pelvic floor muscle tone and A/ROM, able to achieve 4/5 strength with contractions and 30 sec endurance, in order to provide appropriate lumbopelvic support in functional activities.    Baseline:  Goal status: INITIAL  6. Pt will report no higher Lt hip pain during functional activities than 2/10 and be able to stand/walk for greater than 30 minutes without difficulty.    Baseline:  Goal status: INITIAL     PLAN:   PT FREQUENCY: 1x/week   PT DURATION: 10 weeks   PLANNED INTERVENTIONS: Therapeutic exercises, Therapeutic activity, Neuromuscular re-education, Balance training, Gait training, Patient/Family education, Self Care, Joint mobilization, Dry Needling, Biofeedback, and Manual therapy   PLAN FOR NEXT SESSION: Progress core strengthening to seated and standing positions - work on facilitation of pelvic floor with core.      Julio Alm, PT, DPT04/17/249:54 AM

## 2023-04-10 DIAGNOSIS — M7062 Trochanteric bursitis, left hip: Secondary | ICD-10-CM | POA: Diagnosis not present

## 2023-04-15 DIAGNOSIS — D225 Melanocytic nevi of trunk: Secondary | ICD-10-CM | POA: Diagnosis not present

## 2023-04-15 DIAGNOSIS — L905 Scar conditions and fibrosis of skin: Secondary | ICD-10-CM | POA: Diagnosis not present

## 2023-04-15 DIAGNOSIS — L738 Other specified follicular disorders: Secondary | ICD-10-CM | POA: Diagnosis not present

## 2023-04-15 DIAGNOSIS — L570 Actinic keratosis: Secondary | ICD-10-CM | POA: Diagnosis not present

## 2023-04-15 DIAGNOSIS — L853 Xerosis cutis: Secondary | ICD-10-CM | POA: Diagnosis not present

## 2023-04-15 DIAGNOSIS — Z85828 Personal history of other malignant neoplasm of skin: Secondary | ICD-10-CM | POA: Diagnosis not present

## 2023-04-15 DIAGNOSIS — D2261 Melanocytic nevi of right upper limb, including shoulder: Secondary | ICD-10-CM | POA: Diagnosis not present

## 2023-04-15 DIAGNOSIS — D2271 Melanocytic nevi of right lower limb, including hip: Secondary | ICD-10-CM | POA: Diagnosis not present

## 2023-04-15 DIAGNOSIS — D2262 Melanocytic nevi of left upper limb, including shoulder: Secondary | ICD-10-CM | POA: Diagnosis not present

## 2023-04-15 DIAGNOSIS — L821 Other seborrheic keratosis: Secondary | ICD-10-CM | POA: Diagnosis not present

## 2023-04-15 DIAGNOSIS — D2272 Melanocytic nevi of left lower limb, including hip: Secondary | ICD-10-CM | POA: Diagnosis not present

## 2023-04-15 DIAGNOSIS — L603 Nail dystrophy: Secondary | ICD-10-CM | POA: Diagnosis not present

## 2023-04-16 ENCOUNTER — Ambulatory Visit: Payer: Medicare Other

## 2023-04-16 DIAGNOSIS — M62838 Other muscle spasm: Secondary | ICD-10-CM

## 2023-04-16 DIAGNOSIS — M25552 Pain in left hip: Secondary | ICD-10-CM | POA: Diagnosis not present

## 2023-04-16 DIAGNOSIS — R293 Abnormal posture: Secondary | ICD-10-CM | POA: Diagnosis not present

## 2023-04-16 DIAGNOSIS — M6281 Muscle weakness (generalized): Secondary | ICD-10-CM | POA: Diagnosis not present

## 2023-04-16 DIAGNOSIS — R279 Unspecified lack of coordination: Secondary | ICD-10-CM

## 2023-04-16 NOTE — Therapy (Signed)
OUTPATIENT PHYSICAL THERAPY TREATMENT NOTE   Patient Name: Mackenzie Green MRN: [id number] DOB:May 15, 1946, 77 y.o., female Today's Date: 04/16/2023  PCP: Clayborn Heron, MD REFERRING PROVIDER: Moshe Cipro, NP  END OF SESSION:   PT End of Session - 04/16/23 0931     Visit Number [id number]    Date for PT Re-Evaluation 04/22/23    Authorization Type [employer]    PT Start Time 0928    PT Stop Time 1010    PT Time Calculation (min) 42 min    Activity Tolerance Patient tolerated treatment well    Behavior During Therapy WFL for tasks assessed/performed                     Past Medical History:  Diagnosis Date   Low iron    Mild cognitive impairment with memory loss    Mitral valve prolapse    Past Surgical History:  Procedure Laterality Date   TEAR DUCT PROBING     TOTAL HIP ARTHROPLASTY Left 03/27/2022   Procedure: LEFT POSTERIOR TOTAL HIP ARTHROPLASTY;  Surgeon: Joen Laura, MD;  Location: [facility] OR;  Service: Orthopedics;  Laterality: Left;   Patient Active Problem List   Diagnosis Date Noted   Hip fracture 03/26/2022   Mild cognitive impairment with memory loss 03/26/2022   Depression with anxiety 03/26/2022   Spinal stenosis at L4-L5 level 03/26/2022   Neutropenia 08/27/2016   Weight loss 07/31/2012   Hypokalemia 07/31/2012   Hyponatremia 07/30/2012   Hypoglycemia 07/30/2012    REFERRING DIAG: N81.10 (ICD-10-CM) - Cystocele, unspecified R15.9 (ICD-10-CM) - Full incontinence of feces  THERAPY DIAG:  Abnormal posture  Muscle weakness (generalized)  Unspecified lack of coordination  Pain in left hip  Other muscle spasm  Rationale for Evaluation and Treatment Rehabilitation  PERTINENT HISTORY: Lichens sclerosis, cystocele, hemorrhoids, diarrhea, 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 She reports some improvement in fecal incontinence in the last week and bowel movements have been more consistent. Lt hip is more sore today after going to [employer] yesterday.    PAIN:  Are you having pain? Yes: NPRS scale: 4/10 Pain location: Lt hip Pain description: stiff Aggravating factors: walking - unrelenting pain, standing Relieving factors: pain medication to some extent   11/19/22 SUBJECTIVE STATEMENT:  Pt states that she has had issues with diarrhea and fecal incontinence; this is better after getting a pessary about a month and a half ago. She was having vaginal bleeding and was told this was from bladder prolapse; she has not had this since pessary was placed. She also had series of UTIs and a yeast infection. She is working on trying to build bone density right now due to fall and Lt hip replacement. She has been doing pelvic floor contractions 4x/day in various positions until MD told her her muscles were too far gone to do any good, therefore she stopped. She states that she is a little overwhelmed with all of her exercises for bones/muscles. She does also go to the pool. She is active and motivated. She is having consistent yellow/brown discharge since wearing pessary - she has talked with MD about it. Her pessary was placed and will remain in place until her 3 month appointment.  Fluid intake: Yes: not discussed in detail     PAIN:  Are you having pain? Yes NPRS scale: no provided this session Pain location:  Lt hip pain   Pain type: aching Pain description: intermittent    Aggravating factors: walking Relieving factors: rest   PRECAUTIONS: None   WEIGHT BEARING RESTRICTIONS: No   FALLS:  Has patient fallen in last 6 months? No   LIVING  ENVIRONMENT: Lives with: lives with their family Lives in: House/apartment     OCCUPATION: retired   PLOF: Independent   PATIENT GOALS: to strengthen pelvic floor    PERTINENT HISTORY:  Lichens sclerosis, cystocele, hemorrhoids, diarrhea, Lt THA 04/25/22, spinal stenosis L4-5, osteopenia/osteoporosis Sexual abuse: No   BOWEL MOVEMENT: Pain with bowel movement: No Type of bowel movement:Frequency sometimes multiple times a day, sometimes just once and Strain No Fully empty rectum: Yes: fees empty, but then she still has to go Leakage: No - not in the last several weeks besides occasional leakage - at time of referral she was having bowel movement leakage outside of control every time she urinated  Pads: Yes: - Fiber supplement: No *uses wash cloth to clean   URINATION: Pain with urination: No Fully empty bladder: Yes: she states that she tries to Stream: Strong Urgency: Yes: does not hold urine well Frequency: 2x/night; every hour - unchanged since she got pessary Leakage: Urge to void and Walking to the bathroom Pads: Yes: goes through several a day, mostly for discharge    INTERCOURSE: Pain with intercourse:  not sexually active currently; history of pain since menopause     PREGNANCY: Vaginal deliveries 2 Tearing Yes: forceps delivery ; significant tearing     PROLAPSE: Cystocele had to press bladder back inside vaginal canal prior to pessary     OBJECTIVE:  02/11/23: Palpation: severe tenderness, muscle spasm/trigger points, scar tissue restriction over Lt hip  Balance: unable to stand on Lt LE Hip strength:  Rt: grossly 5/5 Lt: flexion 4/5, adduction 3/5, abduction 2/5 and pain, IR 3/5 and pain, ER 3/5 and pain  Pelvic floor strength: (Rectal exam) Pelvic floor strength 3/5 Pelvic floor endurance 22 seconds Active lichens sclerosis plaques present around anus with skin irritation  01/07/23: Grade 3 anterior vaginal wall laxity without pessary Pelvic  floor strength 2/5, external anal sphincter 2/5 Difficulty with appropriate bearing down/bulge - able to perform balloon breathing with cuing  11/19/22:  COGNITION: Overall cognitive status: Within functional limits for tasks assessed  SENSATION: Light touch: Appears intact Proprioception: Appears intact   MUSCLE LENGTH:     FUNCTIONAL TESTS:      GAIT: Lt trendelenburg   POSTURE: rounded shoulders, forward head, decreased lumbar lordosis, increased thoracic kyphosis, and posterior pelvic tilt   LUMBARAROM/PROM:     LOWER EXTREMITY MMT:     PALPATION:   General  mild abdominal doming with increased abdominal pressure                 External Perineal Exam: palor in tissues, fusion of labia minora with labia majora, bright red urethral tissue, red irritated issue in vestibule                             Internal Pelvic Floor no tenderness, pessary present   Patient confirms identification and approves PT to assess internal pelvic floor and treatment Yes   PELVIC MMT:   MMT eval  Vaginal 2/5, endurance 3 seconds, repeat contractions 4x  Internal Anal Sphincter    External Anal Sphincter    Puborectalis    Diastasis Recti WNL  (Blank rows = not tested)         TONE: low   PROLAPSE: Pessary present, but anterior vaginal wall laxity still noted with bear down, no rectocele appreciated with pessary   TODAY'S TREATMENT 04/16/23 Manual: Trigger Point Dry-Needling  Treatment instructions: Expect mild to moderate muscle soreness. S/S of pneumothorax if dry needled over a lung field, and to seek immediate medical attention should they occur. Patient verbalized understanding of these instructions and education.  Patient Consent Given: Yes Education handout provided: Yes Muscles treated: Lt TFL, vastus lateralis, adductors, and glutes Electrical stimulation performed: No Parameters: N/A Treatment response/outcome: twitch response and improved  tension Instrument assisted soft tissue mobilization Lt hip Neuromuscular re-education: Lower trunk rotation in swiss ball table top Cat cow with multimodal cues for better core facilitation during lift Quadruped UE ball press for core facilitation   TREATMENT 04/09/23 Neuromuscular re-education: Bil UE ball press 2 x 10 Single arm chop with light ball 2 x 10 bil Bil UE flexion in supine 2 x 10 Reclined sitting with rows 2 x 10 Resisted march supine 10x bil   TREATMENT 04/02/23 Manual: Trigger Point Dry-Needling  Treatment instructions: Expect mild to moderate muscle soreness. S/S of pneumothorax if dry needled over a lung field, and to seek immediate medical attention should they occur. Patient verbalized understanding of these instructions and education.  Patient Consent Given: Yes Education handout provided: Yes Muscles treated: Lt TFL, vastus lateralis, adductors, and glutes Electrical stimulation performed: No Parameters: N/A Treatment response/outcome: twitch response and improved tension Instrument assisted soft tissue mobilization Lt hip Neuromuscular re-education: Supine ball to knee core facilitation Review core strengthening exercises Therapeutic activities: Education on RUSI for future treatment sessions    PATIENT EDUCATION:  Education details: see above Person educated: Patient Education method: Programmer, multimedia, Demonstration, Tactile cues, Verbal cues, and Handouts Education comprehension: verbalized understanding   HOME EXERCISE PROGRAM: Access Code: [email or web address]     ASSESSMENT:   CLINICAL IMPRESSION: Pt has seen some progress with fecal incontinence in the last week, likely due to focusing on pelvic floor/core strengthening more and having more consistent bowel movements. She did well with all strengthening progressions this session with emphasis on improved core facilitation and breath coordination. Good tolerance to all manual techniques and DN with  decreased pain to 1/10. She will continue to benefit from skilled PT  intervention in order to decrease Lt hip pain, improve urinary/bowel symptoms, perform pelvic floor strengthening program, and incorporate pelvic floor into functional strengthening with appropriate pressure management.    OBJECTIVE IMPAIRMENTS: Abnormal gait, decreased activity tolerance, decreased coordination, decreased endurance, decreased strength, increased fascial restrictions, increased muscle spasms, impaired tone, postural dysfunction, and pain.    ACTIVITY LIMITATIONS: continence   PARTICIPATION LIMITATIONS: community activity   PERSONAL FACTORS: 3+ comorbidities: Lichens sclerosis, cystocele, hemorrhoids, diarrhea, Lt THA 04/25/22, spinal stenosis L4-5, osteopenia/osteoporosis  are also affecting patient's functional outcome.    REHAB POTENTIAL: Good   CLINICAL DECISION MAKING: Stable/uncomplicated   EVALUATION COMPLEXITY: Low     GOALS: Goals reviewed with patient? Yes   SHORT TERM GOALS: Target date: 12/17/22 - updated 12/25/22 - updated 02/05/23 new goal 04/22/2023    Pt will be independent with HEP.    Baseline: Goal status: MET 02/11/23   2.  Pt will be able to correctly perform diaphragmatic breathing and appropriate pressure management in order to prevent worsening vaginal wall laxity and improve pelvic floor A/ROM.    Baseline:  Goal status: Met 02/11/23   3.  Pt will be independent with the knack, urge suppression technique, and double voiding in order to improve bladder habits and decrease urinary/fecal incontinence.    Baseline:  Goal status: IN PROGRESS     LONG TERM GOALS: Target date: 01/28/22 - updated 12/25/22 - updated 02/05/23 new goal 04/22/2023    Pt will be independent with advanced HEP.    Baseline:  Goal status: IN PROGRESS   2.  Pt will demonstrate normal pelvic floor muscle tone and A/ROM, able to achieve 3/5 strength with contractions and 10 sec endurance, in order to provide  appropriate lumbopelvic support in functional activities.    Baseline:  Goal status: MET 02/11/23   3.  Pt will report improved sensation of control over urgency and continence with bowel movements without any episodes of fecal incontinence.  Baseline:  Goal status: IIN PROGRESS   4.  Pt will be able to go 2-3 hours in between voids without urgency or incontinence in order to improve QOL and perform all functional activities with less difficulty.    Baseline:  Goal status: IN PROGRESS  5. Pt will demonstrate normal pelvic floor muscle tone and A/ROM, able to achieve 4/5 strength with contractions and 30 sec endurance, in order to provide appropriate lumbopelvic support in functional activities.    Baseline:  Goal status: INITIAL  6. Pt will report no higher Lt hip pain during functional activities than 2/10 and be able to stand/walk for greater than 30 minutes without difficulty.    Baseline:  Goal status: INITIAL     PLAN:   PT FREQUENCY: 1x/week   PT DURATION: 10 weeks   PLANNED INTERVENTIONS: Therapeutic exercises, Therapeutic activity, Neuromuscular re-education, Balance training, Gait training, Patient/Family education, Self Care, Joint mobilization, Dry Needling, Biofeedback, and Manual therapy   PLAN FOR NEXT SESSION: Re-evaluate next session. Ask patient if she is still using the clobetasol cream.      Julio Alm, PT, DPT04/24/2410:38 AM

## 2023-04-22 NOTE — Telephone Encounter (Signed)
LM on the VM for the patient to call back 

## 2023-04-23 ENCOUNTER — Ambulatory Visit: Payer: Medicare Other | Attending: Family Medicine

## 2023-04-23 DIAGNOSIS — R279 Unspecified lack of coordination: Secondary | ICD-10-CM

## 2023-04-23 DIAGNOSIS — M6281 Muscle weakness (generalized): Secondary | ICD-10-CM

## 2023-04-23 DIAGNOSIS — R293 Abnormal posture: Secondary | ICD-10-CM | POA: Diagnosis not present

## 2023-04-23 DIAGNOSIS — M25552 Pain in left hip: Secondary | ICD-10-CM | POA: Diagnosis not present

## 2023-04-23 DIAGNOSIS — M62838 Other muscle spasm: Secondary | ICD-10-CM

## 2023-04-23 NOTE — Therapy (Signed)
OUTPATIENT PHYSICAL THERAPY TREATMENT NOTE   Patient Name: Mackenzie Green MRN: [id number] DOB:1946/01/05, 77 y.o., female Today's Date: 04/23/2023  PCP: Clayborn Heron, MD REFERRING PROVIDER: Moshe Cipro, NP  END OF SESSION:   PT End of Session - 04/23/23 1446     Visit Number 17    Date for PT Re-Evaluation 07/02/23    Authorization Type [employer]    PT Start Time 1445    PT Stop Time 1525    PT Time Calculation (min) 40 min    Activity Tolerance Patient tolerated treatment well    Behavior During Therapy WFL for tasks assessed/performed                     Past Medical History:  Diagnosis Date   Low iron    Mild cognitive impairment with memory loss    Mitral valve prolapse    Past Surgical History:  Procedure Laterality Date   TEAR DUCT PROBING     TOTAL HIP ARTHROPLASTY Left 03/27/2022   Procedure: LEFT POSTERIOR TOTAL HIP ARTHROPLASTY;  Surgeon: Joen Laura, MD;  Location: [facility] OR;  Service: Orthopedics;  Laterality: Left;   Patient Active Problem List   Diagnosis Date Noted   Hip fracture (HCC) 03/26/2022   Mild cognitive impairment with memory loss 03/26/2022   Depression with anxiety 03/26/2022   Spinal stenosis at L4-L5 level 03/26/2022   Neutropenia (HCC) 08/27/2016   Weight loss 07/31/2012   Hypokalemia 07/31/2012   Hyponatremia 07/30/2012   Hypoglycemia 07/30/2012    REFERRING DIAG: N81.10 (ICD-10-CM) - Cystocele, unspecified R15.9 (ICD-10-CM) - Full incontinence of feces  THERAPY DIAG:  Abnormal posture  Muscle weakness (generalized)  Unspecified lack of coordination  Pain in left hip  Other muscle spasm  Rationale for Evaluation and Treatment Rehabilitation  PERTINENT HISTORY: Lichens sclerosis, cystocele, hemorrhoids, diarrhea, 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 She is thinking that the diarrhea is causing her UTIs. She notices more urinary urgency when she has episodes of fecal incontinence. She states that she has had diarrhea like this since she was in college.    PAIN:  Are you having pain? Yes: NPRS scale: 4/10 Pain location: Lt hip Pain description: stiff Aggravating factors: walking - unrelenting pain, standing Relieving factors: pain medication to some extent  11/19/22 SUBJECTIVE STATEMENT: Pt states that she has had issues with diarrhea and fecal incontinence; this is better after getting a pessary about a month and a half ago. She was having vaginal bleeding and was told this was from bladder prolapse; she has not had this since pessary was placed. She also had series of UTIs and a yeast infection. She is working on trying to build bone density right now due to fall and Lt hip replacement. She has been doing pelvic floor contractions 4x/day in various positions until MD told her her muscles were too far gone to do any good, therefore she stopped. She states that she is a little overwhelmed with all of her exercises for bones/muscles. She does also go to the pool. She is active and motivated. She is having consistent yellow/brown discharge since wearing pessary - she has talked with MD about it. Her pessary was placed and will remain in place until her 3 month appointment.  Fluid intake: Yes: not discussed in detail     PAIN:  Are you having pain? Yes NPRS scale: no provided this session Pain location:  Lt hip pain   Pain type: aching Pain description: intermittent    Aggravating factors: walking Relieving factors: rest   PRECAUTIONS: None   WEIGHT BEARING RESTRICTIONS: No   FALLS:  Has patient fallen in last 6 months? No   LIVING ENVIRONMENT: Lives  with: lives with their family Lives in: House/apartment     OCCUPATION: retired   PLOF: Independent   PATIENT GOALS: to strengthen pelvic floor    PERTINENT HISTORY:  Lichens sclerosis, cystocele, hemorrhoids, diarrhea, Lt THA 04/25/22, spinal stenosis L4-5, osteopenia/osteoporosis Sexual abuse: No   BOWEL MOVEMENT: Pain with bowel movement: No Type of bowel movement:Frequency sometimes multiple times a day, sometimes just once and Strain No Fully empty rectum: Yes: fees empty, but then she still has to go Leakage: No - not in the last several weeks besides occasional leakage - at time of referral she was having bowel movement leakage outside of control every time she urinated  Pads: Yes: - Fiber supplement: No *uses wash cloth to clean   URINATION: Pain with urination: No Fully empty bladder: Yes: she states that she tries to Stream: Strong Urgency: Yes: does not hold urine well Frequency: 2x/night; every hour - unchanged since she got pessary Leakage: Urge to void and Walking to the bathroom Pads: Yes: goes through several a day, mostly for discharge    INTERCOURSE: Pain with intercourse:  not sexually active currently; history of pain since menopause     PREGNANCY: Vaginal deliveries 2 Tearing Yes: forceps delivery ; significant tearing     PROLAPSE: Cystocele had to press bladder back inside vaginal canal prior to pessary     OBJECTIVE:  04/23/23 Internal vaginal exam:  Strength 3/5 Endurance 10 seconds, holding longer she decreases contraction with inhale and then re-squeezes with exhale, cannot hold throughout inhale  Internal rectal exam: Lichens plaque noted Rt peri-anal area EAS 3/5 Pelvic floor 3/5 Bulge: initially paradoxical contraction; with cuing corrected and performed very well  02/11/23: Palpation: severe tenderness, muscle spasm/trigger points, scar tissue restriction over Lt hip  Balance: unable to stand on Lt LE Hip strength:  Rt: grossly  5/5 Lt: flexion 4/5, adduction 3/5, abduction 2/5 and pain, IR 3/5 and pain, ER 3/5 and pain  Pelvic floor strength: (Rectal exam) Pelvic floor strength 3/5 Pelvic floor endurance 22 seconds Active lichens sclerosis plaques present around anus with skin irritation  01/07/23: Grade 3 anterior vaginal wall laxity without pessary Pelvic floor strength 2/5, external anal sphincter 2/5 Difficulty with appropriate bearing down/bulge - able to perform balloon breathing with cuing  11/19/22:  COGNITION: Overall cognitive status: Within functional limits for tasks assessed                          SENSATION: Light touch: Appears intact Proprioception: Appears intact   MUSCLE LENGTH:     FUNCTIONAL TESTS:      GAIT: Lt trendelenburg   POSTURE: rounded shoulders, forward head, decreased lumbar lordosis, increased thoracic kyphosis, and posterior pelvic tilt   LUMBARAROM/PROM:     LOWER EXTREMITY MMT:     PALPATION:   General  mild abdominal doming with increased abdominal pressure                 External Perineal Exam: palor in tissues, fusion of labia minora with labia majora, bright red urethral tissue, red irritated issue in vestibule                             Internal Pelvic Floor no tenderness, pessary present   Patient confirms identification and approves PT to assess internal pelvic floor and treatment Yes   PELVIC MMT:   MMT eval  Vaginal 2/5, endurance 3 seconds, repeat contractions 4x  Internal Anal Sphincter    External Anal Sphincter    Puborectalis    Diastasis Recti WNL  (Blank rows = not tested)         TONE: low   PROLAPSE: Pessary present, but anterior vaginal wall laxity still noted with bear down, no rectocele appreciated with pessary   TODAY'S TREATMENT 04/23/23 REVAL Manual: Pt provides verbal consent for internal vaginal/rectal pelvic floor exam. Internal vaginal and rectal pelvic floor muscle re-assessment Neuromuscular  re-education: Pelvic floor contraction training vaginally/rectally Quick flicks Long holds - working on 30 second effort holds   TREATMENT 04/16/23 Manual: Trigger Point Dry-Needling  Treatment instructions: Expect mild to moderate muscle soreness. S/S of pneumothorax if dry needled over a lung field, and to seek immediate medical attention should they occur. Patient verbalized understanding of these instructions and education.  Patient Consent Given: Yes Education handout provided: Yes Muscles treated: Lt TFL, vastus lateralis, adductors, and glutes Electrical stimulation performed: No Parameters: N/A Treatment response/outcome: twitch response and improved tension Instrument assisted soft tissue mobilization Lt hip Neuromuscular re-education: Lower trunk rotation in swiss ball table top Cat cow with multimodal cues for better core facilitation during lift Quadruped UE ball press for core facilitation   TREATMENT 04/09/23 Neuromuscular re-education: Bil UE ball press 2 x 10 Single arm chop with light ball 2 x 10 bil Bil UE flexion in supine 2 x 10 Reclined sitting with rows 2 x 10 Resisted march supine 10x bil   PATIENT EDUCATION:  Education details: see above Person educated: Patient Education method: Explanation, Demonstration, Tactile cues, Verbal cues, and Handouts Education comprehension: verbalized understanding   HOME EXERCISE PROGRAM: Access Code: [email or web address]     ASSESSMENT:   CLINICAL IMPRESSION: Pt continues to do better with Lt hip pain and functional ability. Unfortunately, she is not seeing progress with fecal incontinence. Am uncertain if she will see progress without improvement of bowel movement consistency, which is still diarrhea. She reports that she has had this issue with diarrhea since she was in college. Pelvic floor muscle exam performed today with findings  of unchanged pelvic floor strength vaginally and rectally of 3/5, improved pelvic floor  endurance 10 seconds; with pelvic floor endurance she has difficulty staying contracted with inhale and needs to work on this coordination. With balloon breathing/bulge, she initially had paradoxical contraction; could be inhibiting complete emptying with bowel movements. She was able to immediately correct with multimodal cues and coordinated a good bulge. She was encouraged to do this with bowel movements to help complete emptying. We will continue to work on progressing pelvic floor muscle endurance as coordination improves. HEP updated to 30 second long holds for pelvic floor. She will continue to benefit from skilled PT intervention in order to decrease Lt hip pain, improve urinary/bowel symptoms, perform pelvic floor strengthening program, and incorporate pelvic floor into functional strengthening with appropriate pressure management.    OBJECTIVE IMPAIRMENTS: Abnormal gait, decreased activity tolerance, decreased coordination, decreased endurance, decreased strength, increased fascial restrictions, increased muscle spasms, impaired tone, postural dysfunction, and pain.    ACTIVITY LIMITATIONS: continence   PARTICIPATION LIMITATIONS: community activity   PERSONAL FACTORS: 3+ comorbidities: Lichens sclerosis, cystocele, hemorrhoids, diarrhea, Lt THA 04/25/22, spinal stenosis L4-5, osteopenia/osteoporosis  are also affecting patient's functional outcome.    REHAB POTENTIAL: Good   CLINICAL DECISION MAKING: Stable/uncomplicated   EVALUATION COMPLEXITY: Low     GOALS: Goals reviewed with patient? Yes   SHORT TERM GOALS: Target date: 12/17/22 - updated 12/25/22 - updated 02/05/23 new goal 04/22/2023  - updated 04/23/23 with new goal 07/02/23   Pt will be independent with HEP.    Baseline: Goal status: MET 02/11/23   2.  Pt will be able to correctly perform diaphragmatic breathing and appropriate pressure management in order to prevent worsening vaginal wall laxity and improve pelvic floor A/ROM.     Baseline:  Goal status: Met 02/11/23   3.  Pt will be independent with the knack, urge suppression technique, and double voiding in order to improve bladder habits and decrease urinary/fecal incontinence.    Baseline:  Goal status: MET 04/23/23     LONG TERM GOALS: Target date: 01/28/22 - updated 12/25/22 - updated 02/05/23 new goal 04/22/2023 - updated 04/23/23 with new goal 07/02/23   Pt will be independent with advanced HEP.    Baseline:  Goal status: IN PROGRESS   2.  Pt will demonstrate normal pelvic floor muscle tone and A/ROM, able to achieve 3/5 strength with contractions and 10 sec endurance, in order to provide appropriate lumbopelvic support in functional activities.    Baseline:  Goal status: MET 02/11/23   3.  Pt will report improved sensation of control over urgency and continence with bowel movements without any episodes of fecal incontinence.  Baseline:  Goal status: IIN PROGRESS   4.  Pt will be able to go 2-3 hours in between voids without urgency or incontinence in order to improve QOL and perform all functional activities with less difficulty.    Baseline:  Goal status: IN PROGRESS  5. Pt will demonstrate normal pelvic floor muscle tone and A/ROM, able to achieve 4/5 strength with contractions and 30 sec endurance, in order to provide appropriate lumbopelvic support in functional activities.    Baseline:  Goal status: IN PROGRESS  6. Pt will report no higher Lt hip pain during functional activities than 2/10 and be able to stand/walk for greater than 30 minutes without difficulty.    Baseline:  Goal status: IN PROGRESS     PLAN:   PT FREQUENCY: 1x/week   PT DURATION: 10 weeks  PLANNED INTERVENTIONS: Therapeutic exercises, Therapeutic activity, Neuromuscular re-education, Balance training, Gait training, Patient/Family education, Self Care, Joint mobilization, Dry Needling, Biofeedback, and Manual therapy   PLAN FOR NEXT SESSION: Revisit pelvic floor long  hold coordination. Progress core activities.      Julio Alm, PT, DPT05/01/244:19 PM

## 2023-04-28 NOTE — Telephone Encounter (Signed)
Pt has been seen since this message KW CMA

## 2023-04-30 ENCOUNTER — Ambulatory Visit: Payer: Medicare Other

## 2023-05-06 DIAGNOSIS — R600 Localized edema: Secondary | ICD-10-CM | POA: Diagnosis not present

## 2023-05-06 DIAGNOSIS — Z681 Body mass index (BMI) 19 or less, adult: Secondary | ICD-10-CM | POA: Diagnosis not present

## 2023-05-07 ENCOUNTER — Other Ambulatory Visit (HOSPITAL_COMMUNITY): Payer: Self-pay | Admitting: Family Medicine

## 2023-05-07 DIAGNOSIS — R600 Localized edema: Secondary | ICD-10-CM

## 2023-05-14 ENCOUNTER — Ambulatory Visit: Payer: Medicare Other

## 2023-05-14 DIAGNOSIS — R279 Unspecified lack of coordination: Secondary | ICD-10-CM | POA: Diagnosis not present

## 2023-05-14 DIAGNOSIS — M6281 Muscle weakness (generalized): Secondary | ICD-10-CM | POA: Diagnosis not present

## 2023-05-14 DIAGNOSIS — M62838 Other muscle spasm: Secondary | ICD-10-CM

## 2023-05-14 DIAGNOSIS — R293 Abnormal posture: Secondary | ICD-10-CM | POA: Diagnosis not present

## 2023-05-14 DIAGNOSIS — M25552 Pain in left hip: Secondary | ICD-10-CM

## 2023-05-14 NOTE — Therapy (Signed)
OUTPATIENT PHYSICAL THERAPY TREATMENT NOTE   Patient Name: Mackenzie Green MRN: [id number] DOB:[birth year], 77 y.o., female Today's Date: 05/14/2023  PCP: Clayborn Heron, MD REFERRING PROVIDER: Moshe Cipro, NP  END OF SESSION:   PT End of Session - 05/14/23 1359     Visit Number 18    Date for PT Re-Evaluation 07/02/23    Authorization Type [employer]    PT Start Time 1400    PT Stop Time 1443    PT Time Calculation (min) 43 min    Activity Tolerance Patient tolerated treatment well    Behavior During Therapy WFL for tasks assessed/performed                     Past Medical History:  Diagnosis Date   Low iron    Mild cognitive impairment with memory loss    Mitral valve prolapse    Past Surgical History:  Procedure Laterality Date   TEAR DUCT PROBING     TOTAL HIP ARTHROPLASTY Left 03/27/2022   Procedure: LEFT POSTERIOR TOTAL HIP ARTHROPLASTY;  Surgeon: Joen Laura, MD;  Location: [facility] OR;  Service: Orthopedics;  Laterality: Left;   Patient Active Problem List   Diagnosis Date Noted   Hip fracture (HCC) 03/26/2022   Mild cognitive impairment with memory loss 03/26/2022   Depression with anxiety 03/26/2022   Spinal stenosis at L4-L5 level 03/26/2022   Neutropenia (HCC) 08/27/2016   Weight loss 07/31/2012   Hypokalemia 07/31/2012   Hyponatremia 07/30/2012   Hypoglycemia 07/30/2012    REFERRING DIAG: N81.10 (ICD-10-CM) - Cystocele, unspecified R15.9 (ICD-10-CM) - Full incontinence of feces  THERAPY DIAG:  Abnormal posture  Muscle weakness (generalized)  Unspecified lack of coordination  Other muscle spasm  Pain in left hip  Rationale for Evaluation and Treatment Rehabilitation  PERTINENT HISTORY: Lichens sclerosis, cystocele, hemorrhoids, diarrhea, Lt THA 04/25/22, spinal stenosis L4-5, osteopenia/osteoporosis  PRECAUTIONS: NA  SUBJECTIVE:                                                                                                                                                                                       SUBJECTIVE STATEMENT: Pt states that she is walking better, but she does feel her Lt hip tighten up when she's sitting in the car and doing hip strengthening exercises. She was able to go for an hour long walk this morning.   Pt is still having vaginal bleeding and frequent UTIs/yeast infection. No changes in fecal incontinence.    PAIN:  Are you having pain? Yes: NPRS scale: 2/10 Pain location: Lt hip Pain description: stiff Aggravating factors: walking - unrelenting pain, standing Relieving  factors: pain medication to some extent   11/19/22 SUBJECTIVE STATEMENT: Pt states that she has had issues with diarrhea and fecal incontinence; this is better after getting a pessary about a month and a half ago. She was having vaginal bleeding and was told this was from bladder prolapse; she has not had this since pessary was placed. She also had series of UTIs and a yeast infection. She is working on trying to build bone density right now due to fall and Lt hip replacement. She has been doing pelvic floor contractions 4x/day in various positions until MD told her her muscles were too far gone to do any good, therefore she stopped. She states that she is a little overwhelmed with all of her exercises for bones/muscles. She does also go to the pool. She is active and motivated. She is having consistent yellow/brown discharge since wearing pessary - she has talked with MD about it. Her pessary was placed and will remain in place until her 3 month appointment.  Fluid intake: Yes: not discussed in detail     PAIN:  Are you having pain? Yes NPRS scale: no provided this session Pain location:  Lt hip pain   Pain type: aching Pain description: intermittent    Aggravating factors: walking Relieving factors: rest   PRECAUTIONS: None   WEIGHT BEARING RESTRICTIONS: No   FALLS:  Has patient fallen in last 6  months? 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/25/22, spinal stenosis L4-5, osteopenia/osteoporosis Sexual abuse: No   BOWEL MOVEMENT: Pain with bowel movement: No Type of bowel movement:Frequency sometimes multiple times a day, sometimes just once and Strain No Fully empty rectum: Yes: fees empty, but then she still has to go Leakage: No - not in the last several weeks besides occasional leakage - at time of referral she was having bowel movement leakage outside of control every time she urinated  Pads: Yes: - Fiber supplement: No *uses wash cloth to clean   URINATION: Pain with urination: No Fully empty bladder: Yes: she states that she tries to Stream: Strong Urgency: Yes: does not hold urine well Frequency: 2x/night; every hour - unchanged since she got pessary Leakage: Urge to void and Walking to the bathroom Pads: Yes: goes through several a day, mostly for discharge    INTERCOURSE: Pain with intercourse:  not sexually active currently; history of pain since menopause     PREGNANCY: Vaginal deliveries 2 Tearing Yes: forceps delivery ; significant tearing     PROLAPSE: Cystocele had to press bladder back inside vaginal canal prior to pessary     OBJECTIVE:  04/23/23 Internal vaginal exam:  Strength 3/5 Endurance 10 seconds, holding longer she decreases contraction with inhale and then re-squeezes with exhale, cannot hold throughout inhale  Internal rectal exam: Lichens plaque noted Rt peri-anal area EAS 3/5 Pelvic floor 3/5 Bulge: initially paradoxical contraction; with cuing corrected and performed very well  02/11/23: Palpation: severe tenderness, muscle spasm/trigger points, scar tissue restriction over Lt hip  Balance: unable to  stand on Lt LE Hip strength:  Rt: grossly 5/5 Lt: flexion 4/5, adduction 3/5, abduction 2/5 and pain, IR 3/5 and pain, ER 3/5 and pain  Pelvic floor strength: (Rectal exam) Pelvic floor strength 3/5 Pelvic floor endurance 22 seconds Active lichens  sclerosis plaques present around anus with skin irritation  01/07/23: Grade 3 anterior vaginal wall laxity without pessary Pelvic floor strength 2/5, external anal sphincter 2/5 Difficulty with appropriate bearing down/bulge - able to perform balloon breathing with cuing  11/19/22:  COGNITION: Overall cognitive status: Within functional limits for tasks assessed                          SENSATION: Light touch: Appears intact Proprioception: Appears intact   MUSCLE LENGTH:     FUNCTIONAL TESTS:      GAIT: Lt trendelenburg   POSTURE: rounded shoulders, forward head, decreased lumbar lordosis, increased thoracic kyphosis, and posterior pelvic tilt   LUMBARAROM/PROM:     LOWER EXTREMITY MMT:     PALPATION:   General  mild abdominal doming with increased abdominal pressure                 External Perineal Exam: palor in tissues, fusion of labia minora with labia majora, bright red urethral tissue, red irritated issue in vestibule                             Internal Pelvic Floor no tenderness, pessary present   Patient confirms identification and approves PT to assess internal pelvic floor and treatment Yes   PELVIC MMT:   MMT eval  Vaginal 2/5, endurance 3 seconds, repeat contractions 4x  Internal Anal Sphincter    External Anal Sphincter    Puborectalis    Diastasis Recti WNL  (Blank rows = not tested)         TONE: low   PROLAPSE: Pessary present, but anterior vaginal wall laxity still noted with bear down, no rectocele appreciated with pessary   TODAY'S TREATMENT 05/14/23 Manual: Trigger Point Dry-Needling  Treatment instructions: Expect mild to moderate muscle soreness. S/S of pneumothorax if dry needled  over a lung field, and to seek immediate medical attention should they occur. Patient verbalized understanding of these instructions and education.  Patient Consent Given: Yes Education handout provided: Yes Muscles treated: Lt TFL, vastus lateralis, adductors, and glutes Electrical stimulation performed: No Parameters: N/A Treatment response/outcome: twitch response and improved tension Instrument assisted soft tissue mobilization Lt hip Soft tissue mobilization to low back/Lt hip Therapeutic activities: Review pelvic floor strengthening program - working on long holds Urge drill for nocturia   TREATMENT 04/23/23 REVAL Manual: Pt provides verbal consent for internal vaginal/rectal pelvic floor exam. Internal vaginal and rectal pelvic floor muscle re-assessment Neuromuscular re-education: Pelvic floor contraction training vaginally/rectally Quick flicks Long holds - working on 30 second effort holds   TREATMENT 04/16/23 Manual: Trigger Point Dry-Needling  Treatment instructions: Expect mild to moderate muscle soreness. S/S of pneumothorax if dry needled over a lung field, and to seek immediate medical attention should they occur. Patient verbalized understanding of these instructions and education.  Patient Consent Given: Yes Education handout provided: Yes Muscles treated: Lt TFL, vastus lateralis, adductors, and glutes Electrical stimulation performed: No Parameters: N/A Treatment response/outcome: twitch response and improved tension Instrument assisted soft tissue mobilization Lt hip Neuromuscular re-education: Lower trunk rotation in swiss ball table top Cat cow with multimodal cues for better core facilitation during lift Quadruped UE ball press for core facilitation   PATIENT EDUCATION:  Education details: see above Person educated: Patient Education method: Explanation, Demonstration, Actor cues, Verbal cues, and Handouts Education comprehension: verbalized  understanding   HOME EXERCISE PROGRAM: Access  Code: [email or web address]     ASSESSMENT:   CLINICAL IMPRESSION: Pt overall doing well with Lt hip pain and functional ability; she is not seeing any changes in pelvic floor dysfunction. Believe we will not see much improvement in fecal incontinence and urinary urgency until she is able to work on pelvic floor strengthening more frequently and get diarrhea under control. She has trouble performing pelvic floor strengthening due to bloating, but she was encouraged to perform whenever possible. She will continue to benefit from skilled PT intervention in order to decrease Lt hip pain, improve urinary/bowel symptoms, perform pelvic floor strengthening program, and incorporate pelvic floor into functional strengthening with appropriate pressure management.    OBJECTIVE IMPAIRMENTS: Abnormal gait, decreased activity tolerance, decreased coordination, decreased endurance, decreased strength, increased fascial restrictions, increased muscle spasms, impaired tone, postural dysfunction, and pain.    ACTIVITY LIMITATIONS: continence   PARTICIPATION LIMITATIONS: community activity   PERSONAL FACTORS: 3+ comorbidities: Lichens sclerosis, cystocele, hemorrhoids, diarrhea, Lt THA 04/25/22, spinal stenosis L4-5, osteopenia/osteoporosis  are also affecting patient's functional outcome.    REHAB POTENTIAL: Good   CLINICAL DECISION MAKING: Stable/uncomplicated   EVALUATION COMPLEXITY: Low     GOALS: Goals reviewed with patient? Yes   SHORT TERM GOALS: Target date: 12/17/22 - updated 12/25/22 - updated 02/05/23 new goal 04/22/2023  - updated 04/23/23 with new goal 07/02/23   Pt will be independent with HEP.    Baseline: Goal status: MET 02/11/23   2.  Pt will be able to correctly perform diaphragmatic breathing and appropriate pressure management in order to prevent worsening vaginal wall laxity and improve pelvic floor A/ROM.    Baseline:  Goal status: Met 02/11/23   3.   Pt will be independent with the knack, urge suppression technique, and double voiding in order to improve bladder habits and decrease urinary/fecal incontinence.    Baseline:  Goal status: MET 04/23/23     LONG TERM GOALS: Target date: 01/28/22 - updated 12/25/22 - updated 02/05/23 new goal 04/22/2023 - updated 04/23/23 with new goal 07/02/23   Pt will be independent with advanced HEP.    Baseline:  Goal status: IN PROGRESS   2.  Pt will demonstrate normal pelvic floor muscle tone and A/ROM, able to achieve 3/5 strength with contractions and 10 sec endurance, in order to provide appropriate lumbopelvic support in functional activities.    Baseline:  Goal status: MET 02/11/23   3.  Pt will report improved sensation of control over urgency and continence with bowel movements without any episodes of fecal incontinence.  Baseline:  Goal status: IIN PROGRESS   4.  Pt will be able to go 2-3 hours in between voids without urgency or incontinence in order to improve QOL and perform all functional activities with less difficulty.    Baseline:  Goal status: IN PROGRESS  5. Pt will demonstrate normal pelvic floor muscle tone and A/ROM, able to achieve 4/5 strength with contractions and 30 sec endurance, in order to provide appropriate lumbopelvic support in functional activities.    Baseline:  Goal status: IN PROGRESS  6. Pt will report no higher Lt hip pain during functional activities than 2/10 and be able to stand/walk for greater than 30 minutes without difficulty.    Baseline:  Goal status: IN PROGRESS     PLAN:   PT FREQUENCY: 1x/week   PT DURATION: 10 weeks   PLANNED INTERVENTIONS: Therapeutic exercises, Therapeutic activity, Neuromuscular re-education, Balance training, Gait training, Patient/Family education, Self Care, Joint mobilization, Dry Needling,  Biofeedback, and Manual therapy   PLAN FOR NEXT SESSION: Continue to review pelvic floor strengthening; address LT hip pain as  needed.      Julio Alm, PT, DPT05/22/242:47 PM

## 2023-05-27 ENCOUNTER — Ambulatory Visit: Payer: Medicare Other | Admitting: Obstetrics and Gynecology

## 2023-05-27 ENCOUNTER — Encounter: Payer: Self-pay | Admitting: Obstetrics and Gynecology

## 2023-05-27 VITALS — BP 104/64 | HR 76

## 2023-05-27 DIAGNOSIS — N812 Incomplete uterovaginal prolapse: Secondary | ICD-10-CM | POA: Diagnosis not present

## 2023-05-27 DIAGNOSIS — Z4689 Encounter for fitting and adjustment of other specified devices: Secondary | ICD-10-CM

## 2023-05-27 DIAGNOSIS — N8110 Cystocele, unspecified: Secondary | ICD-10-CM

## 2023-05-27 MED ORDER — ESTRADIOL 7.5 MCG/24HR VA RING: 1.0000 | VAGINAL_RING | VAGINAL | 3 refills | Status: DC

## 2023-05-27 NOTE — Progress Notes (Signed)
[facility]   Subjective:     Chief Complaint:  Chief Complaint  Patient presents with   Pessary fitting   History of Present Illness: Mackenzie Green is a 77 y.o. female with stage III pelvic organ prolapse who presents for a pessary check. She is using a size 2 1/4in short stem gellhorn pessary. The pessary has been working well and she has no complaints. She is using vaginal estrogen. She endorses some minimal vaginal bleeding.   She has been taking Uqora for bladder irritation symptoms and reports this is working well for her.   Past Medical History: Patient  has a past medical history of Low iron, Mild cognitive impairment with memory loss, and Mitral valve prolapse.   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   Medications: She has a current medication list which includes the following prescription(s): biotin, calcium, cephalexin, cholecalciferol, cholestyramine, clobetasol prop emollient base, estradiol, ferrous sulfate, iron, fluconazole, fluzone high-dose quadrivalent, folic acid, magnesium citrate, vitamin k2, multivitamin adult, ascorbic acid, and vitamin k.   Allergies: Patient is allergic to dairy aid [tilactase].   Social History: Patient  reports that she has never smoked. She has never used smokeless tobacco. She reports current alcohol use. She reports that she does not use drugs.      Objective:    Physical Exam: BP 104/64   Pulse 76  Gen: No apparent distress, A&O x 3. Detailed Urogynecologic Evaluation:  Pelvic Exam: Normal external female genitalia; Bartholin's and Skene's glands normal in appearance; urethral meatus normal in appearance, no urethral masses or discharge. The pessary was noted to be in place. It was removed and cleaned. Speculum exam revealed erythema in the vagina. The pessary was changed to a size 1 cube pessary (Lot [id number]) at her  request. Her goal is to be able to remove herself which could be achievable with the #1 cube.    Assessment/Plan:    Assessment: Mackenzie Green is a 77 y.o. with stage III pelvic organ prolapse here for a pessary check. She is doing well.  Plan: She will keep the pessary in place until next visit. She will continue to use estrogen.  Patient reports she does not wish to pursue surgery at this time and would like to use the pessary as long as possible. We discussed that some of her bleeding issues may be due to her caruncle but there has been vast improvement in the tissues since she first started coming. She does still have bleeding behind the pessary and would benefit from an e-string as she cannot reach behind the pessary to place estrogen cream. Would not benefit from oral estrogen or patches as she needs it topically in the vagina and not systemically. Unable to put vagifem or intrarosa behind a pessary. Will prescribe Estring to assist in this.   She will follow-up in 3 months for a pessary check or sooner as needed. Will plan to attempt teaching to take in and out at next visit.

## 2023-05-27 NOTE — Patient Instructions (Addendum)
Use the estrogen cream on the caruncle.   I will work on the estrogen ring with insurance

## 2023-06-03 ENCOUNTER — Ambulatory Visit (INDEPENDENT_AMBULATORY_CARE_PROVIDER_SITE_OTHER): Payer: Medicare Other

## 2023-06-03 DIAGNOSIS — R600 Localized edema: Secondary | ICD-10-CM

## 2023-06-03 LAB — ECHOCARDIOGRAM COMPLETE
Area-P 1/2: 3.31 cm2
MV M vel: 4.73 m/s
MV Peak grad: 89.5 mmHg
Radius: 0.6 cm
S' Lateral: 2.31 cm
Single Plane A2C EF: 72 %

## 2023-06-04 ENCOUNTER — Encounter: Payer: Medicare Other | Admitting: Obstetrics and Gynecology

## 2023-06-10 ENCOUNTER — Ambulatory Visit: Payer: Medicare Other | Attending: Family Medicine

## 2023-06-10 DIAGNOSIS — R279 Unspecified lack of coordination: Secondary | ICD-10-CM | POA: Diagnosis not present

## 2023-06-10 DIAGNOSIS — M62838 Other muscle spasm: Secondary | ICD-10-CM | POA: Diagnosis not present

## 2023-06-10 DIAGNOSIS — R293 Abnormal posture: Secondary | ICD-10-CM | POA: Diagnosis not present

## 2023-06-10 DIAGNOSIS — M6281 Muscle weakness (generalized): Secondary | ICD-10-CM | POA: Diagnosis not present

## 2023-06-10 DIAGNOSIS — M25552 Pain in left hip: Secondary | ICD-10-CM | POA: Diagnosis not present

## 2023-06-10 NOTE — Therapy (Signed)
OUTPATIENT PHYSICAL THERAPY TREATMENT NOTE   Patient Name: Mackenzie Green MRN: [id number] DOB:May 07, 1946, 77 y.o., female Today's Date: 06/10/2023  PCP: Clayborn Heron, MD REFERRING PROVIDER: Moshe Cipro, NP  END OF SESSION:   PT End of Session - 06/10/23 1617     Visit Number 19    Date for PT Re-Evaluation 07/02/23    Authorization Type [employer]    PT Start Time 1615    PT Stop Time 1655    PT Time Calculation (min) 40 min    Activity Tolerance Patient tolerated treatment well    Behavior During Therapy WFL for tasks assessed/performed                     Past Medical History:  Diagnosis Date   Low iron    Mild cognitive impairment with memory loss    Mitral valve prolapse    Past Surgical History:  Procedure Laterality Date   TEAR DUCT PROBING     TOTAL HIP ARTHROPLASTY Left 03/27/2022   Procedure: LEFT POSTERIOR TOTAL HIP ARTHROPLASTY;  Surgeon: Joen Laura, MD;  Location: [facility] OR;  Service: Orthopedics;  Laterality: Left;   Patient Active Problem List   Diagnosis Date Noted   Hip fracture (HCC) 03/26/2022   Mild cognitive impairment with memory loss 03/26/2022   Depression with anxiety 03/26/2022   Spinal stenosis at L4-L5 level 03/26/2022   Neutropenia (HCC) 08/27/2016   Weight loss 07/31/2012   Hypokalemia 07/31/2012   Hyponatremia 07/30/2012   Hypoglycemia 07/30/2012    REFERRING DIAG: N81.10 (ICD-10-CM) - Cystocele, unspecified R15.9 (ICD-10-CM) - Full incontinence of feces  THERAPY DIAG:  Abnormal posture  Muscle weakness (generalized)  Unspecified lack of coordination  Other muscle spasm  Pain in left hip  Rationale for Evaluation and Treatment Rehabilitation  PERTINENT HISTORY: Lichens sclerosis, cystocele, hemorrhoids, diarrhea, 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 She has not had any UTIs recently. She has decided that she does not want to do surgery as of right now. Pt states that she is having increasing stiffness in Lt hip. She had 3 really good weeks.    PAIN:  Are you having pain? Yes: NPRS scale: 6/10 Pain location: Lt hip Pain description: stiff Aggravating factors:  walking - unrelenting pain, standing Relieving factors: pain medication to some extent   11/19/22 SUBJECTIVE STATEMENT: Pt states that she has had issues with diarrhea and fecal incontinence; this is better after getting a pessary about a month and a half ago. She was having vaginal bleeding and was told this was from bladder prolapse; she has not had this since pessary was placed. She also had series of UTIs and a yeast infection. She is working on trying to build bone density right now due to fall and Lt hip replacement. She has been doing pelvic floor contractions 4x/day in various positions until MD told her her muscles were too far gone to do any good, therefore she stopped. She states that she is a little overwhelmed with all of her exercises for bones/muscles. She does also go to the pool. She is active and motivated. She is having consistent yellow/brown discharge since wearing pessary - she has talked with MD about it. Her pessary was placed and will remain in place until her 3 month appointment.  Fluid intake: Yes: not discussed in detail     PAIN:  Are you having pain? Yes NPRS scale: no provided this session Pain location:  Lt hip pain   Pain type: aching Pain description: intermittent    Aggravating factors: walking Relieving factors: rest   PRECAUTIONS: None   WEIGHT BEARING RESTRICTIONS: No   FALLS:  Has patient fallen in last 6  months? 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/25/22, spinal stenosis L4-5, osteopenia/osteoporosis Sexual abuse: No   BOWEL MOVEMENT: Pain with bowel movement: No Type of bowel movement:Frequency sometimes multiple times a day, sometimes just once and Strain No Fully empty rectum: Yes: fees empty, but then she still has to go Leakage: No - not in the last several weeks besides occasional leakage - at time of referral she was having bowel movement leakage outside of control every time she urinated  Pads: Yes: - Fiber supplement: No *uses wash cloth to clean   URINATION: Pain with urination: No Fully empty bladder: Yes: she states that she tries to Stream: Strong Urgency: Yes: does not hold urine well Frequency: 2x/night; every hour - unchanged since she got pessary Leakage: Urge to void and Walking to the bathroom Pads: Yes: goes through several a day, mostly for discharge    INTERCOURSE: Pain with intercourse:  not sexually active currently; history of pain since menopause     PREGNANCY: Vaginal deliveries 2 Tearing Yes: forceps delivery ; significant tearing     PROLAPSE: Cystocele had to press bladder back inside vaginal canal prior to pessary     OBJECTIVE:  04/23/23 Internal vaginal exam:  Strength 3/5 Endurance 10 seconds, holding longer she decreases contraction with inhale and then re-squeezes with exhale, cannot hold throughout inhale  Internal rectal exam: Lichens plaque noted Rt peri-anal area EAS 3/5 Pelvic floor 3/5 Bulge: initially paradoxical contraction; with cuing corrected and performed very well  02/11/23: Palpation: severe tenderness, muscle spasm/trigger points, scar tissue restriction over Lt hip  Balance: unable to  stand on Lt LE Hip strength:  Rt: grossly 5/5 Lt: flexion 4/5, adduction 3/5, abduction 2/5 and pain, IR 3/5 and pain, ER 3/5 and pain  Pelvic floor strength: (Rectal exam) Pelvic floor strength 3/5 Pelvic  floor endurance 22 seconds Active lichens sclerosis plaques present around anus with skin irritation  01/07/23: Grade 3 anterior vaginal wall laxity without pessary Pelvic floor strength 2/5, external anal sphincter 2/5 Difficulty with appropriate bearing down/bulge - able to perform balloon breathing with cuing  11/19/22:  COGNITION: Overall cognitive status: Within functional limits for tasks assessed                          SENSATION: Light touch: Appears intact Proprioception: Appears intact   MUSCLE LENGTH:     FUNCTIONAL TESTS:      GAIT: Lt trendelenburg   POSTURE: rounded shoulders, forward head, decreased lumbar lordosis, increased thoracic kyphosis, and posterior pelvic tilt   LUMBARAROM/PROM:     LOWER EXTREMITY MMT:     PALPATION:   General  mild abdominal doming with increased abdominal pressure                 External Perineal Exam: palor in tissues, fusion of labia minora with labia majora, bright red urethral tissue, red irritated issue in vestibule                             Internal Pelvic Floor no tenderness, pessary present   Patient confirms identification and approves PT to assess internal pelvic floor and treatment Yes   PELVIC MMT:   MMT eval  Vaginal 2/5, endurance 3 seconds, repeat contractions 4x  Internal Anal Sphincter    External Anal Sphincter    Puborectalis    Diastasis Recti WNL  (Blank rows = not tested)         TONE: low   PROLAPSE: Pessary present, but anterior vaginal wall laxity still noted with bear down, no rectocele appreciated with pessary   TODAY'S TREATMENT 06/10/23 Manual: Trigger Point Dry-Needling  Treatment instructions: Expect mild to moderate muscle soreness. S/S of pneumothorax if dry needled  over a lung field, and to seek immediate medical attention should they occur. Patient verbalized understanding of these instructions and education.  Patient Consent Given: Yes Education handout provided: Yes Muscles treated: Lt TFL, vastus lateralis, adductors, and glutes Electrical stimulation performed: No Parameters: N/A Treatment response/outcome: twitch response and improved tension Instrument assisted soft tissue mobilization Lt hip Soft tissue mobilization to low back/Lt hip Therapeutic activities: Impressing importance of pelvic floor long holds in addition to performing pelvic floor contractions in other exercises  05/14/23 Manual: Trigger Point Dry-Needling  Treatment instructions: Expect mild to moderate muscle soreness. S/S of pneumothorax if dry needled over a lung field, and to seek immediate medical attention should they occur. Patient verbalized understanding of these instructions and education.  Patient Consent Given: Yes Education handout provided: Yes Muscles treated: Lt TFL, vastus lateralis, adductors, and glutes Electrical stimulation performed: No Parameters: N/A Treatment response/outcome: twitch response and improved tension Instrument assisted soft tissue mobilization Lt hip Soft tissue mobilization to low back/Lt hip Therapeutic activities: Review pelvic floor strengthening program - working on long holds Urge drill for nocturia   TREATMENT 04/23/23 REVAL Manual: Pt provides verbal consent for internal vaginal/rectal pelvic floor exam. Internal vaginal and rectal pelvic floor muscle re-assessment Neuromuscular re-education: Pelvic floor contraction training vaginally/rectally Quick flicks Long holds - working on 30 second effort holds   PATIENT EDUCATION:  Education details: see above Person educated: Patient Education method: Programmer, multimedia, Facilities manager, Actor cues, Verbal cues, and Handouts Education comprehension: verbalized understanding    HOME EXERCISE  PROGRAM: Access Code: [email or web address]     ASSESSMENT:   CLINICAL IMPRESSION: Pt is doing better with new pessary and is not having vaginal bleeding since it was placed. Lt hip did well for about 3 weeks, and then started getting sore. Good tolerance to DN today in Lt glutes, TFL, piriformis; significant twitch response and release of trigger points. Believe there continues to be relationship between fecal incontinence, prolapse, and Lt hip replacement. However she is seeing fecal urgency/incontinence worsen with unknown cause. Her urinary urgency is also no better controlled; we reviewed urge drill but she does not feel like performing consistently is helpful. She will continue to benefit from skilled PT intervention in order to decrease Lt hip pain, improve urinary/bowel symptoms, perform pelvic floor strengthening program, and incorporate pelvic floor into functional strengthening with appropriate pressure management.    OBJECTIVE IMPAIRMENTS: Abnormal gait, decreased activity tolerance, decreased coordination, decreased endurance, decreased strength, increased fascial restrictions, increased muscle spasms, impaired tone, postural dysfunction, and pain.    ACTIVITY LIMITATIONS: continence   PARTICIPATION LIMITATIONS: community activity   PERSONAL FACTORS: 3+ comorbidities: Lichens sclerosis, cystocele, hemorrhoids, diarrhea, Lt THA 04/25/22, spinal stenosis L4-5, osteopenia/osteoporosis  are also affecting patient's functional outcome.    REHAB POTENTIAL: Good   CLINICAL DECISION MAKING: Stable/uncomplicated   EVALUATION COMPLEXITY: Low     GOALS: Goals reviewed with patient? Yes   SHORT TERM GOALS: Target date: 12/17/22 - updated 12/25/22 - updated 02/05/23 new goal 04/22/2023  - updated 04/23/23 with new goal 07/02/23 - updated 06/10/23   Pt will be independent with HEP.    Baseline: Goal status: MET 02/11/23   2.  Pt will be able to correctly perform diaphragmatic breathing and  appropriate pressure management in order to prevent worsening vaginal wall laxity and improve pelvic floor A/ROM.    Baseline:  Goal status: Met 02/11/23   3.  Pt will be independent with the knack, urge suppression technique, and double voiding in order to improve bladder habits and decrease urinary/fecal incontinence.    Baseline:  Goal status: MET 04/23/23     LONG TERM GOALS: Target date: 01/28/22 - updated 12/25/22 - updated 02/05/23 new goal 04/22/2023 - updated 04/23/23 with new goal 07/02/23 - updated 06/10/23   Pt will be independent with advanced HEP.    Baseline:  Goal status: IN PROGRESS   2.  Pt will demonstrate normal pelvic floor muscle tone and A/ROM, able to achieve 3/5 strength with contractions and 10 sec endurance, in order to provide appropriate lumbopelvic support in functional activities.    Baseline:  Goal status: MET 02/11/23   3.  Pt will report improved sensation of control over urgency and continence with bowel movements without any episodes of fecal incontinence.  Baseline:  Goal status: IIN PROGRESS   4.  Pt will be able to go 2-3 hours in between voids without urgency or incontinence in order to improve QOL and perform all functional activities with less difficulty.    Baseline:  Goal status: IN PROGRESS  5. Pt will demonstrate normal pelvic floor muscle tone and A/ROM, able to achieve 4/5 strength with contractions and 30 sec endurance, in order to provide appropriate lumbopelvic support in functional activities.    Baseline:  Goal status: IN PROGRESS  6. Pt will report no higher Lt hip pain during functional activities than 2/10 and be able to stand/walk for greater than 30 minutes without difficulty.    Baseline:  Goal status: IN PROGRESS     PLAN:  PT FREQUENCY: 1x/week   PT DURATION: 10 weeks   PLANNED INTERVENTIONS: Therapeutic exercises, Therapeutic activity, Neuromuscular re-education, Balance training, Gait training, Patient/Family  education, Self Care, Joint mobilization, Dry Needling, Biofeedback, and Manual therapy   PLAN FOR NEXT SESSION: Continue to review pelvic floor strengthening; address LT hip pain as needed. Re-evaluation/10th visit progress note.      Julio Alm, PT, DPT06/18/244:59 PM

## 2023-06-23 DIAGNOSIS — M1612 Unilateral primary osteoarthritis, left hip: Secondary | ICD-10-CM | POA: Diagnosis not present

## 2023-06-23 DIAGNOSIS — N8110 Cystocele, unspecified: Secondary | ICD-10-CM | POA: Diagnosis not present

## 2023-06-23 DIAGNOSIS — M25471 Effusion, right ankle: Secondary | ICD-10-CM | POA: Diagnosis not present

## 2023-07-09 ENCOUNTER — Ambulatory Visit: Payer: Medicare Other | Attending: Family Medicine

## 2023-07-09 DIAGNOSIS — R293 Abnormal posture: Secondary | ICD-10-CM | POA: Diagnosis not present

## 2023-07-09 DIAGNOSIS — R279 Unspecified lack of coordination: Secondary | ICD-10-CM | POA: Diagnosis not present

## 2023-07-09 DIAGNOSIS — M62838 Other muscle spasm: Secondary | ICD-10-CM | POA: Diagnosis not present

## 2023-07-09 DIAGNOSIS — M25552 Pain in left hip: Secondary | ICD-10-CM | POA: Insufficient documentation

## 2023-07-09 DIAGNOSIS — M6281 Muscle weakness (generalized): Secondary | ICD-10-CM | POA: Insufficient documentation

## 2023-07-09 NOTE — Therapy (Signed)
OUTPATIENT PHYSICAL THERAPY TREATMENT NOTE Progress Note Reporting Period 02/25/23 to 07/09/23  See note below for Objective Data and Assessment of Progress/Goals.      Patient Name: Mackenzie Green MRN: [id number] DOB:[birth year], 77 y.o., female Today's Date: 07/09/2023  PCP: Clayborn Heron, MD REFERRING PROVIDER: Moshe Cipro, NP  END OF SESSION:   PT End of Session - 07/09/23 1445     Visit Number 20    Date for PT Re-Evaluation 09/03/23    Authorization Type [employer]    PT Start Time 1445    PT Stop Time 1530    PT Time Calculation (min) 45 min    Activity Tolerance Patient tolerated treatment well    Behavior During Therapy WFL for tasks assessed/performed                      Past Medical History:  Diagnosis Date   Low iron    Mild cognitive impairment with memory loss    Mitral valve prolapse    Past Surgical History:  Procedure Laterality Date   TEAR DUCT PROBING     TOTAL HIP ARTHROPLASTY Left 03/27/2022   Procedure: LEFT POSTERIOR TOTAL HIP ARTHROPLASTY;  Surgeon: Joen Laura, MD;  Location: [facility] OR;  Service: Orthopedics;  Laterality: Left;   Patient Active Problem List   Diagnosis Date Noted   Hip fracture (HCC) 03/26/2022   Mild cognitive impairment with memory loss 03/26/2022   Depression with anxiety 03/26/2022   Spinal stenosis at L4-L5 level 03/26/2022   Neutropenia (HCC) 08/27/2016   Weight loss 07/31/2012   Hypokalemia 07/31/2012   Hyponatremia 07/30/2012   Hypoglycemia 07/30/2012    REFERRING DIAG: N81.10 (ICD-10-CM) - Cystocele, unspecified R15.9 (ICD-10-CM) - Full incontinence of feces  THERAPY DIAG:  Abnormal posture  Muscle weakness (generalized)  Unspecified lack of coordination  Other muscle spasm  Pain in left hip  Rationale for Evaluation and Treatment Rehabilitation  PERTINENT HISTORY: Lichens sclerosis, cystocele, hemorrhoids, diarrhea, 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 She saw another pelvic floor PT who recommended getting testosterone added to estrogen cream.   Pt states that she still is having Lt hip pain and very tender/hard spot. She is not able to lie on this side without discomfort.  Pt reports  no changes in fecal incontinence. Urinary urgency continues to improve.    PAIN:  Are you having pain? Yes: NPRS scale: 5/10 Pain location: Lt hip Pain description: stiff Aggravating factors: walking - unrelenting pain, standing Relieving factors: pain medication to some extent   11/19/22 SUBJECTIVE STATEMENT: Pt states that she has had issues with diarrhea and fecal incontinence; this is better after getting a pessary about a month and a half ago. She was having vaginal bleeding and was told this was from bladder prolapse; she has not had this since pessary was placed. She also had series of UTIs and a yeast infection. She is working on trying to build bone density right now due to fall and Lt hip replacement. She has been doing pelvic floor contractions 4x/day in various positions until MD told her her muscles were too far gone to do any good, therefore she stopped. She states that she is a little overwhelmed with all of her exercises for bones/muscles. She does also go to the pool. She is active and motivated. She is having consistent yellow/brown discharge since wearing pessary - she has talked with MD about it. Her pessary was placed and will remain in place until her 3 month appointment.  Fluid intake: Yes: not discussed in detail     PAIN:  Are you having pain? Yes NPRS scale: no provided this  session Pain location:  Lt hip pain   Pain type: aching Pain description: intermittent    Aggravating factors: walking Relieving factors: rest   PRECAUTIONS: None   WEIGHT BEARING RESTRICTIONS: No   FALLS:  Has patient fallen in last 6 months? 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/25/22, spinal stenosis L4-5, osteopenia/osteoporosis Sexual abuse: No   BOWEL MOVEMENT: Pain with bowel movement: No Type of bowel movement:Frequency sometimes multiple times a day, sometimes just once and Strain No Fully empty rectum: Yes: fees empty, but then she still has to go Leakage: No - not in the last several weeks besides occasional leakage - at time of referral she was having bowel movement leakage outside of control every time she urinated  Pads: Yes: - Fiber supplement: No *uses wash cloth to clean   URINATION: Pain with urination: No Fully empty bladder: Yes: she states that she tries to Stream: Strong Urgency: Yes: does not hold urine well Frequency: 2x/night; every hour - unchanged since she got pessary Leakage: Urge to void and Walking to the bathroom Pads: Yes: goes through several a day, mostly for discharge    INTERCOURSE: Pain with intercourse:  not sexually active currently; history of pain since menopause     PREGNANCY: Vaginal deliveries 2 Tearing Yes: forceps delivery ; significant tearing     PROLAPSE: Cystocele had to press bladder back inside vaginal canal prior to pessary     OBJECTIVE:  07/09/23: Gait: Lt compensated trendelenburg Low back: flexion 75%, extension 50%, bil lateral flexion 75% Squat: Rt weight shift Single leg stance: >15 seconds bil, trunk placement over Lt LE with LT single leg stance for improved balance  5x  STS: 18 seconds Palpation: tenderness and restriction throughout Lt hip  04/23/23 Internal vaginal exam:  Strength 3/5 Endurance 10 seconds, holding longer she decreases contraction with inhale and then re-squeezes with exhale, cannot hold throughout  inhale  Internal rectal exam: Lichens plaque noted Rt peri-anal area EAS 3/5 Pelvic floor 3/5 Bulge: initially paradoxical contraction; with cuing corrected and performed very well  02/11/23: Palpation: severe tenderness, muscle spasm/trigger points, scar tissue restriction over Lt hip  Balance: unable to stand on Lt LE Hip strength:  Rt: grossly 5/5 Lt: flexion 4/5, adduction 3/5, abduction 2/5 and pain, IR 3/5 and pain, ER 3/5 and pain  Pelvic floor strength: (Rectal exam) Pelvic floor strength 3/5 Pelvic floor endurance 22 seconds Active lichens sclerosis plaques present around anus with skin irritation  01/07/23: Grade 3 anterior vaginal wall laxity without pessary Pelvic floor strength 2/5, external anal sphincter 2/5 Difficulty with appropriate bearing down/bulge - able to perform balloon breathing with cuing  11/19/22:  COGNITION: Overall cognitive status: Within functional limits for tasks assessed                          SENSATION: Light touch: Appears intact Proprioception: Appears intact   MUSCLE LENGTH:     FUNCTIONAL TESTS:      GAIT: Lt trendelenburg   POSTURE: rounded shoulders, forward head, decreased lumbar lordosis, increased thoracic kyphosis, and posterior pelvic tilt   LUMBARAROM/PROM:     LOWER EXTREMITY MMT:     PALPATION:   General  mild abdominal doming with increased abdominal pressure                 External Perineal Exam: palor in tissues, fusion of labia minora with labia majora, bright red urethral tissue, red irritated issue in vestibule                             Internal Pelvic Floor no tenderness, pessary present   Patient confirms identification and approves PT to assess  internal pelvic floor and treatment Yes   PELVIC MMT:   MMT eval  Vaginal 2/5, endurance 3 seconds, repeat contractions 4x  Internal Anal Sphincter    External Anal Sphincter    Puborectalis    Diastasis Recti WNL  (Blank rows = not tested)         TONE: low   PROLAPSE: Pessary present, but anterior vaginal wall laxity still noted with bear down, no rectocele appreciated with pessary   TODAY'S TREATMENT 07/09/23 RE-EVALUATION Manual: Trigger Point Dry-Needling  Treatment instructions: Expect mild to moderate muscle soreness. S/S of pneumothorax if dry needled over a lung field, and to seek immediate medical attention should they occur. Patient verbalized understanding of these instructions and education.  Patient Consent Given: Yes Education handout provided: Yes Muscles treated: Lt TFL, vastus lateralis, adductors, and glutes Electrical stimulation performed: No Parameters: N/A Treatment response/outcome: twitch response and improved tension Instrument assisted soft tissue mobilization Lt hip Soft tissue mobilization to low back/Lt hip Therapeutic activities: Importance of diaphragmatic breathing and relationship to posture and pelvic floor/prolapse   06/10/23 Manual: Trigger Point Dry-Needling  Treatment instructions: Expect mild to moderate muscle soreness. S/S of pneumothorax if dry needled over a lung field, and to seek immediate medical attention should they occur. Patient verbalized understanding of these instructions and education.  Patient Consent Given: Yes Education handout provided: Yes Muscles treated: Lt TFL, vastus lateralis, adductors, and glutes Electrical stimulation performed: No Parameters: N/A Treatment response/outcome: twitch response and improved tension Instrument assisted soft tissue mobilization Lt hip Soft tissue mobilization to low back/Lt hip Therapeutic activities: Impressing importance of pelvic floor long holds in  addition to  performing pelvic floor contractions in other exercises  05/14/23 Manual: Trigger Point Dry-Needling  Treatment instructions: Expect mild to moderate muscle soreness. S/S of pneumothorax if dry needled over a lung field, and to seek immediate medical attention should they occur. Patient verbalized understanding of these instructions and education.  Patient Consent Given: Yes Education handout provided: Yes Muscles treated: Lt TFL, vastus lateralis, adductors, and glutes Electrical stimulation performed: No Parameters: N/A Treatment response/outcome: twitch response and improved tension Instrument assisted soft tissue mobilization Lt hip Soft tissue mobilization to low back/Lt hip Therapeutic activities: Review pelvic floor strengthening program - working on long holds Urge drill for nocturia  PATIENT EDUCATION:  Education details: see above Person educated: Patient Education method: Programmer, multimedia, Facilities manager, Actor cues, Verbal cues, and Handouts Education comprehension: verbalized understanding   HOME EXERCISE PROGRAM: Access Code: [email or web address]     ASSESSMENT:   CLINICAL IMPRESSION: Pt encouraged to ask gynecologist is they would prescribe estrogen/testosterone cream to help vaginal tissues. She has not seen any change in fecal incontinence, but has not been performing pelvic floor contraction on regular basis. She continues to demonstrate functional Lt hip weakness with weight shifting to Rt with squat and increased 5x STS time; she has significant tenderness to palpation in Lt hip as well. Good tolerance to DN and manual techniques again this session with decrease in restriction and pain at end of session. We discussed role of good breathing techniques in pelvic floor function and posture, which also impacts abdominal pressure management/prolapse. She will continue to benefit from skilled PT intervention in order to decrease Lt hip pain, improve urinary/bowel symptoms, perform pelvic  floor strengthening program, and incorporate pelvic floor into functional strengthening with appropriate pressure management.    OBJECTIVE IMPAIRMENTS: Abnormal gait, decreased activity tolerance, decreased coordination, decreased endurance, decreased strength, increased fascial restrictions, increased muscle spasms, impaired tone, postural dysfunction, and pain.    ACTIVITY LIMITATIONS: continence   PARTICIPATION LIMITATIONS: community activity   PERSONAL FACTORS: 3+ comorbidities: Lichens sclerosis, cystocele, hemorrhoids, diarrhea, Lt THA 04/25/22, spinal stenosis L4-5, osteopenia/osteoporosis  are also affecting patient's functional outcome.    REHAB POTENTIAL: Good   CLINICAL DECISION MAKING: Stable/uncomplicated   EVALUATION COMPLEXITY: Low     GOALS: Goals reviewed with patient? Yes   SHORT TERM GOALS: Target date: 12/17/22 - updated 12/25/22 - updated 02/05/23 new goal 04/22/2023  - updated 04/23/23 with new goal 07/02/23 - updated 06/10/23    Pt will be independent with HEP.    Baseline: Goal status: MET 02/11/23   2.  Pt will be able to correctly perform diaphragmatic breathing and appropriate pressure management in order to prevent worsening vaginal wall laxity and improve pelvic floor A/ROM.    Baseline:  Goal status: Met 02/11/23   3.  Pt will be independent with the knack, urge suppression technique, and double voiding in order to improve bladder habits and decrease urinary/fecal incontinence.    Baseline:  Goal status: MET 04/23/23     LONG TERM GOALS: Target date: 01/28/22 - updated 12/25/22 - updated 02/05/23 new goal 04/22/2023 - updated 04/23/23 with new goal 07/02/23 - updated 06/10/23 - updated 07/09/23   Pt will be independent with advanced HEP.    Baseline:  Goal status: IN PROGRESS   2.  Pt will demonstrate normal pelvic floor muscle tone and A/ROM, able to achieve 3/5 strength with contractions and 10 sec endurance, in order to provide appropriate lumbopelvic support in  functional activities.    Baseline:  Goal  status: MET 02/11/23   3.  Pt will report improved sensation of control over urgency and continence with bowel movements without any episodes of fecal incontinence.  Baseline:  Goal status: IN PROGRESS   4.  Pt will be able to go 2-3 hours in between voids without urgency or incontinence in order to improve QOL and perform all functional activities with less difficulty.    Baseline:  Goal status: IN PROGRESS  5. Pt will demonstrate normal pelvic floor muscle tone and A/ROM, able to achieve 4/5 strength with contractions and 30 sec endurance, in order to provide appropriate lumbopelvic support in functional activities.    Baseline:  Goal status: IN PROGRESS  6. Pt will report no higher Lt hip pain during functional activities than 2/10 and be able to stand/walk for greater than 30 minutes without difficulty.    Baseline:  Goal status: IN PROGRESS  7. Pt will be able to perform 5x STS in under 14 seconds to demonstrate improved functional ability.    Baseline:  Goal status: INITIAL     PLAN:   PT FREQUENCY: 1x/week   PT DURATION: 8 weeks   PLANNED INTERVENTIONS: Therapeutic exercises, Therapeutic activity, Neuromuscular re-education, Balance training, Gait training, Patient/Family education, Self Care, Joint mobilization, Dry Needling, Biofeedback, and Manual therapy   PLAN FOR NEXT SESSION: Use of RUSI to help with diaphragmatic breathing; continue dry needling and manual techniques to help manage pain.      Julio Alm, PT, DPT07/17/242:47 PM

## 2023-07-28 DIAGNOSIS — M8588 Other specified disorders of bone density and structure, other site: Secondary | ICD-10-CM | POA: Diagnosis not present

## 2023-07-28 LAB — HM DEXA SCAN

## 2023-07-30 ENCOUNTER — Encounter: Payer: Self-pay | Admitting: Family

## 2023-08-01 ENCOUNTER — Ambulatory Visit: Payer: Medicare Other | Admitting: Obstetrics and Gynecology

## 2023-08-05 ENCOUNTER — Encounter: Payer: Self-pay | Admitting: Obstetrics and Gynecology

## 2023-08-05 ENCOUNTER — Ambulatory Visit: Payer: Medicare Other | Admitting: Obstetrics and Gynecology

## 2023-08-05 VITALS — BP 103/68 | HR 76

## 2023-08-05 DIAGNOSIS — N812 Incomplete uterovaginal prolapse: Secondary | ICD-10-CM | POA: Diagnosis not present

## 2023-08-05 DIAGNOSIS — N8110 Cystocele, unspecified: Secondary | ICD-10-CM

## 2023-08-05 DIAGNOSIS — Z4689 Encounter for fitting and adjustment of other specified devices: Secondary | ICD-10-CM

## 2023-08-05 NOTE — Patient Instructions (Addendum)
Remember your breathing for removal of the pessary. Take a deep breath and blow out like you are blowing through a straw to relax the pelvic floor. Take the pessary out once per week and let the tissues rest. Use a denture cup to allow it to dry overnight.

## 2023-08-05 NOTE — Progress Notes (Signed)
[facility]   Subjective:     Chief Complaint:  Chief Complaint  Patient presents with   Pessary Check    Mackenzie Green is a 77 y.o. female is here for pessary check.   History of Present Illness: Mackenzie Green is a 78 y.o. female with stage III pelvic organ prolapse who presents for a pessary check. She is using a size #1 cube pessary. The pessary has been working well and she has no complaints. She is using vaginal estrogen. She reports mild vaginal bleeding.  Past Medical History: Patient  has a past medical history of Low iron, Mild cognitive impairment with memory loss, and Mitral valve prolapse.   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   Medications: She has a current medication list which includes the following prescription(s): biotin, calcium, cephalexin, cholecalciferol, cholestyramine, clobetasol prop emollient base, estradiol, ferrous sulfate, iron, fluconazole, fluzone high-dose quadrivalent, folic acid, magnesium citrate, vitamin k2, multivitamin adult, ascorbic acid, and vitamin k.   Allergies: Patient is allergic to dairy aid [tilactase].   Social History: Patient  reports that she has never smoked. She has never used smokeless tobacco. She reports current alcohol use. She reports that she does not use drugs.      Objective:    Physical Exam: BP 103/68   Pulse 76  Gen: No apparent distress, A&O x 3. Detailed Urogynecologic Evaluation:  Pelvic Exam: Normal external female genitalia; Bartholin's and Skene's glands normal in appearance; urethral meatus with caruncle, no urethral masses or discharge. The pessary was noted to be in place. It was removed and cleaned. Speculum exam revealed erythema in the vagina. Patient wanted to practice placement and removal. We practiced removal and placement both standing and lying down and she was able to successfully  remove the cube pessary and re-insert. There was some vaginally bleeding with the second removal and replacement. She would like to plan to remove it herself once weekly and re-insert. The pessary was replaced. It was comfortable to the patient and fit well.     Assessment/Plan:    Assessment: Mackenzie Green is a 77 y.o. with stage III pelvic organ prolapse here for a pessary check. She is doing well.  Plan: She will remove once a week . She will continue to use estrogen. She will follow-up in 2 months for a pessary check or sooner as needed.   She is still having problems with periodic bleeding even with the use of estrogen cream. On exam, she was noted to have some erythematous spots on the back wall of the vagina, possibly related to pessary use. We are attempting to get an E-string for patient to address the vaginal bleeding as this can sit behind the pessary.   All questions were answered.

## 2023-08-07 ENCOUNTER — Ambulatory Visit: Payer: Medicare Other | Attending: Family Medicine

## 2023-08-07 DIAGNOSIS — M6281 Muscle weakness (generalized): Secondary | ICD-10-CM

## 2023-08-07 DIAGNOSIS — M25552 Pain in left hip: Secondary | ICD-10-CM

## 2023-08-07 DIAGNOSIS — M62838 Other muscle spasm: Secondary | ICD-10-CM | POA: Diagnosis not present

## 2023-08-07 DIAGNOSIS — R293 Abnormal posture: Secondary | ICD-10-CM | POA: Diagnosis not present

## 2023-08-07 DIAGNOSIS — R279 Unspecified lack of coordination: Secondary | ICD-10-CM | POA: Diagnosis not present

## 2023-08-07 NOTE — Therapy (Signed)
OUTPATIENT PHYSICAL THERAPY TREATMENT NOTE Progress Note Reporting Period 02/25/23 to 07/09/23  See note below for Objective Data and Assessment of Progress/Goals.      Patient Name: Mackenzie Green MRN: [id number] DOB:02-17-1946, 77 y.o., female Today's Date: 08/07/2023  PCP: Clayborn Heron, MD REFERRING PROVIDER: Moshe Cipro, NP  END OF SESSION:   PT End of Session - 08/07/23 1441     Visit Number 21    Date for PT Re-Evaluation 09/03/23    Authorization Type [employer] Medicare    PT Start Time 1442    PT Stop Time 1525    PT Time Calculation (min) 43 min    Activity Tolerance Patient tolerated treatment well    Behavior During Therapy WFL for tasks assessed/performed                      Past Medical History:  Diagnosis Date   Low iron    Mild cognitive impairment with memory loss    Mitral valve prolapse    Past Surgical History:  Procedure Laterality Date   TEAR DUCT PROBING     TOTAL HIP ARTHROPLASTY Left 03/27/2022   Procedure: LEFT POSTERIOR TOTAL HIP ARTHROPLASTY;  Surgeon: Joen Laura, MD;  Location: [facility] OR;  Service: Orthopedics;  Laterality: Left;   Patient Active Problem List   Diagnosis Date Noted   Hip fracture (HCC) 03/26/2022   Mild cognitive impairment with memory loss 03/26/2022   Depression with anxiety 03/26/2022   Spinal stenosis at L4-L5 level 03/26/2022   Neutropenia (HCC) 08/27/2016   Weight loss 07/31/2012   Hypokalemia 07/31/2012   Hyponatremia 07/30/2012   Hypoglycemia 07/30/2012    REFERRING DIAG: N81.10 (ICD-10-CM) - Cystocele, unspecified R15.9 (ICD-10-CM) - Full incontinence of feces  THERAPY DIAG:  Abnormal posture  Muscle weakness (generalized)  Unspecified lack of coordination  Other muscle spasm  Pain in left hip  Rationale for Evaluation and Treatment Rehabilitation  PERTINENT HISTORY: Lichens sclerosis, cystocele, hemorrhoids, diarrhea, Lt THA 04/25/22, spinal stenosis L4-5,  osteopenia/osteoporosis  PRECAUTIONS: NA  SUBJECTIVE:                                                                                                                                                                                      SUBJECTIVE STATEMENT: Pt states that she has been having less bleeding vaginally. She has learned how to remove and place pessary and is hoping that this will reduce the amount of discharge she is having. She feels like Lt hip is better. She had her dexa scan and reports that her hip is the same, but spine score was slightly worse. She would really  like to focus on core/pelvic floor exercises.    PAIN:  Are you having pain? Yes: NPRS scale:  /10 Pain location: Lt hip Pain description: stiff Aggravating factors: walking - unrelenting pain, standing Relieving factors: pain medication to some extent   11/19/22 SUBJECTIVE STATEMENT: Pt states that she has had issues with diarrhea and fecal incontinence; this is better after getting a pessary about a month and a half ago. She was having vaginal bleeding and was told this was from bladder prolapse; she has not had this since pessary was placed. She also had series of UTIs and a yeast infection. She is working on trying to build bone density right now due to fall and Lt hip replacement. She has been doing pelvic floor contractions 4x/day in various positions until MD told her her muscles were too far gone to do any good, therefore she stopped. She states that she is a little overwhelmed with all of her exercises for bones/muscles. She does also go to the pool. She is active and motivated. She is having consistent yellow/brown discharge since wearing pessary - she has talked with MD about it. Her pessary was placed and will remain in place until her 3 month appointment.  Fluid intake: Yes: not discussed in detail     PAIN:  Are you having pain? Yes NPRS scale: no provided this session Pain location:  Lt hip pain    Pain type: aching Pain description: intermittent    Aggravating factors: walking Relieving factors: rest   PRECAUTIONS: None   WEIGHT BEARING RESTRICTIONS: No   FALLS:  Has patient fallen in last 6 months? 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/25/22, spinal stenosis L4-5, osteopenia/osteoporosis Sexual abuse: No   BOWEL MOVEMENT: Pain with bowel movement: No Type of bowel movement:Frequency sometimes multiple times a day, sometimes just once and Strain No Fully empty rectum: Yes: fees empty, but then she still has to go Leakage: No - not in the last several weeks besides occasional leakage - at time of referral she was having bowel movement leakage outside of control every time she urinated  Pads: Yes: - Fiber supplement: No *uses wash cloth to clean   URINATION: Pain with urination: No Fully empty bladder: Yes: she states that she tries to Stream: Strong Urgency: Yes: does not hold urine well Frequency: 2x/night; every hour - unchanged since she got pessary Leakage: Urge to void and Walking to the bathroom Pads: Yes: goes through several a day, mostly for discharge    INTERCOURSE: Pain with intercourse:  not sexually active currently; history of pain since menopause     PREGNANCY: Vaginal deliveries 2 Tearing Yes: forceps delivery ; significant tearing     PROLAPSE: Cystocele had to press bladder back inside vaginal canal prior to pessary     OBJECTIVE:  07/09/23: Gait: Lt compensated trendelenburg Low back: flexion 75%, extension 50%, bil lateral flexion 75% Squat: Rt weight shift Single leg stance: >15 seconds bil, trunk placement over Lt LE with LT single leg stance for improved balance  5x STS: 18 seconds Palpation: tenderness  and restriction throughout Lt hip  04/23/23 Internal vaginal exam:  Strength 3/5 Endurance 10 seconds, holding longer she decreases contraction with inhale and then re-squeezes with exhale, cannot hold throughout inhale  Internal rectal exam: Lichens plaque noted Rt peri-anal area EAS 3/5 Pelvic floor 3/5 Bulge: initially paradoxical contraction; with cuing corrected and performed very well  02/11/23: Palpation: severe tenderness, muscle spasm/trigger points, scar tissue restriction over Lt hip  Balance: unable to stand on Lt LE Hip strength:  Rt: grossly 5/5 Lt: flexion 4/5, adduction 3/5, abduction 2/5 and pain, IR 3/5 and pain, ER 3/5 and pain  Pelvic floor strength: (Rectal exam) Pelvic floor strength 3/5 Pelvic floor endurance 22 seconds Active lichens sclerosis plaques present around anus with skin irritation  01/07/23: Grade 3 anterior vaginal wall laxity without pessary Pelvic floor strength 2/5, external anal sphincter 2/5 Difficulty with appropriate bearing down/bulge - able to perform balloon breathing with cuing  11/19/22:  COGNITION: Overall cognitive status: Within functional limits for tasks assessed                          SENSATION: Light touch: Appears intact Proprioception: Appears intact   MUSCLE LENGTH:     FUNCTIONAL TESTS:      GAIT: Lt trendelenburg   POSTURE: rounded shoulders, forward head, decreased lumbar lordosis, increased thoracic kyphosis, and posterior pelvic tilt   LUMBARAROM/PROM:     LOWER EXTREMITY MMT:     PALPATION:   General  mild abdominal doming with increased abdominal pressure                 External Perineal Exam: palor in tissues, fusion of labia minora with labia majora, bright red urethral tissue, red irritated issue in vestibule                             Internal Pelvic Floor no tenderness, pessary present   Patient confirms identification and approves PT to assess internal pelvic floor and treatment Yes    PELVIC MMT:   MMT eval  Vaginal 2/5, endurance 3 seconds, repeat contractions 4x  Internal Anal Sphincter    External Anal Sphincter    Puborectalis    Diastasis Recti WNL  (Blank rows = not tested)         TONE: low   PROLAPSE: Pessary present, but anterior vaginal wall laxity still noted with bear down, no rectocele appreciated with pessary   TODAY'S TREATMENT 08/07/23 Manual: Trigger Point Dry-Needling  Treatment instructions: Expect mild to moderate muscle soreness. S/S of pneumothorax if dry needled over a lung field, and to seek immediate medical attention should they occur. Patient verbalized understanding of these instructions and education.  Patient Consent Given: Yes Education handout provided: Yes Muscles treated: Lt TFL, vastus lateralis, adductors, and glutes Electrical stimulation performed: No Parameters: N/A Treatment response/outcome: twitch response and improved tension Soft tissue mobilization to low back/Lt hip Neuromuscular re-education: Cat cow 10x Squats with anterior weight hold 3lbs 5x Pallof press 5x bil green band Shoulder extensions 5x green band Bird dog 2x bil Side plank clam shell 5x bil Bridge with hip adduction 10x Bridge march 5x   07/09/23 RE-EVALUATION Manual: Trigger Point Dry-Needling  Treatment instructions: Expect mild to moderate muscle soreness. S/S of pneumothorax if dry needled over a lung field, and to seek immediate medical attention should they occur. Patient verbalized understanding of these instructions and education.  Patient Consent Given: Yes Education handout provided: Yes Muscles treated: Lt TFL, vastus lateralis, adductors, and glutes Electrical stimulation performed: No Parameters: N/A Treatment response/outcome: twitch response and improved tension Instrument assisted soft tissue mobilization  Lt hip Soft tissue mobilization to low back/Lt hip Therapeutic activities: Importance of diaphragmatic breathing  and relationship to posture and pelvic floor/prolapse   06/10/23 Manual: Trigger Point Dry-Needling  Treatment instructions: Expect mild to moderate muscle soreness. S/S of pneumothorax if dry needled over a lung field, and to seek immediate medical attention should they occur. Patient verbalized understanding of these instructions and education.  Patient Consent Given: Yes Education handout provided: Yes Muscles treated: Lt TFL, vastus lateralis, adductors, and glutes Electrical stimulation performed: No Parameters: N/A Treatment response/outcome: twitch response and improved tension Instrument assisted soft tissue mobilization Lt hip Soft tissue mobilization to low back/Lt hip Therapeutic activities: Impressing importance of pelvic floor long holds in addition to performing pelvic floor contractions in other exercises   PATIENT EDUCATION:  Education details: see above Person educated: Patient Education method: Explanation, Demonstration, Tactile cues, Verbal cues, and Handouts Education comprehension: verbalized understanding   HOME EXERCISE PROGRAM: Access Code: [email or web address]     ASSESSMENT:   CLINICAL IMPRESSION: Pt doing better in general with reduced Lt hip pain and improvement with pessary. Due to some increase in stiffness of Lt hip, dry needling and soft tissue mobilization performed with good tolerance. Pt requested consolidation of exercise program to small number of exercises that she can do daily in order to work on core strengthening. Good tolerance to all exercises with exception of side plank with clam shell; believe this will be a great exercise, but pt does have trouble getting into position when on Lt side due to pain lying on that side still - she is going to try with pillows under her. She will continue to benefit from skilled PT intervention in order to decrease Lt hip pain, improve urinary/bowel symptoms, perform pelvic floor strengthening program, and incorporate  pelvic floor into functional strengthening with appropriate pressure management.    OBJECTIVE IMPAIRMENTS: Abnormal gait, decreased activity tolerance, decreased coordination, decreased endurance, decreased strength, increased fascial restrictions, increased muscle spasms, impaired tone, postural dysfunction, and pain.    ACTIVITY LIMITATIONS: continence   PARTICIPATION LIMITATIONS: community activity   PERSONAL FACTORS: 3+ comorbidities: Lichens sclerosis, cystocele, hemorrhoids, diarrhea, Lt THA 04/25/22, spinal stenosis L4-5, osteopenia/osteoporosis  are also affecting patient's functional outcome.    REHAB POTENTIAL: Good   CLINICAL DECISION MAKING: Stable/uncomplicated   EVALUATION COMPLEXITY: Low     GOALS: Goals reviewed with patient? Yes   SHORT TERM GOALS: Target date: 12/17/22 - updated 12/25/22 - updated 02/05/23 new goal 04/22/2023  - updated 04/23/23 with new goal 07/02/23 - updated 06/10/23    Pt will be independent with HEP.    Baseline: Goal status: MET 02/11/23   2.  Pt will be able to correctly perform diaphragmatic breathing and appropriate pressure management in order to prevent worsening vaginal wall laxity and improve pelvic floor A/ROM.    Baseline:  Goal status: Met 02/11/23   3.  Pt will be independent with the knack, urge suppression technique, and double voiding in order to improve bladder habits and decrease urinary/fecal incontinence.    Baseline:  Goal status: MET 04/23/23     LONG TERM GOALS: Target date: 01/28/22 - updated 12/25/22 - updated 02/05/23 new goal 04/22/2023 - updated 04/23/23 with new goal 07/02/23 - updated 06/10/23 - updated 07/09/23   Pt will be independent with advanced HEP.    Baseline:  Goal status: IN PROGRESS   2.  Pt will demonstrate normal pelvic floor muscle tone and A/ROM, able to achieve 3/5 strength with contractions and 10  sec endurance, in order to provide appropriate lumbopelvic support in functional activities.    Baseline:  Goal  status: MET 02/11/23   3.  Pt will report improved sensation of control over urgency and continence with bowel movements without any episodes of fecal incontinence.  Baseline:  Goal status: IN PROGRESS   4.  Pt will be able to go 2-3 hours in between voids without urgency or incontinence in order to improve QOL and perform all functional activities with less difficulty.    Baseline:  Goal status: IN PROGRESS  5. Pt will demonstrate normal pelvic floor muscle tone and A/ROM, able to achieve 4/5 strength with contractions and 30 sec endurance, in order to provide appropriate lumbopelvic support in functional activities.    Baseline:  Goal status: IN PROGRESS  6. Pt will report no higher Lt hip pain during functional activities than 2/10 and be able to stand/walk for greater than 30 minutes without difficulty.    Baseline:  Goal status: IN PROGRESS  7. Pt will be able to perform 5x STS in under 14 seconds to demonstrate improved functional ability.    Baseline:  Goal status: INITIAL     PLAN:   PT FREQUENCY: 1x/week   PT DURATION: 8 weeks   PLANNED INTERVENTIONS: Therapeutic exercises, Therapeutic activity, Neuromuscular re-education, Balance training, Gait training, Patient/Family education, Self Care, Joint mobilization, Dry Needling, Biofeedback, and Manual therapy   PLAN FOR NEXT SESSION: Use of RUSI to help with diaphragmatic breathing; continue dry needling and manual techniques to help manage pain.      Julio Alm, PT, DPT08/15/243:30 PM

## 2023-08-21 ENCOUNTER — Ambulatory Visit (INDEPENDENT_AMBULATORY_CARE_PROVIDER_SITE_OTHER): Payer: Medicare Other | Admitting: Family

## 2023-08-21 ENCOUNTER — Encounter: Payer: Self-pay | Admitting: Family

## 2023-08-21 VITALS — BP 100/62 | HR 77 | Temp 98.2°F | Ht 65.35 in | Wt 110.2 lb

## 2023-08-21 DIAGNOSIS — K580 Irritable bowel syndrome with diarrhea: Secondary | ICD-10-CM | POA: Diagnosis not present

## 2023-08-21 DIAGNOSIS — M810 Age-related osteoporosis without current pathological fracture: Secondary | ICD-10-CM | POA: Diagnosis not present

## 2023-08-21 NOTE — Patient Instructions (Addendum)
Welcome to [facility], It was a pleasure meeting you today!   Continue taking your vitamin D3 2,000units daily.  You can take Calcium 300-600mg  daily, but eating calcium rich foods is better. Continue doing your weight bearing exercises daily. We can recheck a DEXA scan next year.  I will ask about a GI recommendation that is knowledgeable on latest IBS research and let you know.  Have a great labor day weekend!    PLEASE NOTE: If you had any LAB tests please let us know if you have not heard back within a few days. You may see your results on MyChart before we have a chance to review them but we will give you a call once they are reviewed by [place]. If we ordered any REFERRALS today, please let us know if you have not heard from their office within the next week.  Let us know through MyChart if you are needing REFILLS, or have your pharmacy send [place] the request. You can also use MyChart to communicate with me or any office staff.  Please try these tips to maintain a healthy lifestyle: It is important that you exercise regularly at least 30 minutes 5 times a week. Think about what you will eat, plan ahead. Choose whole foods, & think  "clean, green, fresh or frozen" over canned, processed or packaged foods which are more sugary, salty, and fatty. 70 to 75% of food eaten should be fresh vegetables and protein. 2-3  meals daily with healthy snacks between meals, but must be whole fruit, protein or vegetables. Aim to eat over a 10 hour period when you are active, for example, 7am to 5pm, and then STOP after your last meal of the day, drinking only water.  Shorter eating windows, 6-8 hours, are showing benefits in heart disease and blood sugar regulation. Drink water every day! Shoot for 64 ounces daily = 8 cups, no other drink is as healthy! Fruit juice is best enjoyed in a healthy way, by EATING the fruit.

## 2023-08-21 NOTE — Progress Notes (Unsigned)
New Patient Office Visit  Subjective:  Patient ID: Mackenzie Green, female    DOB: [birth year]  Age: 77 y.o. MRN: [id number]  CC:  Chief Complaint  Patient presents with  . New Patient (Initial Visit)  . Osteoporosis    Pt c/o osteoporosis, would like to discuss prolia injections.    HPI Mackenzie Green presents for establishing care today.  Osteoporosis:  last DEXA scan 07/28/2023 -worsening Osteoporosis -L radius -3.4; spine actually improved -1.3; no change in hips -2.1 = Osteopenia; FRAX  17% any non-hip; hip fx 4.5%;  Talked to Ortho about Evenity but insurance won't cover. Had stomach pain r/t Fosamax, she reports trying this a long time ago. Fx hip 03/2022;  Pt concerned about SE with Prolia. She reports working with PT and a trainer doing weight-bearing exercises, she has gained 10lbs, taking Vitamin D3 and calcium daily.  IBS:  reports cramping bloating & diarrhea. Has seen a functional medicine provider and states she also has a pessary device followed by Uro/Gynecology. She has diarrhea and leaky diarrhea which has led to UTIs, seeing a pelvic floor specialist. States she takes the Cholestyramine packets for 2-3 days which works, but does not help the leakage.   Assessment & Plan:  Age related osteoporosis, unspecified pathological fracture presence Assessment & Plan: chronic Fosamax - stomach pain; Evenity not covered concerned about Prolia SE, but is covered by insurance working w/trainer on weight bearing exercise  L radius worse -3.4; spine better at -1.3 (osteopenia); hips - 2.1 - & had hip fx 03/2022 since hip and spine scores are not osteoporotic, mutual decision to hold off on prolia for now, continue wt bearing exercise, Vit D3 & calcium, and recheck scan in 1 year (pt ins. had covered yearly even w/o tx) advised to increase Vit d to 4,000units daily and recheck level in 18m f/u 57m w/labs   Irritable bowel syndrome with diarrhea Assessment & Plan: chronic, since  college age has eliminated many foods and tried different meds currently cholestyramine packets work prn would like to discuss any new especially natural tx for IBS prefers to take prn vs a daily med would like a referral to GI that specializes in IBS will research and let her know f/u 2m or prn    Subjective:    Outpatient Medications Prior to Visit  Medication Sig Dispense Refill  . CALCIUM CITRATE PO Take by mouth.    . Cholecalciferol (VITAMIN D) 50 MCG (2000 UT) CAPS Take 1,000 Units by mouth daily.    . cholestyramine (QUESTRAN) 4 g packet Take 4 g by mouth 2 (two) times daily as needed (IBS).    . Clobetasol Prop Emollient Base (CLOBETASOL PROPIONATE E) 0.05 % emollient cream Apply to vulva and around rectum nightly for two weeks then twice a week after. 30 g 11  . estradiol (ESTRACE) 0.1 MG/GM vaginal cream Place 1 Applicatorful vaginally 2 (two) times a week.    . estradiol (ESTRING) 7.5 MCG/24HR vaginal ring Place 1 each vaginally every 3 (three) months. 1 each 3  . Ferrous Sulfate (IRON) 325 (65 Fe) MG TABS 1 tablet Orally Once a day    . FLUZONE HIGH-DOSE QUADRIVALENT 0.7 ML SUSY     . Lubricants (K-Y JELLY EX) Apply topically.    Marland Kitchen MAGNESIUM CITRATE PO Take 250 mg by mouth 2 (two) times daily.    . Menaquinone-7 (VITAMIN K2) 100 MCG CAPS     . Multiple Vitamin (MULTIVITAMIN ADULT) TABS Take 1  tablet by mouth daily.    . peppermint oil liquid by Does not apply route as needed.    . vitamin C (ASCORBIC ACID) 500 MG tablet Take 500 mg by mouth daily.    . vitamin k 100 MCG tablet     . Biotin 1 MG CAPS Biotin    . CALCIUM PO Take 1 tablet by mouth daily.    . Ferrous Sulfate (IRON PO) Take 1 tablet by mouth daily.    . fluconazole (DIFLUCAN) 150 MG tablet Take 150 mg by mouth once.    . Folic Acid 0.8 MG CAPS Folic Acid    . cephALEXin (KEFLEX) 500 MG capsule Take 500 mg by mouth 2 (two) times daily. (Patient not taking: Reported on 08/21/2023)     No  facility-administered medications prior to visit.   Past Medical History:  Diagnosis Date  . Hypokalemia 07/31/2012  . Hyponatremia 07/30/2012  . Low iron   . Mild cognitive impairment with memory loss   . Mitral valve prolapse    Past Surgical History:  Procedure Laterality Date  . TEAR DUCT PROBING    . TOTAL HIP ARTHROPLASTY Left 03/27/2022   Procedure: LEFT POSTERIOR TOTAL HIP ARTHROPLASTY;  Surgeon: Joen Laura, MD;  Location: [facility] OR;  Service: Orthopedics;  Laterality: Left;    Objective:   Today's Vitals: BP 100/62   Pulse 77   Temp 98.2 F (36.8 C) (Temporal)   Ht 5' 5.35" (1.66 m)   Wt 110 lb 3.2 oz (50 kg)   SpO2 97%   BMI 18.14 kg/m   Physical Exam Vitals and nursing note reviewed.  Constitutional:      Appearance: Normal appearance.  Cardiovascular:     Rate and Rhythm: Normal rate and regular rhythm.  Pulmonary:     Effort: Pulmonary effort is normal.     Breath sounds: Normal breath sounds.  Musculoskeletal:        General: Normal range of motion.  Skin:    General: Skin is warm and dry.  Neurological:     Mental Status: She is alert.  Psychiatric:        Mood and Affect: Mood normal.        Behavior: Behavior normal.    No orders of the defined types were placed in this encounter.   Dulce Sellar, NP

## 2023-08-22 DIAGNOSIS — K580 Irritable bowel syndrome with diarrhea: Secondary | ICD-10-CM | POA: Insufficient documentation

## 2023-08-22 DIAGNOSIS — M810 Age-related osteoporosis without current pathological fracture: Secondary | ICD-10-CM | POA: Insufficient documentation

## 2023-08-22 NOTE — Assessment & Plan Note (Signed)
chronic Fosamax - stomach pain; Evenity not covered concerned about Prolia SE, but is covered by insurance working w/trainer on weight bearing exercise  L radius worse -3.4; spine better at -1.3 (osteopenia); hips - 2.1 - & had hip fx 03/2022 since hip and spine scores are not osteoporotic, mutual decision to hold off on prolia for now, continue wt bearing exercise, Vit D3 & calcium, and recheck scan in 1 year (pt ins. had covered yearly even w/o tx) advised to increase Vit d to 4,000units daily and recheck level in 56m f/u 54m w/labs

## 2023-08-22 NOTE — Assessment & Plan Note (Addendum)
chronic, since college age has eliminated many foods and tried different meds currently cholestyramine packets work prn would like to discuss any new especially natural tx for IBS prefers to take prn vs a daily med would like a referral to GI that specializes in IBS will research and let her know, advised she has her functional med provider about this as well f/u 62m or prn

## 2023-08-28 ENCOUNTER — Ambulatory Visit (INDEPENDENT_AMBULATORY_CARE_PROVIDER_SITE_OTHER): Payer: Medicare Other | Admitting: Family

## 2023-08-28 ENCOUNTER — Encounter: Payer: Self-pay | Admitting: Family

## 2023-08-28 VITALS — BP 113/69 | HR 69 | Temp 97.5°F | Ht 65.35 in | Wt 108.2 lb

## 2023-08-28 DIAGNOSIS — M25572 Pain in left ankle and joints of left foot: Secondary | ICD-10-CM | POA: Diagnosis not present

## 2023-08-28 DIAGNOSIS — Z23 Encounter for immunization: Secondary | ICD-10-CM

## 2023-08-28 NOTE — Progress Notes (Signed)
Patient ID: Mackenzie Green, female    DOB: 1946-09-12, 77 y.o.   MRN: [id number]  Chief Complaint  Patient presents with   Ankle Pain    Pt c/o Left Ankle swelling/pain, Present for 10 days. Pt states it is painful. Has tried ibuprofen, elevation and ice which did not help.     HPI:      Ankle bone pain:  left lateral ankle swelling & pain. Pt c/o Left Ankle swelling/pain, Present for 10 days. Pt states it is painful. Has tried ibuprofen, elevation and ice which did not help. Pt reports she has been doing a sitting elliptical machine and other exercises in preparation for an upcoming trip to [place] where she will be doing a lot of walking.  Assessment & Plan:  1. Acute left ankle pain no edema noted today, mild pain with ankle bone palpation; advised to continue to elevate leg/ankle whenever resting, apply ice for up to tid prn, can wear mild compression socks up to the knee, and definitely should these socks if traveling by plane or long car rides. Can apply moleskin with center hole cut out around the ankle bone to alleviate pressure from shoes.  Do not overdo any exercise, start off slowly (warm-up) and gradually increased length of time or intensity, never past the point of pain or discomfort. Pt brought in several pairs of shoes and advised she wear the best shoe based on the terrain - hiking are good for any rough, gravel, or uneven surfaces, and more cushioned shoes for smoother surfaces or where longer distance walking is involved.  2. Immunization due  - Flu Vaccine QUAD High Dose(Fluad)   Subjective:    Outpatient Medications Prior to Visit  Medication Sig Dispense Refill   CALCIUM CITRATE PO Take by mouth.     Cholecalciferol (VITAMIN D) 50 MCG (2000 UT) CAPS Take 1,000 Units by mouth daily.     cholestyramine (QUESTRAN) 4 g packet Take 4 g by mouth 2 (two) times daily as needed (IBS).     Clobetasol Prop Emollient Base (CLOBETASOL PROPIONATE E) 0.05 % emollient  cream Apply to vulva and around rectum nightly for two weeks then twice a week after. 30 g 11   estradiol (ESTRACE) 0.1 MG/GM vaginal cream Place 1 Applicatorful vaginally 2 (two) times a week.     estradiol (ESTRING) 7.5 MCG/24HR vaginal ring Place 1 each vaginally every 3 (three) months. 1 each 3   Ferrous Sulfate (IRON) 325 (65 Fe) MG TABS 1 tablet Orally Once a day     FLUZONE HIGH-DOSE QUADRIVALENT 0.7 ML SUSY      Lubricants (K-Y JELLY EX) Apply topically.     MAGNESIUM CITRATE PO Take 250 mg by mouth 2 (two) times daily.     Menaquinone-7 (VITAMIN K2) 100 MCG CAPS      Multiple Vitamin (MULTIVITAMIN ADULT) TABS Take 1 tablet by mouth daily.     peppermint oil liquid by Does not apply route as needed.     vitamin C (ASCORBIC ACID) 500 MG tablet Take 500 mg by mouth daily.     vitamin k 100 MCG tablet      No facility-administered medications prior to visit.   Past Medical History:  Diagnosis Date   Hypokalemia 07/31/2012   Hyponatremia 07/30/2012   Low iron    Mild cognitive impairment with memory loss    Mitral valve prolapse    Past Surgical History:  Procedure Laterality Date   TEAR DUCT  PROBING     TOTAL HIP ARTHROPLASTY Left 03/27/2022   Procedure: LEFT POSTERIOR TOTAL HIP ARTHROPLASTY;  Surgeon: Joen Laura, MD;  Location: [facility] OR;  Service: Orthopedics;  Laterality: Left;   Allergies  Allergen Reactions   Dairy Aid [Tilactase]       Objective:    Physical Exam Vitals and nursing note reviewed.  Constitutional:      Appearance: Normal appearance.  Cardiovascular:     Rate and Rhythm: Normal rate and regular rhythm.     Pulses: Normal pulses.     Comments: Diffuse spider veins noted around top of left foot close to ankle. Pulmonary:     Effort: Pulmonary effort is normal.     Breath sounds: Normal breath sounds.  Musculoskeletal:        General: Normal range of motion.     Right lower leg: No edema.     Left lower leg: No edema.  Skin:    General:  Skin is warm and dry.  Neurological:     Mental Status: She is alert.  Psychiatric:        Mood and Affect: Mood normal.        Behavior: Behavior normal.    BP 113/69 (BP Location: Left Arm, Patient Position: Bed low/side rails up, Cuff Size: Normal)   Pulse 69   Temp (!) 97.5 F (36.4 C) (Temporal)   Ht 5' 5.35" (1.66 m)   Wt 108 lb 3.2 oz (49.1 kg)   SpO2 99%   BMI 17.81 kg/m  Wt Readings from Last 3 Encounters:  08/28/23 108 lb 3.2 oz (49.1 kg)  08/21/23 110 lb 3.2 oz (50 kg)  01/24/23 106 lb 12.8 oz (48.4 kg)       Dulce Sellar, NP

## 2023-09-04 ENCOUNTER — Ambulatory Visit: Payer: Medicare Other | Attending: Family Medicine

## 2023-09-04 DIAGNOSIS — R279 Unspecified lack of coordination: Secondary | ICD-10-CM | POA: Diagnosis not present

## 2023-09-04 DIAGNOSIS — M25552 Pain in left hip: Secondary | ICD-10-CM | POA: Insufficient documentation

## 2023-09-04 DIAGNOSIS — M62838 Other muscle spasm: Secondary | ICD-10-CM | POA: Diagnosis not present

## 2023-09-04 DIAGNOSIS — R293 Abnormal posture: Secondary | ICD-10-CM | POA: Diagnosis not present

## 2023-09-04 DIAGNOSIS — M6281 Muscle weakness (generalized): Secondary | ICD-10-CM | POA: Insufficient documentation

## 2023-09-04 NOTE — Therapy (Signed)
OUTPATIENT PHYSICAL THERAPY TREATMENT NOTE Progress Note Reporting Period 02/25/23 to 07/09/23  See note below for Objective Data and Assessment of Progress/Goals.      Patient Name: Mackenzie Green MRN: [id number] DOB:Oct 26, 1946, 77 y.o., female Today's Date: 09/04/2023  PCP: Clayborn Heron, MD REFERRING PROVIDER: Moshe Cipro, NP  END OF SESSION:   PT End of Session - 09/04/23 1020     Visit Number 22    Date for PT Re-Evaluation 10/30/23    Authorization Type [employer] Medicare    PT Start Time 1015    PT Stop Time 1055    PT Time Calculation (min) 40 min    Activity Tolerance Patient tolerated treatment well    Behavior During Therapy [facility] for tasks assessed/performed                      Past Medical History:  Diagnosis Date   Hypoglycemia 07/30/2012   Hypokalemia 07/31/2012   Hyponatremia 07/30/2012   Low iron    Mild cognitive impairment with memory loss    Mitral valve prolapse    Neutropenia (HCC) 08/27/2016   Past Surgical History:  Procedure Laterality Date   TEAR DUCT PROBING     TOTAL HIP ARTHROPLASTY Left 03/27/2022   Procedure: LEFT POSTERIOR TOTAL HIP ARTHROPLASTY;  Surgeon: Joen Laura, MD;  Location: [facility] OR;  Service: Orthopedics;  Laterality: Left;   Patient Active Problem List   Diagnosis Date Noted   Age related osteoporosis 08/22/2023   Irritable bowel syndrome with diarrhea 08/22/2023   Hip fracture (HCC) 03/26/2022   Mild cognitive impairment with memory loss 03/26/2022   Spinal stenosis at L4-L5 level 03/26/2022    REFERRING DIAG: N81.10 (ICD-10-CM) - Cystocele, unspecified R15.9 (ICD-10-CM) - Full incontinence of feces  THERAPY DIAG:  Abnormal posture  Muscle weakness (generalized)  Unspecified lack of coordination  Other muscle spasm  Pain in left hip  Rationale for Evaluation and Treatment Rehabilitation  PERTINENT HISTORY: Lichens sclerosis, cystocele, hemorrhoids, diarrhea, Lt THA 04/25/22,  spinal stenosis L4-5, osteopenia/osteoporosis  PRECAUTIONS: NA  SUBJECTIVE:                                                                                                                                                                                      SUBJECTIVE STATEMENT: Pt states that she leaves for [place] tomorrow and would like to have some dry needling today due to increase in stiffness in Lt hip. She has been very diligent about performing all exercises. She is not seeing any progress with fecal incontinence. She regularly is moving bowels in the morning, but then will still leak all day whenever  she urinates.    PAIN:  Are you having pain? Yes: NPRS scale: 4/10 Pain location: Lt hip Pain description: stiff Aggravating factors: walking - unrelenting pain, standing Relieving factors: pain medication to some extent   11/19/22 SUBJECTIVE STATEMENT: Pt states that she has had issues with diarrhea and fecal incontinence; this is better after getting a pessary about a month and a half ago. She was having vaginal bleeding and was told this was from bladder prolapse; she has not had this since pessary was placed. She also had series of UTIs and a yeast infection. She is working on trying to build bone density right now due to fall and Lt hip replacement. She has been doing pelvic floor contractions 4x/day in various positions until MD told her her muscles were too far gone to do any good, therefore she stopped. She states that she is a little overwhelmed with all of her exercises for bones/muscles. She does also go to the pool. She is active and motivated. She is having consistent yellow/brown discharge since wearing pessary - she has talked with MD about it. Her pessary was placed and will remain in place until her 3 month appointment.  Fluid intake: Yes: not discussed in detail     PAIN:  Are you having pain? Yes NPRS scale: no provided this session Pain location:  Lt hip pain   Pain  type: aching Pain description: intermittent    Aggravating factors: walking Relieving factors: rest   PRECAUTIONS: None   WEIGHT BEARING RESTRICTIONS: No   FALLS:  Has patient fallen in last 6 months? 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/25/22, spinal stenosis L4-5, osteopenia/osteoporosis Sexual abuse: No   BOWEL MOVEMENT: Pain with bowel movement: No Type of bowel movement:Frequency sometimes multiple times a day, sometimes just once and Strain No Fully empty rectum: Yes: fees empty, but then she still has to go Leakage: No - not in the last several weeks besides occasional leakage - at time of referral she was having bowel movement leakage outside of control every time she urinated  Pads: Yes: - Fiber supplement: No *uses wash cloth to clean   URINATION: Pain with urination: No Fully empty bladder: Yes: she states that she tries to Stream: Strong Urgency: Yes: does not hold urine well Frequency: 2x/night; every hour - unchanged since she got pessary Leakage: Urge to void and Walking to the bathroom Pads: Yes: goes through several a day, mostly for discharge    INTERCOURSE: Pain with intercourse:  not sexually active currently; history of pain since menopause     PREGNANCY: Vaginal deliveries 2 Tearing Yes: forceps delivery ; significant tearing     PROLAPSE: Cystocele had to press bladder back inside vaginal canal prior to pessary     OBJECTIVE:  07/09/23: Gait: Lt compensated trendelenburg Low back: flexion 75%, extension 50%, bil lateral flexion 75% Squat: Rt weight shift Single leg stance: >15 seconds bil, trunk placement over Lt LE with LT single leg stance for improved balance  5x STS: 18 seconds Palpation: tenderness and  restriction throughout Lt hip  04/23/23 Internal vaginal exam:  Strength 3/5 Endurance 10 seconds, holding longer she decreases contraction with inhale and then re-squeezes with exhale, cannot hold throughout inhale  Internal rectal exam: Lichens plaque noted  Rt peri-anal area EAS 3/5 Pelvic floor 3/5 Bulge: initially paradoxical contraction; with cuing corrected and performed very well  02/11/23: Palpation: severe tenderness, muscle spasm/trigger points, scar tissue restriction over Lt hip  Balance: unable to stand on Lt LE Hip strength:  Rt: grossly 5/5 Lt: flexion 4/5, adduction 3/5, abduction 2/5 and pain, IR 3/5 and pain, ER 3/5 and pain  Pelvic floor strength: (Rectal exam) Pelvic floor strength 3/5 Pelvic floor endurance 22 seconds Active lichens sclerosis plaques present around anus with skin irritation  01/07/23: Grade 3 anterior vaginal wall laxity without pessary Pelvic floor strength 2/5, external anal sphincter 2/5 Difficulty with appropriate bearing down/bulge - able to perform balloon breathing with cuing  11/19/22:  COGNITION: Overall cognitive status: Within functional limits for tasks assessed                          SENSATION: Light touch: Appears intact Proprioception: Appears intact   MUSCLE LENGTH:     FUNCTIONAL TESTS:      GAIT: Lt trendelenburg   POSTURE: rounded shoulders, forward head, decreased lumbar lordosis, increased thoracic kyphosis, and posterior pelvic tilt   LUMBARAROM/PROM:     LOWER EXTREMITY MMT:     PALPATION:   General  mild abdominal doming with increased abdominal pressure                 External Perineal Exam: palor in tissues, fusion of labia minora with labia majora, bright red urethral tissue, red irritated issue in vestibule                             Internal Pelvic Floor no tenderness, pessary present   Patient confirms identification and approves PT to assess internal pelvic floor and treatment Yes    PELVIC MMT:   MMT eval  Vaginal 2/5, endurance 3 seconds, repeat contractions 4x  Internal Anal Sphincter    External Anal Sphincter    Puborectalis    Diastasis Recti WNL  (Blank rows = not tested)         TONE: low   PROLAPSE: Pessary present, but anterior vaginal wall laxity still noted with bear down, no rectocele appreciated with pessary   TODAY'S TREATMENT 09/04/23 Manual: Trigger Point Dry-Needling  Treatment instructions: Expect mild to moderate muscle soreness. S/S of pneumothorax if dry needled over a lung field, and to seek immediate medical attention should they occur. Patient verbalized understanding of these instructions and education.  Patient Consent Given: Yes Education handout provided: Yes Muscles treated: Lt TFL, vastus lateralis, adductors, and glutes Electrical stimulation performed: No Parameters: N/A Treatment response/outcome: twitch response and improved tension Soft tissue mobilization to low back/Lt hip Negative pressure soft tissue mobilization to Lt hip/lateral thigh  08/07/23 Manual: Trigger Point Dry-Needling  Treatment instructions: Expect mild to moderate muscle soreness. S/S of pneumothorax if dry needled over a lung field, and to seek immediate medical attention should they occur. Patient verbalized understanding of these instructions and education.  Patient Consent Given: Yes Education handout provided: Yes Muscles treated: Lt TFL, vastus lateralis, adductors, and glutes Electrical stimulation performed: No Parameters: N/A Treatment response/outcome: twitch response and improved tension Soft tissue mobilization to low back/Lt hip Neuromuscular re-education: Cat cow 10x Squats with anterior weight hold 3lbs 5x Pallof press 5x bil green band Shoulder extensions 5x green band Bird dog 2x bil Side plank clam shell 5x bil Bridge with hip adduction 10x  Bridge march 5x   07/09/23 RE-EVALUATION Manual: Trigger Point Dry-Needling   Treatment instructions: Expect mild to moderate muscle soreness. S/S of pneumothorax if dry needled over a lung field, and to seek immediate medical attention should they occur. Patient verbalized understanding of these instructions and education.  Patient Consent Given: Yes Education handout provided: Yes Muscles treated: Lt TFL, vastus lateralis, adductors, and glutes Electrical stimulation performed: No Parameters: N/A Treatment response/outcome: twitch response and improved tension Instrument assisted soft tissue mobilization Lt hip Soft tissue mobilization to low back/Lt hip Therapeutic activities: Importance of diaphragmatic breathing and relationship to posture and pelvic floor/prolapse  PATIENT EDUCATION:  Education details: see above Person educated: Patient Education method: Explanation, Demonstration, Tactile cues, Verbal cues, and Handouts Education comprehension: verbalized understanding   HOME EXERCISE PROGRAM: Access Code: [email or web address]     ASSESSMENT:   CLINICAL IMPRESSION: Pt is seeing worsening fecal incontinence even though she is working very consistently with HEP focusing on pelvic floor/core strengthening and coordination. She is also managing with LT hip pain very well, having increased her functional ability. Believe that ongoing soreness in LT hip may be typical of significant fall/fracture with subsequent THA. We discussed that if she continues to have bothersome soreness that is impacting functional ability in the future, she should return to orthopedic pelvic floor physical therapy. We had believed that there was a strong relationship between pelvic floor dysfunction and Lt THA, but we have not seen progress with pelvic floor function as she has progressed hip strengthening and seen decrease in pain. Due to lack of progress with pelvic floor physical therapy in reducing urinary urgency and fecal incontinence, we are discharging at this time. We discussed that she is  welcome to return to physical therapy in the future, especially if there are changes in her medical status as she pursues further medical assistance that would allow her to make more progress with strengthening. She was encouraged to call with any questions or concerns.    OBJECTIVE IMPAIRMENTS: Abnormal gait, decreased activity tolerance, decreased coordination, decreased endurance, decreased strength, increased fascial restrictions, increased muscle spasms, impaired tone, postural dysfunction, and pain.    ACTIVITY LIMITATIONS: continence   PARTICIPATION LIMITATIONS: community activity   PERSONAL FACTORS: 3+ comorbidities: Lichens sclerosis, cystocele, hemorrhoids, diarrhea, Lt THA 04/25/22, spinal stenosis L4-5, osteopenia/osteoporosis  are also affecting patient's functional outcome.    REHAB POTENTIAL: Good   CLINICAL DECISION MAKING: Stable/uncomplicated   EVALUATION COMPLEXITY: Low     GOALS: Goals reviewed with patient? Yes   SHORT TERM GOALS: Target date: 12/17/22 - updated 12/25/22 - updated 02/05/23 new goal 04/22/2023  - updated 04/23/23 with new goal 07/02/23 - updated 06/10/23    Pt will be independent with HEP.    Baseline: Goal status: MET 02/11/23   2.  Pt will be able to correctly perform diaphragmatic breathing and appropriate pressure management in order to prevent worsening vaginal wall laxity and improve pelvic floor A/ROM.    Baseline:  Goal status: Met 02/11/23   3.  Pt will be independent with the knack, urge suppression technique, and double voiding in order to improve bladder habits and decrease urinary/fecal incontinence.    Baseline:  Goal status: MET 04/23/23     LONG TERM GOALS: Target date: 01/28/22 - updated 12/25/22 - updated 02/05/23 new goal 04/22/2023 - updated 04/23/23 with new goal 07/02/23 - updated 06/10/23 - updated 07/09/23 - updated 09/04/23   Pt will be independent with advanced HEP.    Baseline:  Goal  status: MET 09/04/23   2.  Pt will demonstrate  normal pelvic floor muscle tone and A/ROM, able to achieve 3/5 strength with contractions and 10 sec endurance, in order to provide appropriate lumbopelvic support in functional activities.    Baseline:  Goal status: MET 02/11/23   3.  Pt will report improved sensation of control over urgency and continence with bowel movements without any episodes of fecal incontinence.  Baseline:  Goal status: DISCHARGED   4.  Pt will be able to go 2-3 hours in between voids without urgency or incontinence in order to improve QOL and perform all functional activities with less difficulty.    Baseline:  Goal status: DISCHARGED  5. Pt will demonstrate normal pelvic floor muscle tone and A/ROM, able to achieve 4/5 strength with contractions and 30 sec endurance, in order to provide appropriate lumbopelvic support in functional activities.    Baseline:  Goal status: DISCHARGED  6. Pt will report no higher Lt hip pain during functional activities than 2/10 and be able to stand/walk for greater than 30 minutes without difficulty.    Baseline:  Goal status: IN DISCHARGED  7. Pt will be able to perform 5x STS in under 14 seconds to demonstrate improved functional ability.    Baseline:  Goal status: DISCHARGED     PLAN:   PT FREQUENCY: D/C   PT DURATION: D/C   PLANNED INTERVENTIONS: D/C   PLAN FOR NEXT SESSION: D/C   PHYSICAL THERAPY DISCHARGE SUMMARY  Visits from Start of Care: 22  Current functional level related to goals / functional outcomes: Not met, but progress, independence with current HEP   Remaining deficits: See above   Education / Equipment: HEP   Patient agrees to discharge. Patient goals were partially met. Patient is being discharged due to lack of progress.     Julio Alm, PT, DPT09/11/2409:22 AM

## 2023-09-16 ENCOUNTER — Encounter: Payer: Self-pay | Admitting: Obstetrics and Gynecology

## 2023-09-16 ENCOUNTER — Telehealth: Payer: Self-pay | Admitting: Gastroenterology

## 2023-09-16 DIAGNOSIS — M25572 Pain in left ankle and joints of left foot: Secondary | ICD-10-CM | POA: Diagnosis not present

## 2023-09-16 DIAGNOSIS — Z23 Encounter for immunization: Secondary | ICD-10-CM | POA: Diagnosis not present

## 2023-09-16 NOTE — Addendum Note (Signed)
Addended by: Candie Chroman on: 09/16/2023 11:17 AM   Modules accepted: Orders

## 2023-09-16 NOTE — Telephone Encounter (Signed)
Patient wishing to be seen for fecal incontinence with Dr. Lavon Paganini. Patient states she wishes for Dr. Lavon Paganini to review her pelvic floor therapy records as well. Patient will have records sent over for review.

## 2023-09-16 NOTE — Telephone Encounter (Addendum)
LVM, need to know if any specific provider she is requesting and reason for TOC. Last colonoscopy in 10/2021 with Dr. Bosie Clos.

## 2023-09-18 ENCOUNTER — Ambulatory Visit (INDEPENDENT_AMBULATORY_CARE_PROVIDER_SITE_OTHER): Payer: Medicare Other | Admitting: Family

## 2023-09-18 ENCOUNTER — Encounter: Payer: Self-pay | Admitting: Family

## 2023-09-18 VITALS — BP 99/62 | HR 70 | Temp 98.0°F | Ht 65.35 in | Wt 107.6 lb

## 2023-09-18 DIAGNOSIS — K580 Irritable bowel syndrome with diarrhea: Secondary | ICD-10-CM

## 2023-09-18 DIAGNOSIS — R159 Full incontinence of feces: Secondary | ICD-10-CM

## 2023-09-18 MED ORDER — ALOSETRON HCL 0.5 MG PO TABS: 0.5000 mg | ORAL_TABLET | Freq: Two times a day (BID) | ORAL | 0 refills | Status: DC

## 2023-09-18 NOTE — Assessment & Plan Note (Addendum)
chronic, since college age has eliminated many foods and tried different meds currently cholestyramine packets work prn would like a referral to GI that specializes in IBS will research and let her know, advised she has her functional med provider about this as well sending new referral sending RX for Lotronex 0.5mg  bid f/u 52m

## 2023-09-18 NOTE — Progress Notes (Signed)
Patient ID: Mackenzie Green, female    DOB: Jan 07, 1946, 77 y.o.   MRN: [id number]  Chief Complaint  Patient presents with   GI Problem    Pt c/o abdominal pain, diarrhea and bowel incontinence. Present for months.    *Discussed the use of AI scribe software for clinical note transcription with the patient, who gave verbal consent to proceed.  History of Present Illness   The patient, with a known history of IBS, presents with chronic diarrhea for many years and newer sx of rectal leakage. The patient reports that these symptoms have significantly impacted their quality of life. The patient describes the diarrhea as severe and sometimes urgent, which has led to embarrassing situations during travel. Despite attempts to manage the symptoms with over-the-counter remedies such as peppermint oil, teas, and cholestyramine, the patient reports no significant improvement. The patient also reports rectal leakage for the last several months, which occurs even when not experiencing diarrhea. Her rectum/sphincter is described as a constant pressure and movement, likened to a "little mouth of a fish." The patient has tried pelvic floor exercises and fiber pills as recommended by a pelvic floor therapist, but these have not alleviated the symptoms. The patient also reports frequent gas and bloating. The patient's last colonoscopy, performed in 2022, was reported as normal. Her Uro/gynecologist has referred her to see another Uro/GYN, Dr. Kellie Shropshire, & pt has sent a message to her re her s/s to see if she can help, pt also called [facility] who said she could not be seen until Jan and needs referral. She is asking if any other referral or medication options are available.      Assessment & Plan:     IBS w/ Diarrhea - Severe episodes of diarrhea affecting quality of life. Current management with cholestyramine twice daily provides temporary relief. Patient has tried dietary modifications and probiotics with limited  success. Recent colonoscopy (November 2022) was normal. -Start Lotronex 0.5mg  twice daily for 4 weeks, then reassess. If not covered, can try Viberzi. -Refer to Gastroenterology for further evaluation and management.      Subjective:    Outpatient Medications Prior to Visit  Medication Sig Dispense Refill   CALCIUM CITRATE PO Take by mouth.     Cholecalciferol (VITAMIN D) 50 MCG (2000 UT) CAPS Take 1,000 Units by mouth daily.     cholestyramine (QUESTRAN) 4 g packet Take 4 g by mouth 2 (two) times daily as needed (IBS).     Clobetasol Prop Emollient Base (CLOBETASOL PROPIONATE E) 0.05 % emollient cream Apply to vulva and around rectum nightly for two weeks then twice a week after. 30 g 11   estradiol (ESTRACE) 0.1 MG/GM vaginal cream Place 1 Applicatorful vaginally 2 (two) times a week.     estradiol (ESTRING) 7.5 MCG/24HR vaginal ring Place 1 each vaginally every 3 (three) months. 1 each 3   Ferrous Sulfate (IRON) 325 (65 Fe) MG TABS 1 tablet Orally Once a day     Lubricants (K-Y JELLY EX) Apply topically.     MAGNESIUM CITRATE PO Take 250 mg by mouth 2 (two) times daily.     Menaquinone-7 (VITAMIN K2) 100 MCG CAPS      Multiple Vitamin (MULTIVITAMIN ADULT) TABS Take 1 tablet by mouth daily.     peppermint oil liquid by Does not apply route as needed.     vitamin C (ASCORBIC ACID) 500 MG tablet Take 500 mg by mouth daily.     vitamin k 100  MCG tablet      No facility-administered medications prior to visit.   Past Medical History:  Diagnosis Date   Hypoglycemia 07/30/2012   Hypokalemia 07/31/2012   Hyponatremia 07/30/2012   Low iron    Mild cognitive impairment with memory loss    Mitral valve prolapse    Neutropenia (HCC) 08/27/2016   Past Surgical History:  Procedure Laterality Date   TEAR DUCT PROBING     TOTAL HIP ARTHROPLASTY Left 03/27/2022   Procedure: LEFT POSTERIOR TOTAL HIP ARTHROPLASTY;  Surgeon: Joen Laura, MD;  Location: [facility] OR;  Service: Orthopedics;   Laterality: Left;   Allergies  Allergen Reactions   Dairy Aid [Tilactase]       Objective:    Physical Exam Vitals and nursing note reviewed.  Constitutional:      Appearance: Normal appearance.  Cardiovascular:     Rate and Rhythm: Normal rate and regular rhythm.  Pulmonary:     Effort: Pulmonary effort is normal.     Breath sounds: Normal breath sounds.  Musculoskeletal:        General: Normal range of motion.  Skin:    General: Skin is warm and dry.  Neurological:     Mental Status: She is alert.  Psychiatric:        Mood and Affect: Mood normal.        Behavior: Behavior normal.    BP 99/62 (BP Location: Left Arm, Patient Position: Sitting, Cuff Size: Normal)   Pulse 70   Temp 98 F (36.7 C) (Temporal)   Ht 5' 5.35" (1.66 m)   Wt 107 lb 9.6 oz (48.8 kg)   SpO2 97%   BMI 17.71 kg/m  Wt Readings from Last 3 Encounters:  09/18/23 107 lb 9.6 oz (48.8 kg)  08/28/23 108 lb 3.2 oz (49.1 kg)  08/21/23 110 lb 3.2 oz (50 kg)      Dulce Sellar, NP

## 2023-09-19 ENCOUNTER — Other Ambulatory Visit (HOSPITAL_COMMUNITY): Payer: Self-pay

## 2023-09-19 ENCOUNTER — Telehealth: Payer: Self-pay

## 2023-09-19 ENCOUNTER — Telehealth: Payer: Self-pay | Admitting: Gastroenterology

## 2023-09-19 NOTE — Telephone Encounter (Signed)
Good morning Dr. Lavon Paganini  We received a call from this patient wishing to transfer her care from Dr. Bosie Clos at [facility] to you. She is wanting to be evaluated for fecal incontinence. Her last colonoscopy was in 10/2021. Patient states she would also like for you to review her pelvic floor therapy records. Records are in Media for you to review. Would you please advise on scheduling?  Thank you.

## 2023-09-19 NOTE — Telephone Encounter (Signed)
-----   Message from [place] sent at 09/18/2023  3:00 PM EDT ----- Regarding: Lotronex PA Please start PA for Lotronex  for IBS pt has failed Cholestyramine, OTC supplements

## 2023-09-19 NOTE — Telephone Encounter (Signed)
Request received to transfer GI care from outside practice to [facility].  We appreciate the interest in our practice, however at this time due to high demand from patients without established GI providers we cannot accommodate this transfer.      

## 2023-09-19 NOTE — Telephone Encounter (Signed)
Pharmacy Patient Advocate Encounter   Received notification from Physician's Office that prior authorization for Alosetron HCl 0.5MG  tablets is required/requested.   Insurance verification completed.   The patient is insured through [facility] .   Per test claim: PA required; PA submitted to [facility] via CoverMyMeds Key/confirmation #/EOC  [id number] Status is pending

## 2023-09-19 NOTE — Telephone Encounter (Signed)
Pharmacy Patient Advocate Encounter  Received notification from [facility] that Prior Authorization for Alosetron HCl 0.5MG  tablets has been APPROVED from 09/19/23 to 12/12/23. Ran test claim, Copay is $29.70. This test claim was processed through [facility]- copay amounts may vary at other pharmacies due to pharmacy/plan contracts, or as the patient moves through the different stages of their insurance plan.   PA #/Case ID/Reference #: [id number]

## 2023-09-23 NOTE — Telephone Encounter (Signed)
Pt was advised.

## 2023-09-24 NOTE — Telephone Encounter (Signed)
Authorization Expiration Date: 12/12/2023

## 2023-09-25 NOTE — Telephone Encounter (Signed)
I called pt and lvm in regards.

## 2023-10-06 ENCOUNTER — Ambulatory Visit: Payer: Medicare Other | Admitting: Obstetrics and Gynecology

## 2023-10-08 ENCOUNTER — Ambulatory Visit: Payer: Medicare Other | Admitting: Obstetrics and Gynecology

## 2023-10-08 VITALS — BP 94/58 | HR 105

## 2023-10-08 DIAGNOSIS — N8110 Cystocele, unspecified: Secondary | ICD-10-CM

## 2023-10-08 DIAGNOSIS — R159 Full incontinence of feces: Secondary | ICD-10-CM | POA: Diagnosis not present

## 2023-10-08 DIAGNOSIS — R152 Fecal urgency: Secondary | ICD-10-CM | POA: Diagnosis not present

## 2023-10-08 DIAGNOSIS — N812 Incomplete uterovaginal prolapse: Secondary | ICD-10-CM

## 2023-10-08 DIAGNOSIS — N813 Complete uterovaginal prolapse: Secondary | ICD-10-CM

## 2023-10-08 NOTE — Progress Notes (Signed)
[facility]   Subjective:     Chief Complaint:  Chief Complaint  Patient presents with   Pessary Check    SHARNAY CASHION is a 77 y.o. female is here for pessary check.   History of Present Illness: ANNIEBELL BEDORE is a 77 y.o. female with stage III pelvic organ prolapse who presents for a pessary check. She is using a size #1 cube pessary. The pessary has been working well and she has no complaints. She is using vaginal estrogen. She denies vaginal bleeding.  Past Medical History: Patient  has a past medical history of Hypoglycemia (07/30/2012), Hypokalemia (07/31/2012), Hyponatremia (07/30/2012), Low iron, Mild cognitive impairment with memory loss, Mitral valve prolapse, and Neutropenia (HCC) (08/27/2016).   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   Medications: She has a current medication list which includes the following prescription(s): alosetron, calcium citrate, vitamin d, cholestyramine, clobetasol prop emollient base, estradiol, estradiol, iron, lubricants, magnesium citrate, vitamin k2, multivitamin adult, peppermint oil, probiotic product, ascorbic acid, and vitamin k.   Allergies: Patient is allergic to dairy aid [tilactase].   Social History: Patient  reports that she has never smoked. She has never used smokeless tobacco. She reports current alcohol use. She reports that she does not use drugs.      Objective:    Physical Exam: BP (!) 94/58   Pulse (!) 105  Gen: No apparent distress, A&O x 3. Detailed Urogynecologic Evaluation:  Pelvic Exam: Normal external female genitalia; Bartholin's and Skene's glands normal in appearance; urethral meatus with caruncle, no urethral masses or discharge. The pessary was noted to be in place. It was removed and cleaned. Speculum exam revealed no lesions in the vagina. The pessary was replaced. It was comfortable to the  patient and fit well.     Assessment/Plan:    Assessment: Ms. Vanburen is a 77 y.o. with stage III pelvic organ prolapse here for a pessary check. She is doing well.  Plan: She will remove once weekly . She will continue to use estrogen and Trimo-San. She will follow-up in 3 months for a pessary check or sooner as needed.   Patient reports she is still having issues with stool leakage and fecal incontinence.  She reports she has been taking cholestyramine which has been helping with her diarrhea issues.  We briefly discussed today the option of doing an Eclipse pessary for fecal incontinence, or considering sacral neuromodulation.  She was allowed to watch the video on sacral neuromodulation for bowel incontinence and we discussed information on a stage I trial if she was interested.  At this time she is planning to follow-up with GI in December and we can plan from there.  All questions were answered.

## 2023-10-10 ENCOUNTER — Ambulatory Visit (INDEPENDENT_AMBULATORY_CARE_PROVIDER_SITE_OTHER): Payer: Medicare Other | Admitting: Family

## 2023-10-10 ENCOUNTER — Encounter: Payer: Self-pay | Admitting: Family

## 2023-10-10 ENCOUNTER — Ambulatory Visit: Payer: Medicare Other | Admitting: Family

## 2023-10-10 VITALS — BP 110/68 | HR 87 | Wt 109.2 lb

## 2023-10-10 DIAGNOSIS — N3090 Cystitis, unspecified without hematuria: Secondary | ICD-10-CM

## 2023-10-10 LAB — POCT URINALYSIS DIPSTICK
Bilirubin, UA: NEGATIVE
Blood, UA: POSITIVE — AB
Glucose, UA: NEGATIVE
Ketones, UA: NEGATIVE
Nitrite, UA: NEGATIVE
Protein, UA: NEGATIVE
Spec Grav, UA: <=1.005 — AB (ref 1.010–1.025)
Urobilinogen, UA: 0.2 U/dL
pH, UA: 6 (ref 5.0–8.0)

## 2023-10-10 MED ORDER — NITROFURANTOIN MONOHYD MACRO 100 MG PO CAPS: 100.0000 mg | ORAL_CAPSULE | Freq: Two times a day (BID) | ORAL | 0 refills | Status: DC

## 2023-10-10 NOTE — Progress Notes (Signed)
Patient ID: Mackenzie Green, female    DOB: December 29, 1945, 78 y.o.   MRN: [id number]  Chief Complaint  Patient presents with   Dysuria    Pt c/o dysuria, itching and urinary frequency, present for a couple of days. Has tried home remedies which did help sx but sx came back at  am this morning.    *Discussed the use of AI scribe software for clinical note transcription with the patient, who gave verbal consent to proceed.  History of Present Illness   The patient presents with urinary symptoms, including burning during urination, urinary urgency, and pressure. She also reports possible cloudiness but no blood specks in the urine. The patient attempted to manage the symptoms with home remedies for three days, but the symptoms persisted. She also experienced a fever the previous day and low back pain. 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 The last infection was in April of this year, treated with Bactrim, but the symptoms returned after two weeks.       Assessment & Plan:     Urinary Tract Infection - Dysuria, hematuria, and low back pain. History of recurrent UTIs since a prior accident. Last UTI in April treated with Bactrim, but symptoms returned after 2 weeks. No known allergies to antibiotics. -Start Macrobid twice daily for 1 week. -Send urine for culture and sensitivity. -Advise to eat before taking medication and to maintain hydration. -Consider changing antibiotic based on culture results.      Subjective:    Outpatient Medications Prior to Visit  Medication Sig Dispense Refill   alosetron (LOTRONEX) 0.5 MG tablet Take 1 tablet (0.5 mg total) by mouth 2 (two) times daily. 60 tablet 0   CALCIUM CITRATE PO Take by mouth.     Cholecalciferol (VITAMIN D) 50 MCG (2000 UT) CAPS Take 1,000 Units by mouth daily.     cholestyramine (QUESTRAN) 4 g packet Take 4 g by  mouth 2 (two) times daily as needed (IBS).     Clobetasol Prop Emollient Base (CLOBETASOL PROPIONATE E) 0.05 % emollient cream Apply to vulva and around rectum nightly for two weeks then twice a week after. 30 g 11   estradiol (ESTRACE) 0.1 MG/GM vaginal cream Place 1 Applicatorful vaginally 2 (two) times a week.     estradiol (ESTRING) 7.5 MCG/24HR vaginal ring Place 1 each vaginally every 3 (three) months. 1 each 3   Ferrous Sulfate (IRON) 325 (65 Fe) MG TABS 1 tablet Orally Once a day     Lubricants (K-Y JELLY EX) Apply topically.     MAGNESIUM CITRATE PO Take 250 mg by mouth 2 (two) times daily.     Menaquinone-7 (VITAMIN K2) 100 MCG CAPS      Multiple Vitamin (MULTIVITAMIN ADULT) TABS Take 1 tablet by mouth daily.     peppermint oil liquid by Does not apply route as needed.     Probiotic Product (PROBIOTIC PO) Take by mouth.     vitamin C (ASCORBIC ACID) 500 MG tablet Take 500 mg by mouth daily.     vitamin k 100 MCG tablet      No facility-administered medications prior to visit.   Past Medical History:  Diagnosis Date   Hypoglycemia 07/30/2012   Hypokalemia 07/31/2012   Hyponatremia 07/30/2012   Low iron    Mild cognitive impairment with memory loss    Mitral valve prolapse  Neutropenia (HCC) 08/27/2016   Past Surgical History:  Procedure Laterality Date   TEAR DUCT PROBING     TOTAL HIP ARTHROPLASTY Left 03/27/2022   Procedure: LEFT POSTERIOR TOTAL HIP ARTHROPLASTY;  Surgeon: Joen Laura, MD;  Location: [facility];  Service: Orthopedics;  Laterality: Left;   Allergies  Allergen Reactions   Dairy Aid [Tilactase]       Objective:    Physical Exam Vitals and nursing note reviewed.  Constitutional:      Appearance: Normal appearance.  Cardiovascular:     Rate and Rhythm: Normal rate and regular rhythm.  Pulmonary:     Effort: Pulmonary effort is normal.     Breath sounds: Normal breath sounds.  Musculoskeletal:        General: Normal range of motion.   Skin:    General: Skin is warm and dry.  Neurological:     Mental Status: She is alert.  Psychiatric:        Mood and Affect: Mood normal.        Behavior: Behavior normal.    BP 110/68   Pulse 87   Wt 109 lb 3.2 oz (49.5 kg)   SpO2 97%   BMI 17.98 kg/m  Wt Readings from Last 3 Encounters:  10/10/23 109 lb 3.2 oz (49.5 kg)  09/18/23 107 lb 9.6 oz (48.8 kg)  08/28/23 108 lb 3.2 oz (49.1 kg)      Dulce Sellar, NP

## 2023-10-11 LAB — URINE CULTURE
MICRO NUMBER:: 15613834
Result:: NO GROWTH
SPECIMEN QUALITY:: ADEQUATE

## 2023-10-13 ENCOUNTER — Telehealth: Payer: Self-pay | Admitting: Family

## 2023-10-13 NOTE — Telephone Encounter (Signed)
FYI: This call has been transferred to triage nurse: the Triage Nurse. Once the result note has been entered staff can address the message at that time.  Patient called in with the following symptoms:  Red Word: New rash spreading from top of feet up both legs, red, warm to touch? Started new antibiotic on 10/18, Rash began  approx.10/12/23. Stopped taking antibiotic 10/12/23 pm dose.   Please advise at Mobile [phone] (mobile)  Message is routed to Provider Pool.

## 2023-10-13 NOTE — Telephone Encounter (Signed)
See message below    Patient Name First: Mackenzie Last: Green Gender: Female DOB: 04/12/1946 Age: 77 Y 3 M 5 D Return Phone Number: [phone] (Primary) Address: City/ State/ Zip: [place]  [address] Client [facility] - Administrator, [facility] at [facility] Contact Type Call Who Is Calling Patient / Member / Family / Caregiver Call Type Triage / Clinical Relationship To Patient Self Return Phone Number [phone] (Primary) Chief Complaint Medication reaction Reason for Call Symptomatic / Request for Health Information Initial Comment [name] has a rash, started a new medication, needs an appt. Translation No Nurse Assessment Nurse: Kathyrn Sheriff, RN, Judeth Cornfield Date/Time (Eastern Time): 10/13/2023 4:05:00 PM Confirm and document reason for call. If symptomatic, describe symptoms. ---[name] stated that she has cystitis and was started on Macrobid. [name] stated that she has a rash on her lower legs that started on Sunday. [name] stated that it is red, symmetrical, flat bumps that itch and cause mild pain. No fever. [name] stated that she is taking bendaryl and using hydrocortisone cream but rash is spreading. [name] denies any other symptoms. Does the patient have any new or worsening symptoms? ---Yes Will a triage be completed? ---Yes Related visit to physician within the last 2 weeks? ---Yes Does the PT have any chronic conditions? (i.e. diabetes, asthma, this includes High risk factors for pregnancy, etc.) ---No Is this a behavioral health or substance abuse call? ---No Guidelines Guideline Title Affirmed Question Affirmed Notes Nurse Date/Time (Eastern Time) Rash - Widespread On Drugs Hives or itching Hembree, RN, Judeth Cornfield 10/13/2023 4:08:03 PM Disp. Time Lamount Cohen Time) Disposition Final User 10/13/2023 4:15:31 PM See PCP within 24 Hours Yes Hembree, RN, Judeth Cornfield  Final Disposition 10/13/2023  4:15:31 PM See PCP within 24 Hours Yes Hembree, RN, Loura Halt Disagree/Comply Comply [name] Understands Yes PreDisposition Did not know what to do Care Advice Given Per Guideline SEE PCP WITHIN 24 HOURS: * IF OFFICE WILL BE OPEN: You need to be examined within the next 24 hours. Call your doctor (or NP/PA) when the office opens and make an appointment. STOP THE MEDICINE: * Severe itching and rash can be a sign of a drug allergy. * Stop the medicine until you are examined. ANTIHISTAMINE MEDICINES FOR HIVES OR FOR SEVERE ITCHING: * CETIRIZINE (REACTINE, ZYRTEC): The adult dose is 10 mg. You take it once a day. Cetirizine is available in the [place] as Zyrtec and in [place] as Reactine. * FEXOFENADINE (ALLEGRA): In the [place], the adult dose is one 24-hour tablet (180 mg) once a day. In [place], the adult dose is one 24-hour tablet (120 mg) once a day. Or, you can take one 12-hour (60 mg) tablet 2 times a day. * LORATADINE (ALAVERT, CLARITIN): The adult dose is 10 mg. You take it once a day. Loratadine is available in the [place] as Programmer, systems; it is available in [place] as Claritin. * Diphenhydramine (Benadryl) is a FIRST GENERATION ANTIHISTAMINE medicine. It can make you more sleepy than the newer second generation antihistamine medicines. The adult dose of Benadryl is 25 to 50 mg by mouth. You can take it up to 4 times a day. ANTIHISTAMINE MEDICINES - EXTRA NOTES AND WARNINGS: COOL BATH FOR ITCHING: * For flare-ups of itching, you can try taking a cool bath for 10 minutes. Caution: avoid any chill. Optional: add 2 oz of baking soda to the tub. * You can also rub very itchy areas with an ice  cube for 10 minutes. CALL BACK IF: * You become worse CARE ADVICE given per Rash - Widespread On Drugs (Adult) guideline  Referrals Warm transfer to backline REFERRED TO PCP OFFICE

## 2023-10-13 NOTE — Telephone Encounter (Signed)
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 Translation No Nurse Assessment Nurse: Mackenzie Bane, RN, Mackenzie Green Date/Time (Eastern Time): 10/12/2023 8:57:11 AM Confirm and document reason for call. If symptomatic, describe symptoms. ---[name] states she has a rash on her lower legs that she noticed this morning. No fever. She has been taking Nitrofurantoin since Friday. Does the patient have any new or worsening symptoms? ---Yes Will a triage be completed? ---Yes Related visit to physician within the last 2 weeks? ---Yes Does the PT have any chronic conditions? (i.e. diabetes, asthma, this includes High risk factors for pregnancy, etc.) ---No Is this a behavioral health or substance abuse call? ---No Guidelines Guideline Title Affirmed Question Affirmed Notes Nurse Date/Time (Eastern Time) Rash or Redness - Localized Mild localized rash Mackenzie Green 10/12/2023 8:59:14 AM Disp. Time Mackenzie Green Time) Disposition Final User 10/12/2023 8:40:23 AM Attempt made - message left Mackenzie Green 10/12/2023 9:05:55 AM Home Care Yes Mackenzie Bane, RN, Mackenzie Green Final Disposition 10/12/2023 9:05:55 AM Home Care Yes Mackenzie Bane, RN, Mackenzie Green Disagree/Comply Comply [name] Understands Yes PreDisposition Did not know what to do Care Advice Given Per  Guideline HOME CARE: * You should be able to treat this at home. REASSURANCE AND EDUCATION - MILD LOCALIZED RASH: * New rashes that are in one small (localized) area are usually due to skin contact with an irritating substance. AVOID THE CAUSE: * Consider irritants like a plant (such as evergreens), chemicals (such as solvents or insecticides), fiberglass, or a new cosmetic. * Wash the area once thoroughly with soap and water to remove any remaining irritants. Thereafter avoid soaps in this area. WASH THE AREA: HYDROCORTISONE CREAM FOR ITCHING: * You can use hydrocortisone for very itchy spots. * Put 1% hydrocortisone cream on the itchy area(s) 3 times a day. Use it for a couple days, until it feels better. This will help decrease the itching. CALL BACK IF: * Rash spreads or becomes worse CARE ADVICE given per Rash or Redness - Localized (Adult) guideline. * You become worse

## 2023-10-13 NOTE — Telephone Encounter (Signed)
Judeth Cornfield, triage nurse, called stating final outcome was to be seen with 24 hours. Patient has been scheduled for 10/22 @ 11:20 am w/ PCP. Awaiting triage note.

## 2023-10-14 ENCOUNTER — Ambulatory Visit (INDEPENDENT_AMBULATORY_CARE_PROVIDER_SITE_OTHER): Payer: Medicare Other | Admitting: Family

## 2023-10-14 ENCOUNTER — Encounter: Payer: Self-pay | Admitting: Family

## 2023-10-14 VITALS — BP 106/60 | HR 74 | Temp 98.0°F | Ht 65.35 in | Wt 106.4 lb

## 2023-10-14 DIAGNOSIS — L270 Generalized skin eruption due to drugs and medicaments taken internally: Secondary | ICD-10-CM

## 2023-10-14 NOTE — Progress Notes (Signed)
Patient ID: Mackenzie Green, female    DOB: 20-Aug-1946, 77 y.o.   MRN: [id number]  Chief Complaint  Patient presents with   Rash    Pt c/o rash on lower leg that she notice Sunday.    *Discussed the use of AI scribe software for clinical note transcription with the patient, who gave verbal consent to proceed.  History of Present Illness   The patient, with a recent history of symptoms r/t a urinary tract infection (UTI), presents with a rash on her legs. She believes the rash is an allergic reaction to Macrobid (nitrofurantoin), which she started taking recently for the UTI. The rash began two days after starting the medication and has been itchy, with some swelling. The patient has been managing the itchiness with hydrocortisone cream and oral Benadryl. She also mentions taking a probiotic for several weeks and going for a walk on a gravel path and mown grass prior to the rash's onset. The rash has been improving with the discontinuation of the antibiotic and the use of Benadryl and hydrocortisone cream.     Assessment & Plan:     Drug Reaction - Developed a rash after starting Macrobid (nitrofurantoin) for a UTI. The rash is localized to her lower legs, itchy, and associated with swelling. The rash has improved since discontinuing the medication. -Discontinue Macrobid and document as an allergy in the patient's medical record. -Continue Benadryl for symptomatic relief. -Consider triamcinolone cream for inflammation and itching if hydrocortisone is not effective.  Urinary Tract Infection - The patient was previously treated with Macrobid. The culture did not grow anything, suggesting that the infection may have resolved. -Monitor symptoms. No further antibiotics needed at this time.      Subjective:    Outpatient Medications Prior to Visit  Medication Sig Dispense Refill   alosetron (LOTRONEX) 0.5 MG tablet Take 1 tablet (0.5 mg total) by mouth 2 (two) times daily. 60 tablet 0    CALCIUM CITRATE PO Take by mouth.     Cholecalciferol (VITAMIN D) 50 MCG (2000 UT) CAPS Take 1,000 Units by mouth daily.     cholestyramine (QUESTRAN) 4 g packet Take 4 g by mouth 2 (two) times daily as needed (IBS).     Clobetasol Prop Emollient Base (CLOBETASOL PROPIONATE E) 0.05 % emollient cream Apply to vulva and around rectum nightly for two weeks then twice a week after. 30 g 11   estradiol (ESTRACE) 0.1 MG/GM vaginal cream Place 1 Applicatorful vaginally 2 (two) times a week.     estradiol (ESTRING) 7.5 MCG/24HR vaginal ring Place 1 each vaginally every 3 (three) months. 1 each 3   Ferrous Sulfate (IRON) 325 (65 Fe) MG TABS 1 tablet Orally Once a day     Lubricants (K-Y JELLY EX) Apply topically.     MAGNESIUM CITRATE PO Take 250 mg by mouth 2 (two) times daily.     Menaquinone-7 (VITAMIN K2) 100 MCG CAPS      Multiple Vitamin (MULTIVITAMIN ADULT) TABS Take 1 tablet by mouth daily.     nitrofurantoin, macrocrystal-monohydrate, (MACROBID) 100 MG capsule Take 1 capsule (100 mg total) by mouth 2 (two) times daily. 14 capsule 0   peppermint oil liquid by Does not apply route as needed.     Probiotic Product (PROBIOTIC PO) Take by mouth.     vitamin C (ASCORBIC ACID) 500 MG tablet Take 500 mg by mouth daily.     vitamin k 100 MCG tablet  No facility-administered medications prior to visit.   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  Constitutional:      Appearance: Normal appearance.  Cardiovascular:     Rate and Rhythm: Normal rate and regular rhythm.  Pulmonary:     Effort: Pulmonary effort is normal.     Breath sounds: Normal breath sounds.  Musculoskeletal:        General: Normal range of motion.     Right lower leg: 1+ Edema (ankle) present.     Left lower leg: 1+ Edema (ankle) present.  Skin:    General: Skin is warm and dry.     Findings: Rash (pinpoint red bump rash, diffuse on bilateral lower legs, a few pinkish-red small patchy areas on back of calves; no crusting or drainage noted; mild ankle  edema noted bilaterally) present.  Neurological:     Mental Status: She is alert.  Psychiatric:        Mood and Affect: Mood normal.        Behavior: Behavior normal.    BP 106/60   Pulse 74   Temp 98 F (36.7 C)   Ht 5' 5.35" (1.66 m)   Wt 106 lb 6.4 oz (48.3 kg)   SpO2 98%   BMI 17.52 kg/m  Wt Readings from Last 3 Encounters:  10/14/23 106 lb 6.4 oz (48.3 kg)  10/10/23 109 lb 3.2 oz (49.5 kg)  09/18/23 107 lb 9.6 oz (48.8 kg)      Dulce Sellar, NP

## 2023-10-29 DIAGNOSIS — H903 Sensorineural hearing loss, bilateral: Secondary | ICD-10-CM | POA: Diagnosis not present

## 2023-11-10 ENCOUNTER — Encounter: Payer: Self-pay | Admitting: Family

## 2023-11-10 ENCOUNTER — Ambulatory Visit (INDEPENDENT_AMBULATORY_CARE_PROVIDER_SITE_OTHER): Payer: Medicare Other | Admitting: Family

## 2023-11-10 VITALS — BP 104/68 | HR 76 | Temp 97.1°F | Ht 65.0 in | Wt 108.2 lb

## 2023-11-10 DIAGNOSIS — Z9889 Other specified postprocedural states: Secondary | ICD-10-CM | POA: Diagnosis not present

## 2023-11-10 DIAGNOSIS — M25552 Pain in left hip: Secondary | ICD-10-CM

## 2023-11-10 DIAGNOSIS — G8929 Other chronic pain: Secondary | ICD-10-CM

## 2023-11-10 NOTE — Progress Notes (Signed)
Patient ID: Mackenzie Green, female    DOB: 03-Dec-1946, 77 y.o.   MRN: [id number]  Chief Complaint  Patient presents with   Hip Pain    Would like a referral for hip pain to physical therapy  Left hip pain previous surgery     Discussed the use of AI scribe software for clinical note transcription with the patient, who gave verbal consent to proceed.  History of Present Illness   The patient, with a history of left hip fracture and subsequent hip replacement surgery, presents with persistent hip pain, stiffness, and soreness. The pain has been managed with dry needling therapy, which was administered by a pelvic floor physical therapist. The therapy was stopped two and a half months ago, and the patient has since noticed an increase in discomfort. The patient has been diligent with exercises and stretches recommended by previous physical therapists but reports no significant relief. The patient expresses a desire to resume dry needling therapy through an orthopedic specialist or therapist, as it was previously beneficial for symptom management.     Assessment & Plan:     Chronic left Hip Pain - Persistent stiffness and soreness in the hip following total hip replacement surgery. The patient previously found relief with dry needling and massage therapy, but has not had these treatments for the past two and a half months. -Refer to [facility] on [address] for dry needling and possible additional physical therapy exercises. -Continue current home exercises and stretches.  Follow-up The patient has a previously scheduled appointment for blood work. -Continue with the planned blood work on the scheduled date.     Subjective:    Outpatient Medications Prior to Visit  Medication Sig Dispense Refill   alosetron (LOTRONEX) 0.5 MG tablet Take 1 tablet (0.5 mg total) by mouth 2 (two) times daily. 60 tablet 0   CALCIUM CITRATE PO Take by mouth.     Cholecalciferol (VITAMIN D) 50 MCG  (2000 UT) CAPS Take 1,000 Units by mouth daily.     cholestyramine (QUESTRAN) 4 g packet Take 4 g by mouth 2 (two) times daily as needed (IBS).     Clobetasol Prop Emollient Base (CLOBETASOL PROPIONATE E) 0.05 % emollient cream Apply to vulva and around rectum nightly for two weeks then twice a week after. 30 g 11   estradiol (ESTRACE) 0.1 MG/GM vaginal cream Place 1 Applicatorful vaginally 2 (two) times a week.     estradiol (ESTRING) 7.5 MCG/24HR vaginal ring Place 1 each vaginally every 3 (three) months. 1 each 3   Ferrous Sulfate (IRON) 325 (65 Fe) MG TABS 1 tablet Orally Once a day     Lubricants (K-Y JELLY EX) Apply topically.     MAGNESIUM CITRATE PO Take 250 mg by mouth 2 (two) times daily.     Menaquinone-7 (VITAMIN K2) 100 MCG CAPS      Multiple Vitamin (MULTIVITAMIN ADULT) TABS Take 1 tablet by mouth daily.     nitrofurantoin, macrocrystal-monohydrate, (MACROBID) 100 MG capsule Take 1 capsule (100 mg total) by mouth 2 (two) times daily. 14 capsule 0   peppermint oil liquid by Does not apply route as needed.     Probiotic Product (PROBIOTIC PO) Take by mouth.     vitamin C (ASCORBIC ACID) 500 MG tablet Take 500 mg by mouth daily.     vitamin k 100 MCG tablet      No facility-administered medications prior to visit.   Past Medical History:  Diagnosis Date  Hypoglycemia 07/30/2012   Hypokalemia 07/31/2012   Hyponatremia 07/30/2012   Low iron    Mild cognitive impairment with memory loss    Mitral valve prolapse    Neutropenia (HCC) 08/27/2016   Past Surgical History:  Procedure Laterality Date   TEAR DUCT PROBING     TOTAL HIP ARTHROPLASTY Left 03/27/2022   Procedure: LEFT POSTERIOR TOTAL HIP ARTHROPLASTY;  Surgeon: Joen Laura, MD;  Location: [facility] OR;  Service: Orthopedics;  Laterality: Left;   Allergies  Allergen Reactions   Lactose Intolerance (Gi)     Milk    Macrobid [Nitrofurantoin] Rash      Objective:    Physical Exam Vitals and nursing note  reviewed.  Constitutional:      Appearance: Normal appearance.  Cardiovascular:     Rate and Rhythm: Normal rate and regular rhythm.  Pulmonary:     Effort: Pulmonary effort is normal.     Breath sounds: Normal breath sounds.  Musculoskeletal:        General: Normal range of motion.  Skin:    General: Skin is warm and dry.  Neurological:     Mental Status: She is alert.  Psychiatric:        Mood and Affect: Mood normal.        Behavior: Behavior normal.    BP 104/68   Pulse 76   Temp (!) 97.1 F (36.2 C) (Temporal)   Ht 5\' 5"  (1.651 m)   Wt 108 lb 3.2 oz (49.1 kg)   SpO2 97%   BMI 18.01 kg/m  Wt Readings from Last 3 Encounters:  11/10/23 108 lb 3.2 oz (49.1 kg)  10/14/23 106 lb 6.4 oz (48.3 kg)  10/10/23 109 lb 3.2 oz (49.5 kg)      Dulce Sellar, NP

## 2023-11-13 ENCOUNTER — Other Ambulatory Visit: Payer: Self-pay

## 2023-11-13 ENCOUNTER — Ambulatory Visit: Payer: Medicare Other | Admitting: Physical Therapy

## 2023-11-13 ENCOUNTER — Encounter: Payer: Self-pay | Admitting: Physical Therapy

## 2023-11-13 DIAGNOSIS — M6281 Muscle weakness (generalized): Secondary | ICD-10-CM | POA: Diagnosis not present

## 2023-11-13 DIAGNOSIS — M25552 Pain in left hip: Secondary | ICD-10-CM

## 2023-11-13 NOTE — Therapy (Signed)
OUTPATIENT PHYSICAL THERAPY LOWER EXTREMITY EVALUATION   Patient Name: Mackenzie Green MRN: [id number] DOB:November 19, 1946, 77 y.o., female Today's Date: 11/13/2023  END OF SESSION:  PT End of Session - 11/13/23 1027     Visit Number 1    Number of Visits 16    Date for PT Re-Evaluation 01/08/24    Authorization Type [employer] medicare    PT Start Time 1020    PT Stop Time 1100    PT Time Calculation (min) 40 min    Activity Tolerance Patient tolerated treatment well    Behavior During Therapy [facility] for tasks assessed/performed             Past Medical History:  Diagnosis Date   Hypoglycemia 07/30/2012   Hypokalemia 07/31/2012   Hyponatremia 07/30/2012   Low iron    Mild cognitive impairment with memory loss    Mitral valve prolapse    Neutropenia (HCC) 08/27/2016   Past Surgical History:  Procedure Laterality Date   TEAR DUCT PROBING     TOTAL HIP ARTHROPLASTY Left 03/27/2022   Procedure: LEFT POSTERIOR TOTAL HIP ARTHROPLASTY;  Surgeon: Joen Laura, MD;  Location: [facility] OR;  Service: Orthopedics;  Laterality: Left;   Patient Active Problem List   Diagnosis Date Noted   Age related osteoporosis 08/22/2023   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 She states good recovery after surgery, but did start having some bursitis type pain in the fall. She was having PT for this as well as pelvic floor. She stopped PT in September, and notes increased pain in the last couple months. She  has stayed active walking and doing exercises, some gym machines and some classes - chair yoga.  She has newer diagnosis of Oseoporosis, currently not taking medication for treatment for this. She has 1 step to enter her home.  States pain in  hip, feels tight, stiff, knotted.    PERTINENT HISTORY: L THA  April 2023   PAIN:  Are you having pain? Yes: NPRS scale: 3 /10 Pain location: L hip Pain description: tight, stiff, knot Aggravating factors: walking, increased activity  Relieving factors: none stated   PRECAUTIONS: None  WEIGHT BEARING RESTRICTIONS: No  FALLS:  Has patient fallen in last 6 months? No  PLOF: Independent  PATIENT GOALS:  Decreased pain   NEXT MD VISIT:   OBJECTIVE:   DIAGNOSTIC FINDINGS:   PATIENT SURVEYS:    COGNITION: Overall cognitive status: Within functional limits for tasks assessed     SENSATION: WFL  EDEMA:   POSTURE:      PALPATION:  Most Tenderness in L gr troch, and mild soreness in glute/rotators    LOWER EXTREMITY ROM:  Lumbar: WFL Hips: R: WFL,  L: mild limitation for ER, IR Knees: WFL  LOWER EXTREMITY MMT:  MMT Left eval Right  eval  Hip flexion 3 (noted IR position) 4+  Hip extension    Hip abduction 4-   Hip adduction    Hip internal rotation    Hip  external rotation    Knee flexion 4 5  Knee extension 4- 4+  Ankle dorsiflexion    Ankle plantarflexion    Ankle inversion    Ankle eversion     (Blank rows = not tested)  LOWER EXTREMITY SPECIAL TESTS:     GAIT:    TODAY'S TREATMENT:                                                                                                                              DATE:   11/13/2023  Ther ex: see below for HEP.   PATIENT EDUCATION:  Education details: PT POC, Exam findings, HEP Person educated: Patient Education method: Explanation, Demonstration, Tactile cues, Verbal cues, and Handouts Education comprehension: verbalized understanding, returned  demonstration, verbal cues required, tactile cues required, and needs further education   HOME EXERCISE PROGRAM: Access Code: [email or web address] URL: [email or web address] Date: 11/13/2023 Prepared by: Sedalia Muta  Exercises - Clamshell  - 1 x daily - 1-2 sets - 10 reps - Straight Leg Raise  - 1 x daily - 2 sets - 5 reps - Seated Knee Extension AROM  - 1 x daily - 1-2 sets - 10 reps  ASSESSMENT:   CLINICAL IMPRESSION: Patient presents with primary complaint of  pain in L hip. She has significant weakness with testing today for anterior hip >lateral hip, which is likely contributing to her pain with activity. She will benefit from education on HEP and strengthening for hip, to improve mechanics and pain with standing and functional activities.Pt with decreased ability and tolerance for functional activities, IADLS, stairs, and walking. Pt will  benefit from skilled PT to improve deficits and pain and to return to PLOF.   OBJECTIVE IMPAIRMENTS: Abnormal gait, decreased activity tolerance, decreased mobility, decreased ROM, decreased strength, increased muscle spasms, improper body mechanics, and pain.   ACTIVITY LIMITATIONS: lifting, bending, standing, squatting, stairs, transfers, hygiene/grooming, and locomotion level  PARTICIPATION LIMITATIONS: cleaning, driving, and community activity  PERSONAL FACTORS: Past/current experiences and Time since onset of injury/illness/exacerbation are also affecting patient's functional outcome.   REHAB POTENTIAL: Good  CLINICAL DECISION MAKING: Stable/uncomplicated  EVALUATION COMPLEXITY: Low   GOALS: Goals reviewed with patient? Yes  SHORT TERM GOALS: Target date: 11/27/2023  Pt to be independent with initial HEP  Goal status: INITIAL  2.  Pt to demo ability for SLR with optimal mechanics (strength to at least 3/5)   Goal status: INITIAL   LONG TERM GOALS: Target date: 01/08/2024  Pt to be independent with final HEP  Goal  status: INITIAL  2.  Pt to demo improved strength of L hip to be at least 4/5 to improve stability, stairs, and gait   Goal status: INITIAL  3.  Pt to report decreased pain in L hip to 0-2/10 with activity.   Goal status: INITIAL  4.  Pt to improve ability for stairs climbing without pain or deficit, with 1 UE support.   Goal status: INITIAL  PLAN:  PT FREQUENCY: 1-2x/week  PT DURATION: 8 weeks  PLANNED INTERVENTIONS: Therapeutic exercises, Therapeutic activity, Neuromuscular re-education, Patient/Family education, Self Care, Joint mobilization, Joint manipulation, Stair training, Orthotic/Fit training, DME instructions, Aquatic Therapy, Dry Needling, Electrical stimulation, Cryotherapy, Moist heat, Taping, Ultrasound, Ionotophoresis 4mg /ml Dexamethasone, Manual therapy,  Vasopneumatic device, Traction, Spinal manipulation, Spinal mobilization,Balance training, Gait training,   PLAN FOR NEXT SESSION:    Sedalia Muta, PT, DPT 1:28 PM  11/13/23

## 2023-11-19 ENCOUNTER — Ambulatory Visit: Payer: Medicare Other | Admitting: Physical Therapy

## 2023-11-19 ENCOUNTER — Telehealth: Payer: Self-pay | Admitting: Family

## 2023-11-19 ENCOUNTER — Encounter: Payer: Self-pay | Admitting: Physical Therapy

## 2023-11-19 DIAGNOSIS — R293 Abnormal posture: Secondary | ICD-10-CM

## 2023-11-19 DIAGNOSIS — M25552 Pain in left hip: Secondary | ICD-10-CM

## 2023-11-19 DIAGNOSIS — M6281 Muscle weakness (generalized): Secondary | ICD-10-CM | POA: Diagnosis not present

## 2023-11-19 NOTE — Telephone Encounter (Signed)
Patient requesting to have labs done prior to appt on 11/26/23 . Please advise

## 2023-11-19 NOTE — Therapy (Signed)
OUTPATIENT PHYSICAL THERAPY LOWER EXTREMITY Treatment   Patient Name: Mackenzie Green MRN: [id number] DOB:[birth year], 77 y.o., female Today's Date: 11/19/2023  END OF SESSION:  PT End of Session - 11/19/23 1144     Visit Number 2    Number of Visits 16    Date for PT Re-Evaluation 01/08/24    Authorization Type [employer] medicare    PT Start Time 1147    PT Stop Time 1228    PT Time Calculation (min) 41 min    Activity Tolerance Patient tolerated treatment well    Behavior During Therapy [facility] for tasks assessed/performed             Past Medical History:  Diagnosis Date   Hypoglycemia 07/30/2012   Hypokalemia 07/31/2012   Hyponatremia 07/30/2012   Low iron    Mild cognitive impairment with memory loss    Mitral valve prolapse    Neutropenia (HCC) 08/27/2016   Past Surgical History:  Procedure Laterality Date   TEAR DUCT PROBING     TOTAL HIP ARTHROPLASTY Left 03/27/2022   Procedure: LEFT POSTERIOR TOTAL HIP ARTHROPLASTY;  Surgeon: Joen Laura, MD;  Location: [facility] OR;  Service: Orthopedics;  Laterality: Left;   Patient Active Problem List   Diagnosis Date Noted   Age related osteoporosis 08/22/2023   Irritable bowel syndrome with diarrhea 08/22/2023   Hip fracture (HCC) 03/26/2022   Mild cognitive impairment with memory loss 03/26/2022   Spinal stenosis at L4-L5 level 03/26/2022    PCP: Dulce Sellar  REFERRING PROVIDER: Dulce Sellar  REFERRING DIAG: L hip pain  THERAPY DIAG:  Pain in left hip  Muscle weakness (generalized)  Abnormal posture  Rationale for Evaluation and Treatment: Rehabilitation  ONSET DATE: April 2023   SUBJECTIVE:   SUBJECTIVE STATEMENT: 11/19/2023 States she has done the exercises everyday. Reports that they are really hard. States that they hurt. States she is exhausted after her leg lifts.  Pain doesn't last. States she feels like she can walk a little better but is still getting the little lump in her hip.    Eval: Pt had fall in April 2023, with hip fracture, and subsequent THA. She states good recovery after surgery, but did start having some bursitis type pain in the fall. She was having PT for this as well as pelvic floor. She stopped PT in September, and notes increased pain in the last couple months. She has stayed active walking and doing exercises, some gym machines and some classes - chair yoga.  She has newer diagnosis of Oseoporosis, currently not taking medication for treatment for this. She has 1 step to enter her home.  States pain in  hip, feels tight, stiff, knotted.    PERTINENT HISTORY: L THA  April 2023   PAIN:  Are you having pain? Yes: NPRS scale: 0 /10 Pain location: L hip Pain description: tight, stiff, knot Aggravating factors: walking, increased activity  Relieving factors: none stated   PRECAUTIONS: None  WEIGHT BEARING RESTRICTIONS: No  FALLS:  Has patient fallen in last 6 months? No  PLOF: Independent  PATIENT GOALS:  Decreased pain   NEXT MD VISIT:   OBJECTIVE:   DIAGNOSTIC FINDINGS:   PATIENT SURVEYS:    COGNITION: Overall cognitive status: Within functional limits for tasks assessed     SENSATION: WFL  EDEMA:   POSTURE:      PALPATION:  Most Tenderness in L gr troch, and mild soreness in glute/rotators    LOWER EXTREMITY ROM:  Lumbar: WFL Hips: R: WFL,  L: mild limitation for ER, IR Knees: WFL  LOWER EXTREMITY MMT:  MMT Left eval Right  eval  Hip flexion 3 (noted IR position) 4+  Hip extension    Hip abduction 4-   Hip adduction    Hip internal rotation    Hip external rotation    Knee flexion 4 5  Knee extension 4- 4+  Ankle dorsiflexion    Ankle plantarflexion    Ankle inversion    Ankle eversion     (Blank rows = not tested)  LOWER EXTREMITY SPECIAL TESTS:     GAIT:    TODAY'S TREATMENT:                                                                                                                               DATE:   11/19/2023  Therapeutic Exercise:  S/l: clamshells x10 L Supine: ASLR L - favors groin - PT assist after PROM of left hip - tolerated better 4x5 B, bridges - difficult to perform with proper muscle activation - cues for pelvic tilt - did not help - stopped exercise Prone: long leg hip extension - mostly hamstring activation - knee bent - tolerated better - tactile and verbal cues. 6x3 B  Seated: LAQs x10 L   Standing: Neuromuscular Re-education: Manual Therapy:percussion to left hip - tolerated well - used to reduce pain between  Therapeutic Activity: Self Care: Trigger Point Dry Needling:  Modalities:    PATIENT EDUCATION:  Education details: HEP Person educated: Patient Education method: Programmer, multimedia, Demonstration, Actor cues, Verbal cues, and Handouts Education comprehension: verbalized understanding, returned demonstration, verbal cues required, tactile cues required, and needs further education   HOME EXERCISE PROGRAM: Access Code: [email or web address] URL: [email or web address] Date: 11/13/2023 Prepared by: Sedalia Muta  Exercises - Clamshell  - 1 x daily - 1-2 sets - 10 reps - Straight Leg Raise  - 1 x daily - 2 sets - 5 reps - Seated Knee Extension AROM  - 1 x daily - 1-2 sets - 10 reps  ASSESSMENT:   CLINICAL IMPRESSION: 11/19/2023 Session challenging as patient with poor proprioception. This improved with verbal and tactile cues and education on what to feel when performing specific exercises. Used cone for SLR to help with form as patient continues to be groin dominant with this exercise. Improved glute activation with hip extension with knee bent. Improved painfree SLR after hip extension. Educated patient in use performing these exercises in this sequence. Will continue with current POC as tolerated.   Eval: Patient presents with primary complaint of  pain in L hip. She has significant weakness with testing today for anterior hip >lateral  hip, which is likely contributing to her pain with activity. She will benefit from education on HEP and strengthening for hip, to improve mechanics and pain with standing and functional activities.Pt with decreased ability and tolerance for functional activities, IADLS, stairs, and walking. Pt will  benefit from skilled PT to improve deficits and pain and to return to PLOF.   OBJECTIVE IMPAIRMENTS: Abnormal gait, decreased activity tolerance, decreased mobility, decreased ROM, decreased strength, increased muscle spasms, improper body mechanics, and pain.   ACTIVITY LIMITATIONS: lifting, bending, standing, squatting, stairs, transfers, hygiene/grooming, and locomotion level  PARTICIPATION LIMITATIONS: cleaning, driving, and community activity  PERSONAL FACTORS: Past/current experiences and Time since onset of injury/illness/exacerbation are also affecting patient's functional outcome.   REHAB POTENTIAL: Good  CLINICAL DECISION MAKING: Stable/uncomplicated  EVALUATION COMPLEXITY: Low   GOALS: Goals reviewed with patient? Yes  SHORT TERM GOALS: Target date: 11/27/2023  Pt to be independent with initial HEP  Goal status: INITIAL  2.  Pt to demo ability for SLR with optimal mechanics (strength to at least 3/5)   Goal status: INITIAL   LONG TERM GOALS: Target date: 01/08/2024  Pt to be independent with final HEP  Goal status: INITIAL  2.  Pt to demo improved strength of L hip to be at least 4/5 to improve stability, stairs, and gait   Goal status: INITIAL  3.  Pt to report decreased pain in L hip to 0-2/10 with activity.   Goal status: INITIAL  4.  Pt to improve ability for stairs climbing without pain or deficit, with 1 UE support.   Goal status: INITIAL    PLAN:  PT FREQUENCY: 1-2x/week  PT DURATION: 8 weeks  PLANNED INTERVENTIONS: Therapeutic exercises, Therapeutic activity, Neuromuscular re-education, Patient/Family education, Self Care, Joint mobilization, Joint  manipulation, Stair training, Orthotic/Fit training, DME instructions, Aquatic Therapy, Dry Needling, Electrical stimulation, Cryotherapy, Moist heat, Taping, Ultrasound, Ionotophoresis 4mg /ml Dexamethasone, Manual therapy,  Vasopneumatic device, Traction, Spinal manipulation, Spinal mobilization,Balance training, Gait training,   PLAN FOR NEXT SESSION: f/u with percussion gun as needed for pain in between sets/exercises, pt inquired about bike - f/u next session. Continue with LE strengthening.    12:40 PM, 11/19/23 Tereasa Coop, DPT Physical Therapy with [facility]

## 2023-11-25 ENCOUNTER — Other Ambulatory Visit (INDEPENDENT_AMBULATORY_CARE_PROVIDER_SITE_OTHER): Payer: Medicare Other

## 2023-11-25 ENCOUNTER — Encounter: Payer: Self-pay | Admitting: Obstetrics and Gynecology

## 2023-11-25 ENCOUNTER — Ambulatory Visit: Payer: Medicare Other | Admitting: Physical Therapy

## 2023-11-25 ENCOUNTER — Encounter: Payer: Self-pay | Admitting: Physical Therapy

## 2023-11-25 ENCOUNTER — Other Ambulatory Visit: Payer: Self-pay

## 2023-11-25 DIAGNOSIS — R293 Abnormal posture: Secondary | ICD-10-CM

## 2023-11-25 DIAGNOSIS — M62838 Other muscle spasm: Secondary | ICD-10-CM | POA: Diagnosis not present

## 2023-11-25 DIAGNOSIS — M6281 Muscle weakness (generalized): Secondary | ICD-10-CM

## 2023-11-25 DIAGNOSIS — M25552 Pain in left hip: Secondary | ICD-10-CM

## 2023-11-25 DIAGNOSIS — E559 Vitamin D deficiency, unspecified: Secondary | ICD-10-CM

## 2023-11-25 DIAGNOSIS — R279 Unspecified lack of coordination: Secondary | ICD-10-CM | POA: Diagnosis not present

## 2023-11-25 LAB — VITAMIN D 25 HYDROXY (VIT D DEFICIENCY, FRACTURES): VITD: 88.32 ng/mL (ref 30.00–100.00)

## 2023-11-25 NOTE — Therapy (Cosign Needed)
OUTPATIENT PHYSICAL THERAPY LOWER EXTREMITY Treatment   Patient Name: Mackenzie Green MRN: [id number] DOB:March 12, 1946, 77 y.o., female Today's Date: 11/25/2023  END OF SESSION:  PT End of Session - 11/25/23 0911     Visit Number 3    Number of Visits 16    Date for PT Re-Evaluation 01/08/24    Authorization Type [employer] medicare    PT Start Time 0850    PT Stop Time 0930    PT Time Calculation (min) 40 min    Activity Tolerance Patient tolerated treatment well    Behavior During Therapy [facility] for tasks assessed/performed              Past Medical History:  Diagnosis Date   Hypoglycemia 07/30/2012   Hypokalemia 07/31/2012   Hyponatremia 07/30/2012   Low iron    Mild cognitive impairment with memory loss    Mitral valve prolapse    Neutropenia (HCC) 08/27/2016   Past Surgical History:  Procedure Laterality Date   TEAR DUCT PROBING     TOTAL HIP ARTHROPLASTY Left 03/27/2022   Procedure: LEFT POSTERIOR TOTAL HIP ARTHROPLASTY;  Surgeon: Joen Laura, MD;  Location: [facility] OR;  Service: Orthopedics;  Laterality: Left;   Patient Active Problem List   Diagnosis Date Noted   Age related osteoporosis 08/22/2023   Irritable bowel syndrome with diarrhea 08/22/2023   Hip fracture (HCC) 03/26/2022   Mild cognitive impairment with memory loss 03/26/2022   Spinal stenosis at L4-L5 level 03/26/2022    PCP: Dulce Sellar  REFERRING PROVIDER: Dulce Sellar  REFERRING DIAG: L hip pain  THERAPY DIAG:  Pain in left hip  Muscle weakness (generalized)  Abnormal posture  Other muscle spasm  Unspecified lack of coordination  Rationale for Evaluation and Treatment: Rehabilitation  ONSET DATE: April 2023   SUBJECTIVE:   SUBJECTIVE STATEMENT: 11/25/2023 Pt states she has done her exercises everyday and has noticed the SLR and LAQ are getting easier to complete.   Eval: Pt had fall in April 2023, with hip fracture, and subsequent THA. She states good recovery  after surgery, but did start having some bursitis type pain in the fall. She was having PT for this as well as pelvic floor. She stopped PT in September, and notes increased pain in the last couple months. She has stayed active walking and doing exercises, some gym machines and some classes - chair yoga.  She has newer diagnosis of Oseoporosis, currently not taking medication for treatment for this. She has 1 step to enter her home.  States pain in  hip, feels tight, stiff, knotted.    PERTINENT HISTORY: L THA  April 2023   PAIN:  Are you having pain? Yes: NPRS scale: 0 /10 Pain location: L hip Pain description: tight, stiff, knot Aggravating factors: walking, increased activity  Relieving factors: none stated   PRECAUTIONS: None  WEIGHT BEARING RESTRICTIONS: No  FALLS:  Has patient fallen in last 6 months? No  PLOF: Independent  PATIENT GOALS:  Decreased pain   NEXT MD VISIT:   OBJECTIVE:   DIAGNOSTIC FINDINGS:   PATIENT SURVEYS:    COGNITION: Overall cognitive status: Within functional limits for tasks assessed     SENSATION: WFL  EDEMA:   POSTURE:      PALPATION:  Most Tenderness in L gr troch, and mild soreness in glute/rotators    LOWER EXTREMITY ROM:  Lumbar: WFL Hips: R: WFL,  L: mild limitation for ER, IR Knees: WFL  LOWER EXTREMITY MMT:  MMT Left eval Right  eval  Hip flexion 3 (noted IR position) 4+  Hip extension    Hip abduction 4-   Hip adduction    Hip internal rotation    Hip external rotation    Knee flexion 4 5  Knee extension 4- 4+  Ankle dorsiflexion    Ankle plantarflexion    Ankle inversion    Ankle eversion     (Blank rows = not tested)  LOWER EXTREMITY SPECIAL TESTS:     GAIT:    TODAY'S TREATMENT:                                                                                                                              DATE:   11/25/2023  Therapeutic Exercise: Aerobic: Supine: SLR 4x5; Bridges 2x10 S/L:  Clam shells 2x10 L; Seated: LAQ 15x2" Prone: Hip ext x15 Standing: Stretches:  Neuromuscular Re-education: Manual Therapy: Therapeutic Activity: Self Care: Pt education on appropriate gym equipment to utilize at  this point in POC.       Previous Therapeutic Exercise:  S/l: clamshells x10 L Supine: ASLR L - favors groin - PT assist after PROM of left hip - tolerated better 4x5 B, bridges - difficult to perform with proper muscle activation - cues for pelvic tilt - did not help - stopped exercise Prone: long leg hip extension - mostly hamstring activation - knee bent - tolerated better - tactile and verbal cues. 6x3 B  Seated: LAQs x10 L   Standing: Neuromuscular Re-education: Manual Therapy:percussion to left hip - tolerated well - used to reduce pain between  Therapeutic Activity: Self Care: Trigger Point Dry Needling:  Modalities:    PATIENT EDUCATION:  Education details: HEP Person educated: Patient Education method: Programmer, multimedia, Demonstration, Actor cues, Verbal cues, and Handouts Education comprehension: verbalized understanding, returned demonstration, verbal cues required, tactile cues required, and needs further education   HOME EXERCISE PROGRAM: Access Code: [email or web address] URL: [email or web address] Date: 11/13/2023 Prepared by: Sedalia Muta  Exercises - Clamshell  - 1 x daily - 1-2 sets - 10 reps - Straight Leg Raise  - 1 x daily - 2 sets - 5 reps - Seated Knee Extension AROM  - 1 x daily - 1-2 sets - 10 reps  ASSESSMENT:   CLINICAL IMPRESSION: 11/25/2023 Session focused on NMRE to promote appropriate activation of lumbopelvic and LE muscle activation. Pt showed improvement in her ability to perform glute muscular activation by completing bridges and hip ext during the session. Pt continues to have difficulty with proper LE muscular activation, particularly quad, as seen with challenges in reducing compensatory muscle activation and required TC  and VC throughout the session- although she made improvements during the session. Continue to promote glute and quad NMRE and strengthening as tolerated.       Eval: Patient presents with primary complaint of  pain in L hip. She has significant weakness with testing today for anterior hip >lateral hip, which  is likely contributing to her pain with activity. She will benefit from education on HEP and strengthening for hip, to improve mechanics and pain with standing and functional activities.Pt with decreased ability and tolerance for functional activities, IADLS, stairs, and walking. Pt will  benefit from skilled PT to improve deficits and pain and to return to PLOF.   OBJECTIVE IMPAIRMENTS: Abnormal gait, decreased activity tolerance, decreased mobility, decreased ROM, decreased strength, increased muscle spasms, improper body mechanics, and pain.   ACTIVITY LIMITATIONS: lifting, bending, standing, squatting, stairs, transfers, hygiene/grooming, and locomotion level  PARTICIPATION LIMITATIONS: cleaning, driving, and community activity  PERSONAL FACTORS: Past/current experiences and Time since onset of injury/illness/exacerbation are also affecting patient's functional outcome.   REHAB POTENTIAL: Good  CLINICAL DECISION MAKING: Stable/uncomplicated  EVALUATION COMPLEXITY: Low   GOALS: Goals reviewed with patient? Yes  SHORT TERM GOALS: Target date: 11/27/2023  Pt to be independent with initial HEP  Goal status: INITIAL  2.  Pt to demo ability for SLR with optimal mechanics (strength to at least 3/5)   Goal status: INITIAL   LONG TERM GOALS: Target date: 01/08/2024  Pt to be independent with final HEP  Goal status: INITIAL  2.  Pt to demo improved strength of L hip to be at least 4/5 to improve stability, stairs, and gait   Goal status: INITIAL  3.  Pt to report decreased pain in L hip to 0-2/10 with activity.   Goal status: INITIAL  4.  Pt to improve ability for stairs  climbing without pain or deficit, with 1 UE support.   Goal status: INITIAL    PLAN:  PT FREQUENCY: 1-2x/week  PT DURATION: 8 weeks  PLANNED INTERVENTIONS: Therapeutic exercises, Therapeutic activity, Neuromuscular re-education, Patient/Family education, Self Care, Joint mobilization, Joint manipulation, Stair training, Orthotic/Fit training, DME instructions, Aquatic Therapy, Dry Needling, Electrical stimulation, Cryotherapy, Moist heat, Taping, Ultrasound, Ionotophoresis 4mg /ml Dexamethasone, Manual therapy,  Vasopneumatic device, Traction, Spinal manipulation, Spinal mobilization,Balance training, Gait training,   PLAN FOR NEXT SESSION: f/u with percussion gun as needed for pain in between sets/exercises, pt inquired about bike - f/u next session. Continue with LE strengthening.   [facility], SPT   This entire session was performed under direct supervision and direction of a licensed Estate agent . I have personally read, edited and approve of the note as written.   10:00 AM, 11/25/23

## 2023-11-25 NOTE — Telephone Encounter (Signed)
Vitamin D orders placed for future.

## 2023-11-26 ENCOUNTER — Encounter: Payer: Self-pay | Admitting: Family

## 2023-11-26 ENCOUNTER — Ambulatory Visit: Payer: Medicare Other | Admitting: Family

## 2023-11-26 VITALS — BP 110/80 | HR 78 | Temp 97.8°F | Ht 65.0 in | Wt 109.8 lb

## 2023-11-26 DIAGNOSIS — M810 Age-related osteoporosis without current pathological fracture: Secondary | ICD-10-CM

## 2023-11-26 DIAGNOSIS — K580 Irritable bowel syndrome with diarrhea: Secondary | ICD-10-CM | POA: Diagnosis not present

## 2023-11-26 NOTE — Assessment & Plan Note (Signed)
chronic, stable Patient has been self-adjusting the dose of her Cholestyramine to 1 packet qd with some improvement in symptoms. She has an upcoming appointment with [facility] to discuss further management. She will discuss starting Lotronex, after PA, her copay is $30. -Continue current medication regimen.   -Consult with gastroenterologist regarding potential changes to medication regimen or addition of new medication (Lotronex).

## 2023-11-26 NOTE — Assessment & Plan Note (Signed)
chronic, stable Patient is due for a DEXA scan in August. Previous scan was done at [facility], but patient has concerns about the accuracy of her results and would prefer to have the next scan done at the [facility].  Reviewed Vit D results. -[facility].   -Continue taking OTC Vitamin D3 3600units w/1200mg  Calcium qd.

## 2023-11-26 NOTE — Progress Notes (Signed)
Patient ID: Mackenzie Green, female    DOB: 1946/01/13, 77 y.o.   MRN: [id number]  Chief Complaint  Patient presents with   Follow-up    Follow up about vitamin d    History of Present Illness   The patient, with a history of mitral valve prolapse and gastrointestinal issues, presents with concerns about her vitamin D dosage, scheduling a DEXA scan, premedication for dental procedures, and managing symptoms of diarrhea, gas, and bloating. The patient has been taking vitamin D supplements and has been monitoring her dosage carefully. She has also been taking a medication for her gastrointestinal symptoms, which has been somewhat effective in reducing diarrhea and gas, but not completely eliminating these symptoms. The patient has an upcoming appointment with a gastroenterologist to further discuss her gastrointestinal issues. The patient also has concerns about scheduling a DEXA scan due to a previous experience with inaccurate results from a different imaging center. She is also considering changing her medical plan due to concerns about copay costs.     Assessment & Plan:     Osteoporosis  - Recent Vitamin D level 55. Patient is due for a DEXA scan in August. Previous scan was done at [facility], but patient has concerns about the accuracy of her results and would prefer to have the next scan done at the [facility].   -[facility].   -Continue current Vitamin D 3600units with Calcium 1200mg  daily. -Check all supplements for additional Vitamin D content and notify the office.  Mitral Valve Prolapse  - Patient's dentist inquired about the need for premedication before dental cleanings.   -Advised patient that premedication is not necessary for cleanings or invasive procedures.  IBS w/Diarrhea  - Patient has been self-adjusting the dose of her Cholestyramine to 1 packet qd with some improvement in symptoms. She has an upcoming appointment with [facility] to discuss further management. She will discuss starting Lotronex, after PA, her copay is $30. -Continue current medication regimen.   -Consult with gastroenterologist regarding potential changes to medication regimen or addition of new medication (Lotronex).     Subjective:    Outpatient Medications Prior to Visit  Medication Sig Dispense Refill   CALCIUM CITRATE PO Take by mouth.     Cholecalciferol (VITAMIN D) 50 MCG (2000 UT) CAPS Take 1,000 Units by mouth daily.     cholestyramine (QUESTRAN) 4 g packet Take 4 g by mouth daily.     estradiol (ESTRACE) 0.1 MG/GM vaginal cream Place 1 Applicatorful vaginally 2 (two) times a week.     Ferrous Sulfate (IRON) 325 (65 Fe) MG TABS 1 tablet Orally Once a day     Lubricants (K-Y JELLY EX) Apply topically.     MAGNESIUM CITRATE PO Take 250 mg by mouth 2 (two) times daily.     Menaquinone-7 (VITAMIN K2) 100 MCG CAPS      Multiple Vitamin (MULTIVITAMIN ADULT) TABS Take 1 tablet by mouth daily.     peppermint oil liquid by Does not apply route as needed.     Probiotic Product (PROBIOTIC PO) Take by mouth.     vitamin C (ASCORBIC ACID) 500 MG tablet Take 500 mg by mouth daily.     vitamin k 100 MCG tablet      alosetron (LOTRONEX) 0.5 MG tablet Take 1 tablet (0.5 mg total) by mouth 2 (two) times daily. 60 tablet 0   Clobetasol Prop Emollient Base (CLOBETASOL PROPIONATE E) 0.05 %  emollient cream Apply to vulva and around rectum nightly for two weeks then twice a week after. (Patient not taking: Reported on 11/26/2023) 30 g 11   estradiol (ESTRING) 7.5 MCG/24HR vaginal ring Place 1 each vaginally every 3 (three) months. (Patient not taking: Reported on 11/26/2023) 1 each 3   nitrofurantoin, macrocrystal-monohydrate, (MACROBID) 100 MG capsule Take 1 capsule (100 mg total) by mouth 2 (two) times daily. 14 capsule 0   No facility-administered medications prior to visit.   Past Medical History:  Diagnosis Date   Hypoglycemia 07/30/2012    Hypokalemia 07/31/2012   Hyponatremia 07/30/2012   Low iron    Mild cognitive impairment with memory loss    Mitral valve prolapse    Neutropenia (HCC) 08/27/2016   Past Surgical History:  Procedure Laterality Date   TEAR DUCT PROBING     TOTAL HIP ARTHROPLASTY Left 03/27/2022   Procedure: LEFT POSTERIOR TOTAL HIP ARTHROPLASTY;  Surgeon: Joen Laura, MD;  Location: [facility] OR;  Service: Orthopedics;  Laterality: Left;   Allergies  Allergen Reactions   Lactose Intolerance (Gi)     Milk    Macrobid [Nitrofurantoin] Rash      Objective:    Physical Exam Vitals and nursing note reviewed.  Constitutional:      Appearance: Normal appearance.  Cardiovascular:     Rate and Rhythm: Normal rate and regular rhythm.  Pulmonary:     Effort: Pulmonary effort is normal.     Breath sounds: Normal breath sounds.  Musculoskeletal:        General: Normal range of motion.  Skin:    General: Skin is warm and dry.  Neurological:     Mental Status: She is alert.  Psychiatric:        Mood and Affect: Mood normal.        Behavior: Behavior normal.    BP 110/80   Pulse 78   Temp 97.8 F (36.6 C) (Temporal)   Ht 5\' 5"  (1.651 m)   Wt 109 lb 12.8 oz (49.8 kg)   BMI 18.27 kg/m  Wt Readings from Last 3 Encounters:  11/26/23 109 lb 12.8 oz (49.8 kg)  11/10/23 108 lb 3.2 oz (49.1 kg)  10/14/23 106 lb 6.4 oz (48.3 kg)   *Extra time ( ) spent with patient today which consisted of chart review, discussing diagnoses, work up, treatment, answering questions, and documentation.     Dulce Sellar, NP

## 2023-11-27 ENCOUNTER — Encounter: Payer: Self-pay | Admitting: Physical Therapy

## 2023-11-27 ENCOUNTER — Ambulatory Visit: Payer: Medicare Other | Admitting: Physical Therapy

## 2023-11-27 DIAGNOSIS — M6281 Muscle weakness (generalized): Secondary | ICD-10-CM

## 2023-11-27 DIAGNOSIS — M25552 Pain in left hip: Secondary | ICD-10-CM

## 2023-11-27 NOTE — Therapy (Signed)
OUTPATIENT PHYSICAL THERAPY LOWER EXTREMITY Treatment   Patient Name: Mackenzie Green MRN: [id number] DOB:[birth year], 77 y.o., female Today's Date: 11/27/2023  END OF SESSION:  PT End of Session - 11/27/23 1116     Visit Number 4    Number of Visits 16    Date for PT Re-Evaluation 01/08/24    Authorization Type [employer] medicare    PT Start Time 1020    PT Stop Time 1104    PT Time Calculation (min) 44 min    Activity Tolerance Patient tolerated treatment well    Behavior During Therapy [facility] for tasks assessed/performed               Past Medical History:  Diagnosis Date   Hypoglycemia 07/30/2012   Hypokalemia 07/31/2012   Hyponatremia 07/30/2012   Low iron    Mild cognitive impairment with memory loss    Mitral valve prolapse    Neutropenia (HCC) 08/27/2016   Past Surgical History:  Procedure Laterality Date   TEAR DUCT PROBING     TOTAL HIP ARTHROPLASTY Left 03/27/2022   Procedure: LEFT POSTERIOR TOTAL HIP ARTHROPLASTY;  Surgeon: Joen Laura, MD;  Location: [facility] OR;  Service: Orthopedics;  Laterality: Left;   Patient Active Problem List   Diagnosis Date Noted   Age related osteoporosis 08/22/2023   Irritable bowel syndrome with diarrhea 08/22/2023   Hip fracture (HCC) 03/26/2022   Mild cognitive impairment with memory loss 03/26/2022   Spinal stenosis at L4-L5 level 03/26/2022    PCP: Dulce Sellar  REFERRING PROVIDER: Dulce Sellar  REFERRING DIAG: L hip pain  THERAPY DIAG:  Pain in left hip  Muscle weakness (generalized)  Rationale for Evaluation and Treatment: Rehabilitation  ONSET DATE: April 2023   SUBJECTIVE:   SUBJECTIVE STATEMENT: 11/27/2023 Pt states continued soreness in hip "on the bone"   Eval: Pt had fall in April 2023, with hip fracture, and subsequent THA. She states good recovery after surgery, but did start having some bursitis type pain in the fall. She was having PT for this as well as pelvic floor. She stopped PT  in September, and notes increased pain in the last couple months. She has stayed active walking and doing exercises, some gym machines and some classes - chair yoga.  She has newer diagnosis of Oseoporosis, currently not taking medication for treatment for this. She has 1 step to enter her home.  States pain in  hip, feels tight, stiff, knotted.    PERTINENT HISTORY: L THA  April 2023   PAIN:  Are you having pain? Yes: NPRS scale: 0 /10 Pain location: L hip Pain description: tight, stiff, knot Aggravating factors: walking, increased activity  Relieving factors: none stated   PRECAUTIONS: None  WEIGHT BEARING RESTRICTIONS: No  FALLS:  Has patient fallen in last 6 months? No  PLOF: Independent  PATIENT GOALS:  Decreased pain   NEXT MD VISIT:   OBJECTIVE:   DIAGNOSTIC FINDINGS:   PATIENT SURVEYS:    COGNITION: Overall cognitive status: Within functional limits for tasks assessed     SENSATION: WFL  EDEMA:   POSTURE:      PALPATION:  Most Tenderness in L gr troch, and mild soreness in glute/rotators    LOWER EXTREMITY ROM:  Lumbar: WFL Hips: R: WFL,  L: mild limitation for ER, IR Knees: WFL  LOWER EXTREMITY MMT:  MMT Left eval Right  eval  Hip flexion 3 (noted IR position) 4+  Hip extension    Hip abduction  4-   Hip adduction    Hip internal rotation    Hip external rotation    Knee flexion 4 5  Knee extension 4- 4+  Ankle dorsiflexion    Ankle plantarflexion    Ankle inversion    Ankle eversion     (Blank rows = not tested)  LOWER EXTREMITY SPECIAL TESTS:    GAIT:   TODAY'S TREATMENT:                                                                                                                              DATE:   11/27/2023 Therapeutic Exercise: Aerobic:  Bike L1 x 6 min- education on optimal leg position;  Supine:   Bridges with ball squeeze  2x10  S/L: Clam shells 2x10 L;  hip abd x 15 on L;  trial for hip adduction in s/l- too  painful to lay on L. Seated: LAQ 2lb, 2 x 10;  sit to stand/higher mat table with cueing for leg position;  Prone:  Standing: Stretches:  Neuromuscular Re-education: Manual Therapy: Therapeutic Activity: Self Care:     Therapeutic Exercise: Aerobic: Supine: SLR 4x5; Bridges 2x10 S/L: Clam shells 2x10 L; Seated: LAQ 15x2" Prone: Hip ext x15 Standing: Stretches:  Neuromuscular Re-education: Manual Therapy: Therapeutic Activity: Self Care: Pt education on appropriate gym equipment to utilize at  this point in POC.       Previous Therapeutic Exercise:  S/l: clamshells x10 L Supine: ASLR L - favors groin - PT assist after PROM of left hip - tolerated better 4x5 B, bridges - difficult to perform with proper muscle activation - cues for pelvic tilt - did not help - stopped exercise Prone: long leg hip extension - mostly hamstring activation - knee bent - tolerated better - tactile and verbal cues. 6x3 B  Seated: LAQs x10 L   Standing: Neuromuscular Re-education: Manual Therapy:percussion to left hip - tolerated well - used to reduce pain between  Therapeutic Activity: Self Care: Trigger Point Dry Needling:  Modalities:    PATIENT EDUCATION:  Education details: HEP Person educated: Patient Education method: Programmer, multimedia, Demonstration, Actor cues, Verbal cues, and Handouts Education comprehension: verbalized understanding, returned demonstration, verbal cues required, tactile cues required, and needs further education   HOME EXERCISE PROGRAM: Access Code: [email or web address] URL: [email or web address] Date: 11/13/2023 Prepared by: Sedalia Muta  Exercises - Clamshell  - 1 x daily - 1-2 sets - 10 reps - Straight Leg Raise  - 1 x daily - 2 sets - 5 reps - Seated Knee Extension AROM  - 1 x daily - 1-2 sets - 10 reps  ASSESSMENT:   CLINICAL IMPRESSION: 11/27/2023 Pt with improved ability for LAQ today, with improved thigh position and ability for 2lb weight  without compensation. Pt continues to benefit from strengthening of L hip and thigh due to significant weakness. Trial for laying on L side today, but too painful. Pt to benefit from continued strength as tolerated.  Eval: Patient presents with primary complaint of  pain in L hip. She has significant weakness with testing today for anterior hip >lateral hip, which is likely contributing to her pain with activity. She will benefit from education on HEP and strengthening for hip, to improve mechanics and pain with standing and functional activities.Pt with decreased ability and tolerance for functional activities, IADLS, stairs, and walking. Pt will  benefit from skilled PT to improve deficits and pain and to return to PLOF.   OBJECTIVE IMPAIRMENTS: Abnormal gait, decreased activity tolerance, decreased mobility, decreased ROM, decreased strength, increased muscle spasms, improper body mechanics, and pain.   ACTIVITY LIMITATIONS: lifting, bending, standing, squatting, stairs, transfers, hygiene/grooming, and locomotion level  PARTICIPATION LIMITATIONS: cleaning, driving, and community activity  PERSONAL FACTORS: Past/current experiences and Time since onset of injury/illness/exacerbation are also affecting patient's functional outcome.   REHAB POTENTIAL: Good  CLINICAL DECISION MAKING: Stable/uncomplicated  EVALUATION COMPLEXITY: Low   GOALS: Goals reviewed with patient? Yes  SHORT TERM GOALS: Target date: 11/27/2023  Pt to be independent with initial HEP  Goal status: INITIAL  2.  Pt to demo ability for SLR with optimal mechanics (strength to at least 3/5)   Goal status: INITIAL   LONG TERM GOALS: Target date: 01/08/2024  Pt to be independent with final HEP  Goal status: INITIAL  2.  Pt to demo improved strength of L hip to be at least 4/5 to improve stability, stairs, and gait   Goal status: INITIAL  3.  Pt to report decreased pain in L hip to 0-2/10 with activity.    Goal status: INITIAL  4.  Pt to improve ability for stairs climbing without pain or deficit, with 1 UE support.   Goal status: INITIAL    PLAN:  PT FREQUENCY: 1-2x/week  PT DURATION: 8 weeks  PLANNED INTERVENTIONS: Therapeutic exercises, Therapeutic activity, Neuromuscular re-education, Patient/Family education, Self Care, Joint mobilization, Joint manipulation, Stair training, Orthotic/Fit training, DME instructions, Aquatic Therapy, Dry Needling, Electrical stimulation, Cryotherapy, Moist heat, Taping, Ultrasound, Ionotophoresis 4mg /ml Dexamethasone, Manual therapy,  Vasopneumatic device, Traction, Spinal manipulation, Spinal mobilization,Balance training, Gait training,   PLAN FOR NEXT SESSION: f/u with percussion gun as needed for pain in between sets/exercises, pt inquired about bike - f/u next session. Continue with LE strengthening.     Sedalia Muta, PT, DPT 11:18 AM  11/27/23

## 2023-12-01 DIAGNOSIS — R109 Unspecified abdominal pain: Secondary | ICD-10-CM | POA: Diagnosis not present

## 2023-12-01 DIAGNOSIS — R140 Abdominal distension (gaseous): Secondary | ICD-10-CM | POA: Diagnosis not present

## 2023-12-01 DIAGNOSIS — R197 Diarrhea, unspecified: Secondary | ICD-10-CM | POA: Diagnosis not present

## 2023-12-02 ENCOUNTER — Ambulatory Visit: Payer: Medicare Other | Admitting: Obstetrics and Gynecology

## 2023-12-02 ENCOUNTER — Encounter: Payer: Self-pay | Admitting: Obstetrics and Gynecology

## 2023-12-02 ENCOUNTER — Other Ambulatory Visit (HOSPITAL_COMMUNITY)
Admission: RE | Admit: 2023-12-02 | Discharge: 2023-12-02 | Disposition: A | Discharge status: Home / Self Care | Payer: Medicare Other | Source: Ambulatory Visit | Attending: Obstetrics and Gynecology | Admitting: Obstetrics and Gynecology

## 2023-12-02 VITALS — BP 110/71 | HR 77

## 2023-12-02 DIAGNOSIS — N898 Other specified noninflammatory disorders of vagina: Secondary | ICD-10-CM | POA: Diagnosis present

## 2023-12-02 DIAGNOSIS — R152 Fecal urgency: Secondary | ICD-10-CM | POA: Diagnosis not present

## 2023-12-02 DIAGNOSIS — R159 Full incontinence of feces: Secondary | ICD-10-CM | POA: Diagnosis not present

## 2023-12-02 DIAGNOSIS — N812 Incomplete uterovaginal prolapse: Secondary | ICD-10-CM

## 2023-12-02 DIAGNOSIS — N8110 Cystocele, unspecified: Secondary | ICD-10-CM

## 2023-12-02 DIAGNOSIS — R350 Frequency of micturition: Secondary | ICD-10-CM | POA: Diagnosis not present

## 2023-12-02 NOTE — Progress Notes (Signed)
[facility] Return Visit  SUBJECTIVE  History of Present Illness: Mackenzie Green is a 77 y.o. female seen in follow-up for vaginal discharge concerns with pessary use.   Patient reports that she has tried using multiple different lubrication's and gels in the vagina when doing the pessary and she has continued to have a greenish discharge that she is concerned about.  She denies bleeding or other discharge coloration, she denies itching, and she denies other concerning vaginal symptoms.  Patient recently went to establish care with a different GI specialist related to her fecal incontinence and she reports it was a very difficult visit.  They are wanting her to undergo multiple different tests related to her diarrhea.  Patient reports the diarrhea is not her largest concern, but rather her inability to make it to the bathroom without fecal leakage.   Past Medical History: Patient  has a past medical history of Hypoglycemia (07/30/2012), Hypokalemia (07/31/2012), Hyponatremia (07/30/2012), Low iron, Mild cognitive impairment with memory loss, Mitral valve prolapse, and Neutropenia (HCC) (08/27/2016).   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   Medications: She has a current medication list which includes the following prescription(s): calcium citrate, vitamin d, cholestyramine, estradiol, iron, lubricants, magnesium citrate, vitamin k2, multivitamin adult, peppermint oil, probiotic product, ascorbic acid, and vitamin k.   Allergies: Patient is allergic to lactose intolerance (gi) and macrobid [nitrofurantoin].   Social History: Patient  reports that she has never smoked. She has never used smokeless tobacco. She reports current alcohol use. She reports that she does not use drugs.      OBJECTIVE   .last  Physical Exam: Vitals:   12/02/23 1125  BP: 110/71  Pulse: 77   Gen:  No apparent distress, A&O x 3.  Detailed Urogynecologic Evaluation:  Patient's vaginal exam did not show any vaginal bleeding or concerning areas of note.  Patient did have a mild green tinted vaginal discharge.  No obvious signs of yeast.  Aptima swab obtained.   ASSESSMENT AND PLAN    [name] is a 77 y.o. with:  1. Vaginal discharge   2. Incontinence of feces with fecal urgency   3. Prolapse of anterior vaginal wall   4. Uterovaginal prolapse, incomplete    Aptima swab obtained today of vaginal discharge to rule out BV, yeast, glabrata.  I feel that this is potentially bacterial vaginosis based on her description.  Although there is no obvious odor.  We also discussed that it could be normal vaginal flora that has over populated a bit.  Patient was given boric acid suppositories which she can do in the evenings to help reset her vaginal flora.  Patient also plans to put estrogen cream on the back of the pessary before she inserts. Patient is unsure if she wants to go forward with doing more testing related to her diarrhea.  We briefly discussed that Dr. Florian Buff is considering looking into doing rectal bulking as a possible assistance to people that have fecal incontinence.  Patient was very interested in learning more about this if we choose to go forward with offering this in the office.  She also would like me to inquire if Dr. Florian Buff about the testing that GI wanted her to complete.  Patient has completed a year of pelvic floor PT and has been very diligent in advocating for her care and doing pelvic floor exercises.  Patient became tearful  while talking about her journey through fecal incontinence and feeling unheard by other offices. For patient's prolapse she has been well-managed with the cube pessary which she is able to remove and place herself.  Patient's main concern is the small loop attached to the pessary hanging out of the vagina slightly and getting into the fecal leakage.   We moved the loop today to the side of the pessary so that hopefully it will not protrude out as much.  Patient to continue to use her pessary.  Plan for patient to follow-up in 2 to 3 months to evaluate how she is doing with her pessary.  Will inform patient of results of Aptima swab when results come in.    Selmer Dominion, NP

## 2023-12-03 ENCOUNTER — Encounter: Payer: Self-pay | Admitting: Physical Therapy

## 2023-12-03 ENCOUNTER — Ambulatory Visit: Payer: Medicare Other | Admitting: Physical Therapy

## 2023-12-03 DIAGNOSIS — M6281 Muscle weakness (generalized): Secondary | ICD-10-CM | POA: Diagnosis not present

## 2023-12-03 DIAGNOSIS — R279 Unspecified lack of coordination: Secondary | ICD-10-CM | POA: Diagnosis not present

## 2023-12-03 DIAGNOSIS — R293 Abnormal posture: Secondary | ICD-10-CM

## 2023-12-03 DIAGNOSIS — M25552 Pain in left hip: Secondary | ICD-10-CM

## 2023-12-03 DIAGNOSIS — M62838 Other muscle spasm: Secondary | ICD-10-CM

## 2023-12-03 LAB — CERVICOVAGINAL ANCILLARY ONLY
Bacterial Vaginitis (gardnerella): POSITIVE — AB
Candida Glabrata: NEGATIVE
Candida Vaginitis: NEGATIVE
Comment: NEGATIVE
Comment: NEGATIVE
Comment: NEGATIVE

## 2023-12-03 NOTE — Therapy (Signed)
OUTPATIENT PHYSICAL THERAPY LOWER EXTREMITY Treatment   Patient Name: Mackenzie Green MRN: [id number] DOB:1945-12-26, 77 y.o., female Today's Date: 12/03/2023  END OF SESSION:  PT End of Session - 12/03/23 0844     Visit Number 5    Number of Visits 16    Date for PT Re-Evaluation 01/08/24    Authorization Type [employer] medicare    PT Start Time 0847    PT Stop Time 0928    PT Time Calculation (min) 41 min    Activity Tolerance Patient tolerated treatment well;No increased pain    Behavior During Therapy [facility] for tasks assessed/performed                Past Medical History:  Diagnosis Date   Hypoglycemia 07/30/2012   Hypokalemia 07/31/2012   Hyponatremia 07/30/2012   Low iron    Mild cognitive impairment with memory loss    Mitral valve prolapse    Neutropenia (HCC) 08/27/2016   Past Surgical History:  Procedure Laterality Date   TEAR DUCT PROBING     TOTAL HIP ARTHROPLASTY Left 03/27/2022   Procedure: LEFT POSTERIOR TOTAL HIP ARTHROPLASTY;  Surgeon: Joen Laura, MD;  Location: [facility] OR;  Service: Orthopedics;  Laterality: Left;   Patient Active Problem List   Diagnosis Date Noted   Age related osteoporosis 08/22/2023   Irritable bowel syndrome with diarrhea 08/22/2023   Hip fracture (HCC) 03/26/2022   Mild cognitive impairment with memory loss 03/26/2022   Spinal stenosis at L4-L5 level 03/26/2022    PCP: Dulce Sellar  REFERRING PROVIDER: Dulce Sellar  REFERRING DIAG: L hip pain  THERAPY DIAG:  Pain in left hip  Unspecified lack of coordination  Other muscle spasm  Muscle weakness (generalized)  Abnormal posture  Rationale for Evaluation and Treatment: Rehabilitation  ONSET DATE: April 2023   SUBJECTIVE:   SUBJECTIVE STATEMENT:  12/03/2023 Pt states her L hip is feeling stronger but it hurts when she drives and "feels hard".   Eval: Pt had fall in April 2023, with hip fracture, and subsequent THA. She states good recovery  after surgery, but did start having some bursitis type pain in the fall. She was having PT for this as well as pelvic floor. She stopped PT in September, and notes increased pain in the last couple months. She has stayed active walking and doing exercises, some gym machines and some classes - chair yoga.  She has newer diagnosis of Oseoporosis, currently not taking medication for treatment for this. She has 1 step to enter her home.  States pain in  hip, feels tight, stiff, knotted.    PERTINENT HISTORY: L THA  April 2023   PAIN:  Are you having pain? Yes: NPRS scale: 0 /10 Pain location: L hip Pain description: tight, stiff, knot Aggravating factors: walking, increased activity  Relieving factors: none stated   PRECAUTIONS: None  WEIGHT BEARING RESTRICTIONS: No  FALLS:  Has patient fallen in last 6 months? No  PLOF: Independent  PATIENT GOALS:  Decreased pain   NEXT MD VISIT:   OBJECTIVE:   DIAGNOSTIC FINDINGS:   PATIENT SURVEYS:    COGNITION: Overall cognitive status: Within functional limits for tasks assessed     SENSATION: WFL  EDEMA:   POSTURE:      PALPATION:  Most Tenderness in L gr troch, and mild soreness in glute/rotators    LOWER EXTREMITY ROM:  Lumbar: WFL Hips: R: WFL,  L: mild limitation for ER, IR Knees: WFL  LOWER EXTREMITY  MMT:  MMT Left eval Right  eval  Hip flexion 3 (noted IR position) 4+  Hip extension    Hip abduction 4-   Hip adduction    Hip internal rotation    Hip external rotation    Knee flexion 4 5  Knee extension 4- 4+  Ankle dorsiflexion    Ankle plantarflexion    Ankle inversion    Ankle eversion     (Blank rows = not tested)  LOWER EXTREMITY SPECIAL TESTS:    GAIT:   TODAY'S TREATMENT:                                                                                                                              DATE:   12/03/2023 Therapeutic Exercise: Aerobic:  Bike L1 x 4 min  Supine:   Bridges with  ball squeeze  2x10;  S/L: Clam shells 2x10 L;   Seated: LAQ 2lb, 2 x 10;  Prone: "; Standing: Stretches:  Neuromuscular Re-education: S/L: hip abd x 15 on L, with pillow under L hip;  Prone: Hamstring curls, 4# L 15x3" Supine: SLR, L x15, with cueing for hip ER and maintaining knee ext Manual Therapy: Therapeutic Activity: Self Care:   Previous  Therapeutic Exercise: Aerobic:  Bike L1 x 6 min- education on optimal leg position;  Supine:   Bridges with ball squeeze  2x10  S/L: Clam shells 2x10 L;  hip abd x 15 on L;  trial for hip adduction in s/l- too painful to lay on L. Seated: LAQ 2lb, 2 x 10;  sit to stand/higher mat table with cueing for leg position;  Prone:  Standing: Stretches:  Neuromuscular Re-education: Manual Therapy: Therapeutic Activity: Self Care:   Therapeutic Exercise: Aerobic: Supine: SLR 4x5; Bridges 2x10 S/L: Clam shells 2x10 L; Seated: LAQ 15x2" Prone: Hip ext x15 Standing: Stretches:  Neuromuscular Re-education: Manual Therapy: Therapeutic Activity: Self Care: Pt education on appropriate gym equipment to utilize at  this point in POC.    PATIENT EDUCATION:  Education details: HEP Person educated: Patient Education method: Solicitor, Actor cues, Verbal cues, and Handouts Education comprehension: verbalized understanding, returned demonstration, verbal cues required, tactile cues required, and needs further education   HOME EXERCISE PROGRAM: Access Code: [email or web address] URL: [email or web address] Date: 11/13/2023 Prepared by: Sedalia Muta  Exercises - Clamshell  - 1 x daily - 1-2 sets - 10 reps - Straight Leg Raise  - 1 x daily - 2 sets - 5 reps - Seated Knee Extension AROM  - 1 x daily - 1-2 sets - 10 reps  ASSESSMENT:   CLINICAL IMPRESSION: 12/03/2023 Session focused on ther ex to promote LE strengthening and NMRE to promote muscle activation required for standing/dynamic tasks. Pt displayed some  improvement in quad and glute control. Pt continues to have difficulty with LE muscular control, particular quad, adductors, and glutes, and hip stiffness secondary to decreased motor control, muscular strength and endurance, and pain in her  L hip -although steady progress is being made. The left sidelying position is difficult for the pt to achieve and requires additional padding secondary to pain. Continue to progress LE motor control and LE strengthening as tolerated.     Eval: Patient presents with primary complaint of  pain in L hip. She has significant weakness with testing today for anterior hip >lateral hip, which is likely contributing to her pain with activity. She will benefit from education on HEP and strengthening for hip, to improve mechanics and pain with standing and functional activities.Pt with decreased ability and tolerance for functional activities, IADLS, stairs, and walking. Pt will  benefit from skilled PT to improve deficits and pain and to return to PLOF.   OBJECTIVE IMPAIRMENTS: Abnormal gait, decreased activity tolerance, decreased mobility, decreased ROM, decreased strength, increased muscle spasms, improper body mechanics, and pain.   ACTIVITY LIMITATIONS: lifting, bending, standing, squatting, stairs, transfers, hygiene/grooming, and locomotion level  PARTICIPATION LIMITATIONS: cleaning, driving, and community activity  PERSONAL FACTORS: Past/current experiences and Time since onset of injury/illness/exacerbation are also affecting patient's functional outcome.   REHAB POTENTIAL: Good  CLINICAL DECISION MAKING: Stable/uncomplicated  EVALUATION COMPLEXITY: Low   GOALS: Goals reviewed with patient? Yes  SHORT TERM GOALS: Target date: 11/27/2023  Pt to be independent with initial HEP  Goal status: INITIAL  2.  Pt to demo ability for SLR with optimal mechanics (strength to at least 3/5)   Goal status: INITIAL   LONG TERM GOALS: Target date: 01/08/2024  Pt  to be independent with final HEP  Goal status: INITIAL  2.  Pt to demo improved strength of L hip to be at least 4/5 to improve stability, stairs, and gait   Goal status: INITIAL  3.  Pt to report decreased pain in L hip to 0-2/10 with activity.   Goal status: INITIAL  4.  Pt to improve ability for stairs climbing without pain or deficit, with 1 UE support.   Goal status: INITIAL    PLAN:  PT FREQUENCY: 1-2x/week  PT DURATION: 8 weeks  PLANNED INTERVENTIONS: Therapeutic exercises, Therapeutic activity, Neuromuscular re-education, Patient/Family education, Self Care, Joint mobilization, Joint manipulation, Stair training, Orthotic/Fit training, DME instructions, Aquatic Therapy, Dry Needling, Electrical stimulation, Cryotherapy, Moist heat, Taping, Ultrasound, Ionotophoresis 4mg /ml Dexamethasone, Manual therapy,  Vasopneumatic device, Traction, Spinal manipulation, Spinal mobilization,Balance training, Gait training,   PLAN FOR NEXT SESSION: f/u with percussion gun as needed for pain in between sets/exercises, pt inquired about bike - f/u next session. Continue with LE strengthening. Review bird dog exercise next session.    [facility], SPT     This entire session was performed under direct supervision and direction of a licensed Estate agent . I have personally read, edited and approve of the note as written.  Sedalia Muta, PT, DPT 10:07 AM  12/03/23

## 2023-12-04 ENCOUNTER — Encounter: Payer: Self-pay | Admitting: Obstetrics and Gynecology

## 2023-12-05 ENCOUNTER — Encounter: Payer: Self-pay | Admitting: Physical Therapy

## 2023-12-05 ENCOUNTER — Ambulatory Visit: Payer: Medicare Other | Admitting: Physical Therapy

## 2023-12-05 DIAGNOSIS — M62838 Other muscle spasm: Secondary | ICD-10-CM

## 2023-12-05 DIAGNOSIS — M6281 Muscle weakness (generalized): Secondary | ICD-10-CM

## 2023-12-05 DIAGNOSIS — R293 Abnormal posture: Secondary | ICD-10-CM

## 2023-12-05 DIAGNOSIS — R279 Unspecified lack of coordination: Secondary | ICD-10-CM | POA: Diagnosis not present

## 2023-12-05 DIAGNOSIS — M25552 Pain in left hip: Secondary | ICD-10-CM | POA: Diagnosis not present

## 2023-12-05 NOTE — Therapy (Signed)
OUTPATIENT PHYSICAL THERAPY LOWER EXTREMITY Treatment   Patient Name: Mackenzie Green MRN: [id number] DOB:01/21/1946, 77 y.o., female Today's Date: 12/05/2023  END OF SESSION:  PT End of Session - 12/05/23 0942     Visit Number 6    Number of Visits 16    Date for PT Re-Evaluation 01/08/24    Authorization Type [employer] medicare    PT Start Time 0935    PT Stop Time 1015    PT Time Calculation (min) 40 min    Activity Tolerance Patient tolerated treatment well;No increased pain    Behavior During Therapy [facility] for tasks assessed/performed                 Past Medical History:  Diagnosis Date   Hypoglycemia 07/30/2012   Hypokalemia 07/31/2012   Hyponatremia 07/30/2012   Low iron    Mild cognitive impairment with memory loss    Mitral valve prolapse    Neutropenia (HCC) 08/27/2016   Past Surgical History:  Procedure Laterality Date   TEAR DUCT PROBING     TOTAL HIP ARTHROPLASTY Left 03/27/2022   Procedure: LEFT POSTERIOR TOTAL HIP ARTHROPLASTY;  Surgeon: Joen Laura, MD;  Location: [facility] OR;  Service: Orthopedics;  Laterality: Left;   Patient Active Problem List   Diagnosis Date Noted   Age related osteoporosis 08/22/2023   Irritable bowel syndrome with diarrhea 08/22/2023   Hip fracture (HCC) 03/26/2022   Mild cognitive impairment with memory loss 03/26/2022   Spinal stenosis at L4-L5 level 03/26/2022    PCP: Dulce Sellar  REFERRING PROVIDER: Dulce Sellar  REFERRING DIAG: L hip pain  THERAPY DIAG:  Pain in left hip  Other muscle spasm  Abnormal posture  Muscle weakness (generalized)  Unspecified lack of coordination  Rationale for Evaluation and Treatment: Rehabilitation  ONSET DATE: April 2023   SUBJECTIVE:   SUBJECTIVE STATEMENT:  12/05/2023 Pt states she notices her hip feels sore when she's up moving around.  Eval: Pt had fall in April 2023, with hip fracture, and subsequent THA. She states good recovery after surgery,  but did start having some bursitis type pain in the fall. She was having PT for this as well as pelvic floor. She stopped PT in September, and notes increased pain in the last couple months. She has stayed active walking and doing exercises, some gym machines and some classes - chair yoga.  She has newer diagnosis of Oseoporosis, currently not taking medication for treatment for this. She has 1 step to enter her home.  States pain in  hip, feels tight, stiff, knotted.    PERTINENT HISTORY: L THA  April 2023   PAIN:  Are you having pain? Yes: NPRS scale: 0 /10 Pain location: L hip Pain description: tight, stiff, knot Aggravating factors: walking, increased activity  Relieving factors: none stated   PRECAUTIONS: None  WEIGHT BEARING RESTRICTIONS: No  FALLS:  Has patient fallen in last 6 months? No  PLOF: Independent  PATIENT GOALS:  Decreased pain   NEXT MD VISIT:   OBJECTIVE:   DIAGNOSTIC FINDINGS:   PATIENT SURVEYS:    COGNITION: Overall cognitive status: Within functional limits for tasks assessed     SENSATION: WFL  EDEMA:   POSTURE:      PALPATION:  Most Tenderness in L gr troch, and mild soreness in glute/rotators    LOWER EXTREMITY ROM:  Lumbar: WFL Hips: R: WFL,  L: mild limitation for ER, IR Knees: WFL  LOWER EXTREMITY MMT:  MMT Left  eval Right  eval  Hip flexion 3 (noted IR position) 4+  Hip extension    Hip abduction 4-   Hip adduction    Hip internal rotation    Hip external rotation    Knee flexion 4 5  Knee extension 4- 4+  Ankle dorsiflexion    Ankle plantarflexion    Ankle inversion    Ankle eversion     (Blank rows = not tested)  LOWER EXTREMITY SPECIAL TESTS:    GAIT:   TODAY'S TREATMENT:                                                                                                                              DATE:   12/05/2023 Therapeutic Exercise: Aerobic:  Bike L1 x 4 min  Supine:   Bridges with ball squeeze   2x12; SLR, L 2x10, with cueing for hip ER and maintaining knee ext;  S/L:   Hip abd x 15 on L, with pillow under L hip; Seated: LAQ 2.5lb, 2 x 10;  Prone: " Standing: Stretches:  Neuromuscular Re-education: S/L:   Prone:  Supine: Dead bugs bil x12; Bird dogs bil x 10; Marching with LE extended x10; Sit to stands 2x12, RTB around her knees & cueing to promote proper knee positioning Manual Therapy: Therapeutic Activity: Self Care:   Previous Therapeutic Exercise: Aerobic:  Bike L1 x 4 min  Supine:   Bridges with ball squeeze  2x10;  S/L: Clam shells 2x10 L;   Seated: LAQ 2lb, 2 x 10;  Prone: "; Standing: Stretches:  Neuromuscular Re-education: S/L: hip abd x 15 on L, with pillow under L hip;  Prone: Hamstring curls, 4# L 15x3" Supine: SLR, L x15, with cueing for hip ER and maintaining knee ext Manual Therapy: Therapeutic Activity: Self Care:    Therapeutic Exercise: Aerobic:  Bike L1 x 6 min- education on optimal leg position;  Supine:   Bridges with ball squeeze  2x10  S/L: Clam shells 2x10 L;  hip abd x 15 on L;  trial for hip adduction in s/l- too painful to lay on L. Seated: LAQ 2lb, 2 x 10;  sit to stand/higher mat table with cueing for leg position;  Prone:  Standing: Stretches:  Neuromuscular Re-education: Manual Therapy: Therapeutic Activity: Self Care:    PATIENT EDUCATION:  Education details: HEP Person educated: Patient Education method: Programmer, multimedia, Demonstration, Actor cues, Verbal cues, and Handouts Education comprehension: verbalized understanding, returned demonstration, verbal cues required, tactile cues required, and needs further education   HOME EXERCISE PROGRAM: Access Code: [email or web address] URL: [email or web address] Date: 11/13/2023 Prepared by: Sedalia Muta  Exercises - Clamshell  - 1 x daily - 1-2 sets - 10 reps - Sidelying Hip Abduction  - 1 x daily - 1 sets - 10-15 reps - Straight Leg Raise  - 1 x daily - 2 sets - 5  reps - Prone Hip Extension with Bent Knee  - 1 x daily - 4  sets - 5 reps - Seated Knee Extension AROM  - 1 x daily - 1-2 sets - 10 reps - Supine Bridge  - 1 x daily - 2 sets - 10 reps - Supine Hip Adduction Isometric with Ball  - 1 x daily - 2 sets - 10 reps - Sit to Stand Without Arm Support  - 1 x daily - 2-3 x weekly - 2 sets - 8-12 reps  ASSESSMENT:   CLINICAL IMPRESSION: 12/05/2023 Session focused on ther ex to promote LE strengthening & NMRE to promote core re-ed required for standing & dynamic tasks. Pt was able to progress LAQ by increasing weight used, and completing sit to stands, deadbug, and bird dogs exercises. Pt able to perform sit to stands and required verbal and tactile cueing to promote proper knee positioning. Pt showed improvement with controlling bil knee medial translation with RTB around the knees. Pt continues to have difficulty with lumbopelvic control, medial knee translation during weightbearing, and hip drop secondary to lumbopelvic motor control & strength and bil LEs weakness -although steady progress is being made. Continue to promote lumbopelvic and LEs strength and motor control as tolerated.      Eval: Patient presents with primary complaint of  pain in L hip. She has significant weakness with testing today for anterior hip >lateral hip, which is likely contributing to her pain with activity. She will benefit from education on HEP and strengthening for hip, to improve mechanics and pain with standing and functional activities.Pt with decreased ability and tolerance for functional activities, IADLS, stairs, and walking. Pt will  benefit from skilled PT to improve deficits and pain and to return to PLOF.   OBJECTIVE IMPAIRMENTS: Abnormal gait, decreased activity tolerance, decreased mobility, decreased ROM, decreased strength, increased muscle spasms, improper body mechanics, and pain.   ACTIVITY LIMITATIONS: lifting, bending, standing, squatting, stairs,  transfers, hygiene/grooming, and locomotion level  PARTICIPATION LIMITATIONS: cleaning, driving, and community activity  PERSONAL FACTORS: Past/current experiences and Time since onset of injury/illness/exacerbation are also affecting patient's functional outcome.   REHAB POTENTIAL: Good  CLINICAL DECISION MAKING: Stable/uncomplicated  EVALUATION COMPLEXITY: Low   GOALS: Goals reviewed with patient? Yes  SHORT TERM GOALS: Target date: 11/27/2023  Pt to be independent with initial HEP  Goal status: INITIAL  2.  Pt to demo ability for SLR with optimal mechanics (strength to at least 3/5)   Goal status: INITIAL   LONG TERM GOALS: Target date: 01/08/2024  Pt to be independent with final HEP  Goal status: INITIAL  2.  Pt to demo improved strength of L hip to be at least 4/5 to improve stability, stairs, and gait   Goal status: INITIAL  3.  Pt to report decreased pain in L hip to 0-2/10 with activity.   Goal status: INITIAL  4.  Pt to improve ability for stairs climbing without pain or deficit, with 1 UE support.   Goal status: INITIAL    PLAN:  PT FREQUENCY: 1-2x/week  PT DURATION: 8 weeks  PLANNED INTERVENTIONS: Therapeutic exercises, Therapeutic activity, Neuromuscular re-education, Patient/Family education, Self Care, Joint mobilization, Joint manipulation, Stair training, Orthotic/Fit training, DME instructions, Aquatic Therapy, Dry Needling, Electrical stimulation, Cryotherapy, Moist heat, Taping, Ultrasound, Ionotophoresis 4mg /ml Dexamethasone, Manual therapy,  Vasopneumatic device, Traction, Spinal manipulation, Spinal mobilization,Balance training, Gait training,   PLAN FOR NEXT SESSION:  Review bird dog exercise next session. Squats with band    [employer], SPT     This entire session was performed under direct supervision  and direction of a licensed Estate agent . I have personally read, edited and approve of the note as  written.  Sedalia Muta, PT, DPT 12:21 PM  12/05/23

## 2023-12-09 DIAGNOSIS — R197 Diarrhea, unspecified: Secondary | ICD-10-CM | POA: Diagnosis not present

## 2023-12-10 ENCOUNTER — Ambulatory Visit: Payer: Medicare Other | Admitting: Physical Therapy

## 2023-12-10 ENCOUNTER — Encounter: Payer: Self-pay | Admitting: Physical Therapy

## 2023-12-10 DIAGNOSIS — M62838 Other muscle spasm: Secondary | ICD-10-CM

## 2023-12-10 DIAGNOSIS — M25552 Pain in left hip: Secondary | ICD-10-CM | POA: Diagnosis not present

## 2023-12-10 NOTE — Therapy (Signed)
OUTPATIENT PHYSICAL THERAPY LOWER EXTREMITY Treatment   Patient Name: Mackenzie Green MRN: [id number] DOB:August 03, 1946, 77 y.o., female Today's Date: 12/10/2023  END OF SESSION:  PT End of Session - 12/10/23 1128     Visit Number 7    Number of Visits 16    Date for PT Re-Evaluation 01/08/24    Authorization Type [employer] medicare    PT Start Time 1102    PT Stop Time 1146    PT Time Calculation (min) 44 min    Activity Tolerance Patient tolerated treatment well;No increased pain    Behavior During Therapy [facility] for tasks assessed/performed                  Past Medical History:  Diagnosis Date   Hypoglycemia 07/30/2012   Hypokalemia 07/31/2012   Hyponatremia 07/30/2012   Low iron    Mild cognitive impairment with memory loss    Mitral valve prolapse    Neutropenia (HCC) 08/27/2016   Past Surgical History:  Procedure Laterality Date   TEAR DUCT PROBING     TOTAL HIP ARTHROPLASTY Left 03/27/2022   Procedure: LEFT POSTERIOR TOTAL HIP ARTHROPLASTY;  Surgeon: Joen Laura, MD;  Location: [facility] OR;  Service: Orthopedics;  Laterality: Left;   Patient Active Problem List   Diagnosis Date Noted   Age related osteoporosis 08/22/2023   Irritable bowel syndrome with diarrhea 08/22/2023   Hip fracture (HCC) 03/26/2022   Mild cognitive impairment with memory loss 03/26/2022   Spinal stenosis at L4-L5 level 03/26/2022    PCP: Dulce Sellar  REFERRING PROVIDER: Dulce Sellar  REFERRING DIAG: L hip pain  THERAPY DIAG:  Pain in left hip  Other muscle spasm  Rationale for Evaluation and Treatment: Rehabilitation  ONSET DATE: April 2023   SUBJECTIVE:   SUBJECTIVE STATEMENT:  12/10/2023 Pt states hip is sore, has been doing HEP  Eval: Pt had fall in April 2023, with hip fracture, and subsequent THA. She states good recovery after surgery, but did start having some bursitis type pain in the fall. She was having PT for this as well as pelvic floor. She  stopped PT in September, and notes increased pain in the last couple months. She has stayed active walking and doing exercises, some gym machines and some classes - chair yoga.  She has newer diagnosis of Oseoporosis, currently not taking medication for treatment for this. She has 1 step to enter her home.  States pain in  hip, feels tight, stiff, knotted.    PERTINENT HISTORY: L THA  April 2023   PAIN:  Are you having pain? Yes: NPRS scale: 0-4 /10 Pain location: L hip Pain description: tight, stiff, knot Aggravating factors: walking, increased activity  Relieving factors: none stated   PRECAUTIONS: None  WEIGHT BEARING RESTRICTIONS: No  FALLS:  Has patient fallen in last 6 months? No  PLOF: Independent  PATIENT GOALS:  Decreased pain   NEXT MD VISIT:   OBJECTIVE:   DIAGNOSTIC FINDINGS:   PATIENT SURVEYS:    COGNITION: Overall cognitive status: Within functional limits for tasks assessed     SENSATION: WFL  EDEMA:   POSTURE:      PALPATION:  Most Tenderness in L gr troch, and mild soreness in glute/rotators    LOWER EXTREMITY ROM:  Lumbar: WFL Hips: R: WFL,  L: mild limitation for ER, IR Knees: WFL  LOWER EXTREMITY MMT:  MMT Left eval Right  eval  Hip flexion 3 (noted IR position) 4+  Hip extension  Hip abduction 4-   Hip adduction    Hip internal rotation    Hip external rotation    Knee flexion 4 5  Knee extension 4- 4+  Ankle dorsiflexion    Ankle plantarflexion    Ankle inversion    Ankle eversion     (Blank rows = not tested)  LOWER EXTREMITY SPECIAL TESTS:    GAIT:   TODAY'S TREATMENT:                                                                                                                              DATE:   12/10/2023 Therapeutic Exercise: Aerobic:  Bike L2 x 5 min, cueing for keeping thigh straight   Supine:    SLR, L  with ER  2x10,  S/L:    Seated:  Prone:  Standing: marching/slow x 25;  SLR with ER x 15;   Hip add x 15;   Squats x 15 with cueing for  Stretches:  Neuromuscular Re-education: Manual Therapy: Therapeutic Activity: Self Care:   Previous Therapeutic Exercise: Aerobic:  Bike L1 x 4 min  Supine:   Bridges with ball squeeze  2x10;  S/L: Clam shells 2x10 L;   Seated: LAQ 2lb, 2 x 10;  Prone: "; Standing: Stretches:  Neuromuscular Re-education: S/L: hip abd x 15 on L, with pillow under L hip;  Prone: Hamstring curls, 4# L 15x3" Supine: SLR, L x15, with cueing for hip ER and maintaining knee ext Manual Therapy: Therapeutic Activity: Self Care:    Therapeutic Exercise: Aerobic:  Bike L1 x 6 min- education on optimal leg position;  Supine:   Bridges with ball squeeze  2x10  S/L: Clam shells 2x10 L;  hip abd x 15 on L;  trial for hip adduction in s/l- too painful to lay on L. Seated: LAQ 2lb, 2 x 10;  sit to stand/higher mat table with cueing for leg position;  Prone:  Standing: Stretches:  Neuromuscular Re-education: Manual Therapy: Therapeutic Activity: Self Care:    PATIENT EDUCATION:  Education details: HEP Person educated: Patient Education method: Programmer, multimedia, Demonstration, Actor cues, Verbal cues, and Handouts Education comprehension: verbalized understanding, returned demonstration, verbal cues required, tactile cues required, and needs further education   HOME EXERCISE PROGRAM: Access Code: [email or web address] URL: [email or web address] Date: 11/13/2023 Prepared by: Sedalia Muta  Exercises - Clamshell  - 1 x daily - 1-2 sets - 10 reps - Sidelying Hip Abduction  - 1 x daily - 1 sets - 10-15 reps - Straight Leg Raise  - 1 x daily - 2 sets - 5 reps - Prone Hip Extension with Bent Knee  - 1 x daily - 4 sets - 5 reps - Seated Knee Extension AROM  - 1 x daily - 1-2 sets - 10 reps - Supine Bridge  - 1 x daily - 2 sets - 10 reps - Supine Hip Adduction Isometric with Ball  - 1 x daily - 2 sets -  10 reps - Sit to Stand Without Arm Support  - 1 x  daily - 2-3 x weekly - 2 sets - 8-12 reps  ASSESSMENT:   CLINICAL IMPRESSION: 12/10/2023 Pt with improving ability for LE mechanics with ther ex, although still requires cuing for leg position due to weakness. Sit to stand, squat and SLR improved today. Pt continues to have weakness and muscle imbalance that is effecting hip/LE mechanics. Plan to progress strength as toleated.   Eval: Session focused on ther ex to promote LE strengthening & NMRE to promote core re-ed required for standing & dynamic tasks. Pt was able to progress LAQ by increasing weight used, and completing sit to stands, deadbug, and bird dogs exercises. Pt able to perform sit to stands and required verbal and tactile cueing to promote proper knee positioning. Pt showed improvement with controlling bil knee medial translation with RTB around the knees. Pt continues to have difficulty with lumbopelvic control, medial knee translation during weightbearing, and hip drop secondary to lumbopelvic motor control & strength and bil LEs weakness -although steady progress is being made. Continue to promote lumbopelvic and LEs strength and motor control as tolerated.      Eval: Patient presents with primary complaint of  pain in L hip. She has significant weakness with testing today for anterior hip >lateral hip, which is likely contributing to her pain with activity. She will benefit from education on HEP and strengthening for hip, to improve mechanics and pain with standing and functional activities.Pt with decreased ability and tolerance for functional activities, IADLS, stairs, and walking. Pt will  benefit from skilled PT to improve deficits and pain and to return to PLOF.   OBJECTIVE IMPAIRMENTS: Abnormal gait, decreased activity tolerance, decreased mobility, decreased ROM, decreased strength, increased muscle spasms, improper body mechanics, and pain.   ACTIVITY LIMITATIONS: lifting, bending, standing, squatting, stairs, transfers,  hygiene/grooming, and locomotion level  PARTICIPATION LIMITATIONS: cleaning, driving, and community activity  PERSONAL FACTORS: Past/current experiences and Time since onset of injury/illness/exacerbation are also affecting patient's functional outcome.   REHAB POTENTIAL: Good  CLINICAL DECISION MAKING: Stable/uncomplicated  EVALUATION COMPLEXITY: Low   GOALS: Goals reviewed with patient? Yes  SHORT TERM GOALS: Target date: 11/27/2023  Pt to be independent with initial HEP  Goal status: INITIAL  2.  Pt to demo ability for SLR with optimal mechanics (strength to at least 3/5)   Goal status: INITIAL   LONG TERM GOALS: Target date: 01/08/2024  Pt to be independent with final HEP  Goal status: INITIAL  2.  Pt to demo improved strength of L hip to be at least 4/5 to improve stability, stairs, and gait   Goal status: INITIAL  3.  Pt to report decreased pain in L hip to 0-2/10 with activity.   Goal status: INITIAL  4.  Pt to improve ability for stairs climbing without pain or deficit, with 1 UE support.   Goal status: INITIAL    PLAN:  PT FREQUENCY: 1-2x/week  PT DURATION: 8 weeks  PLANNED INTERVENTIONS: Therapeutic exercises, Therapeutic activity, Neuromuscular re-education, Patient/Family education, Self Care, Joint mobilization, Joint manipulation, Stair training, Orthotic/Fit training, DME instructions, Aquatic Therapy, Dry Needling, Electrical stimulation, Cryotherapy, Moist heat, Taping, Ultrasound, Ionotophoresis 4mg /ml Dexamethasone, Manual therapy,  Vasopneumatic device, Traction, Spinal manipulation, Spinal mobilization,Balance training, Gait training,   PLAN FOR NEXT SESSION:  hip strength, pelvic floor review with TA;    Sedalia Muta, PT, DPT 12:08 PM  12/10/23

## 2023-12-12 ENCOUNTER — Encounter: Payer: Self-pay | Admitting: Physical Therapy

## 2023-12-12 ENCOUNTER — Ambulatory Visit: Payer: Medicare Other | Admitting: Physical Therapy

## 2023-12-12 DIAGNOSIS — M6281 Muscle weakness (generalized): Secondary | ICD-10-CM | POA: Diagnosis not present

## 2023-12-12 DIAGNOSIS — M62838 Other muscle spasm: Secondary | ICD-10-CM

## 2023-12-12 DIAGNOSIS — M25552 Pain in left hip: Secondary | ICD-10-CM | POA: Diagnosis not present

## 2023-12-12 NOTE — Therapy (Unsigned)
OUTPATIENT PHYSICAL THERAPY LOWER EXTREMITY Treatment   Patient Name: QUANEESHA CHESLER MRN: [id number] DOB:08-11-1946, 77 y.o., female Today's Date: 12/12/2023  END OF SESSION:  PT End of Session - 12/12/23 0933     Visit Number 8    Number of Visits 16    Date for PT Re-Evaluation 01/08/24    Authorization Type [employer] medicare    PT Start Time [phone]    PT Stop Time 1015    PT Time Calculation (min) 41 min    Activity Tolerance Patient tolerated treatment well;No increased pain    Behavior During Therapy [facility] for tasks assessed/performed                  Past Medical History:  Diagnosis Date   Hypoglycemia 07/30/2012   Hypokalemia 07/31/2012   Hyponatremia 07/30/2012   Low iron    Mild cognitive impairment with memory loss    Mitral valve prolapse    Neutropenia (HCC) 08/27/2016   Past Surgical History:  Procedure Laterality Date   TEAR DUCT PROBING     TOTAL HIP ARTHROPLASTY Left 03/27/2022   Procedure: LEFT POSTERIOR TOTAL HIP ARTHROPLASTY;  Surgeon: Joen Laura, MD;  Location: [facility] OR;  Service: Orthopedics;  Laterality: Left;   Patient Active Problem List   Diagnosis Date Noted   Age related osteoporosis 08/22/2023   Irritable bowel syndrome with diarrhea 08/22/2023   Hip fracture (HCC) 03/26/2022   Mild cognitive impairment with memory loss 03/26/2022   Spinal stenosis at L4-L5 level 03/26/2022    PCP: Dulce Sellar  REFERRING PROVIDER: Dulce Sellar  REFERRING DIAG: L hip pain  THERAPY DIAG:  Pain in left hip  Other muscle spasm  Muscle weakness (generalized)  Rationale for Evaluation and Treatment: Rehabilitation  ONSET DATE: April 2023   SUBJECTIVE:   SUBJECTIVE STATEMENT:  12/12/2023 Pt states muscle soreness from doing new exercises yesterday. Has been doing HEP.   Eval: Pt had fall in April 2023, with hip fracture, and subsequent THA. She states good recovery after surgery, but did start having some bursitis type  pain in the fall. She was having PT for this as well as pelvic floor. She stopped PT in September, and notes increased pain in the last couple months. She has stayed active walking and doing exercises, some gym machines and some classes - chair yoga.  She has newer diagnosis of Oseoporosis, currently not taking medication for treatment for this. She has 1 step to enter her home.  States pain in  hip, feels tight, stiff, knotted.    PERTINENT HISTORY: L THA  April 2023   PAIN:  Are you having pain? Yes: NPRS scale: 0-4 /10 Pain location: L hip Pain description: tight, stiff, knot Aggravating factors: walking, increased activity  Relieving factors: none stated   PRECAUTIONS: None  WEIGHT BEARING RESTRICTIONS: No  FALLS:  Has patient fallen in last 6 months? No  PLOF: Independent  PATIENT GOALS:  Decreased pain   NEXT MD VISIT:   OBJECTIVE:   DIAGNOSTIC FINDINGS:   PATIENT SURVEYS:    COGNITION: Overall cognitive status: Within functional limits for tasks assessed     SENSATION: WFL  EDEMA:   POSTURE:      PALPATION:  Most Tenderness in L gr troch, and mild soreness in glute/rotators    LOWER EXTREMITY ROM:  Lumbar: WFL Hips: R: WFL,  L: mild limitation for ER, IR Knees: WFL  LOWER EXTREMITY MMT:  MMT Left eval Right  eval  Hip  flexion 3 (noted IR position) 4+  Hip extension    Hip abduction 4-   Hip adduction    Hip internal rotation    Hip external rotation    Knee flexion 4 5  Knee extension 4- 4+  Ankle dorsiflexion    Ankle plantarflexion    Ankle inversion    Ankle eversion     (Blank rows = not tested)  LOWER EXTREMITY SPECIAL TESTS:    GAIT:   TODAY'S TREATMENT:                                                                                                                              DATE:   12/12/2023 Therapeutic Exercise: Aerobic:  Bike L2 x 5 min, cueing for keeping thigh straight   Supine:    SLR, L  with ER  2x10,  S/L:     Seated:  Prone:  Standing: marching/slow x 25;  SLR with ER x 15;  Hip add x 15;   Squats x 15 with cueing for  Stretches:  Neuromuscular Re-education: Manual Therapy: Therapeutic Activity: Self Care:   Previous Therapeutic Exercise: Aerobic:  Bike L1 x 4 min  Supine:   Bridges with ball squeeze  2x10;  S/L: Clam shells 2x10 L;   Seated: LAQ 2lb, 2 x 10;  Prone: "; Standing: Stretches:  Neuromuscular Re-education: S/L: hip abd x 15 on L, with pillow under L hip;  Prone: Hamstring curls, 4# L 15x3" Supine: SLR, L x15, with cueing for hip ER and maintaining knee ext Manual Therapy: Therapeutic Activity: Self Care:    Therapeutic Exercise: Aerobic:  Bike L1 x 6 min- education on optimal leg position;  Supine:   Bridges with ball squeeze  2x10  S/L: Clam shells 2x10 L;  hip abd x 15 on L;  trial for hip adduction in s/l- too painful to lay on L. Seated: LAQ 2lb, 2 x 10;  sit to stand/higher mat table with cueing for leg position;  Prone:  Standing: Stretches:  Neuromuscular Re-education: Manual Therapy: Therapeutic Activity: Self Care:    PATIENT EDUCATION:  Education details: HEP Person educated: Patient Education method: Programmer, multimedia, Demonstration, Actor cues, Verbal cues, and Handouts Education comprehension: verbalized understanding, returned demonstration, verbal cues required, tactile cues required, and needs further education   HOME EXERCISE PROGRAM: Access Code: [email or web address] URL: [email or web address] Date: 11/13/2023 Prepared by: Sedalia Muta  Exercises - Clamshell  - 1 x daily - 1-2 sets - 10 reps - Sidelying Hip Abduction  - 1 x daily - 1 sets - 10-15 reps - Straight Leg Raise  - 1 x daily - 2 sets - 5 reps - Prone Hip Extension with Bent Knee  - 1 x daily - 4 sets - 5 reps - Seated Knee Extension AROM  - 1 x daily - 1-2 sets - 10 reps - Supine Bridge  - 1 x daily - 2 sets - 10 reps - Supine Hip Adduction  Isometric with Ball  - 1 x  daily - 2 sets - 10 reps - Sit to Stand Without Arm Support  - 1 x daily - 2-3 x weekly - 2 sets - 8-12 reps  ASSESSMENT:   CLINICAL IMPRESSION: 12/12/2023 Pt with improving ability for LE mechanics with ther ex, although still requires cuing for leg position due to weakness. Sit to stand, squat and SLR improved today. Pt continues to have weakness and muscle imbalance that is effecting hip/LE mechanics. Plan to progress strength as toleated.   Eval: Session focused on ther ex to promote LE strengthening & NMRE to promote core re-ed required for standing & dynamic tasks. Pt was able to progress LAQ by increasing weight used, and completing sit to stands, deadbug, and bird dogs exercises. Pt able to perform sit to stands and required verbal and tactile cueing to promote proper knee positioning. Pt showed improvement with controlling bil knee medial translation with RTB around the knees. Pt continues to have difficulty with lumbopelvic control, medial knee translation during weightbearing, and hip drop secondary to lumbopelvic motor control & strength and bil LEs weakness -although steady progress is being made. Continue to promote lumbopelvic and LEs strength and motor control as tolerated.      Eval: Patient presents with primary complaint of  pain in L hip. She has significant weakness with testing today for anterior hip >lateral hip, which is likely contributing to her pain with activity. She will benefit from education on HEP and strengthening for hip, to improve mechanics and pain with standing and functional activities.Pt with decreased ability and tolerance for functional activities, IADLS, stairs, and walking. Pt will  benefit from skilled PT to improve deficits and pain and to return to PLOF.   OBJECTIVE IMPAIRMENTS: Abnormal gait, decreased activity tolerance, decreased mobility, decreased ROM, decreased strength, increased muscle spasms, improper body mechanics, and pain.   ACTIVITY  LIMITATIONS: lifting, bending, standing, squatting, stairs, transfers, hygiene/grooming, and locomotion level  PARTICIPATION LIMITATIONS: cleaning, driving, and community activity  PERSONAL FACTORS: Past/current experiences and Time since onset of injury/illness/exacerbation are also affecting patient's functional outcome.   REHAB POTENTIAL: Good  CLINICAL DECISION MAKING: Stable/uncomplicated  EVALUATION COMPLEXITY: Low   GOALS: Goals reviewed with patient? Yes  SHORT TERM GOALS: Target date: 11/27/2023  Pt to be independent with initial HEP  Goal status: INITIAL  2.  Pt to demo ability for SLR with optimal mechanics (strength to at least 3/5)   Goal status: INITIAL   LONG TERM GOALS: Target date: 01/08/2024  Pt to be independent with final HEP  Goal status: INITIAL  2.  Pt to demo improved strength of L hip to be at least 4/5 to improve stability, stairs, and gait   Goal status: INITIAL  3.  Pt to report decreased pain in L hip to 0-2/10 with activity.   Goal status: INITIAL  4.  Pt to improve ability for stairs climbing without pain or deficit, with 1 UE support.   Goal status: INITIAL    PLAN:  PT FREQUENCY: 1-2x/week  PT DURATION: 8 weeks  PLANNED INTERVENTIONS: Therapeutic exercises, Therapeutic activity, Neuromuscular re-education, Patient/Family education, Self Care, Joint mobilization, Joint manipulation, Stair training, Orthotic/Fit training, DME instructions, Aquatic Therapy, Dry Needling, Electrical stimulation, Cryotherapy, Moist heat, Taping, Ultrasound, Ionotophoresis 4mg /ml Dexamethasone, Manual therapy,  Vasopneumatic device, Traction, Spinal manipulation, Spinal mobilization,Balance training, Gait training,   PLAN FOR NEXT SESSION:  hip strength, pelvic floor review with TA;    Sedalia Muta, PT, DPT  9:33 AM  12/12/23

## 2023-12-15 ENCOUNTER — Ambulatory Visit: Payer: Medicare Other | Admitting: Physical Therapy

## 2023-12-15 ENCOUNTER — Encounter: Payer: Self-pay | Admitting: Physical Therapy

## 2023-12-15 DIAGNOSIS — M62838 Other muscle spasm: Secondary | ICD-10-CM

## 2023-12-15 DIAGNOSIS — M6281 Muscle weakness (generalized): Secondary | ICD-10-CM

## 2023-12-15 DIAGNOSIS — M25552 Pain in left hip: Secondary | ICD-10-CM | POA: Diagnosis not present

## 2023-12-15 NOTE — Therapy (Signed)
OUTPATIENT PHYSICAL THERAPY LOWER EXTREMITY Treatment   Patient Name: Mackenzie Green MRN: [id number] DOB:10/21/1946, 77 y.o., female Today's Date: 12/15/2023  END OF SESSION:  PT End of Session - 12/15/23 1022     Visit Number 9    Number of Visits 16    Date for PT Re-Evaluation 01/08/24    Authorization Type [employer] medicare    PT Start Time 1022    PT Stop Time 1103    PT Time Calculation (min) 41 min    Activity Tolerance Patient tolerated treatment well;No increased pain    Behavior During Therapy [facility] for tasks assessed/performed                  Past Medical History:  Diagnosis Date   Hypoglycemia 07/30/2012   Hypokalemia 07/31/2012   Hyponatremia 07/30/2012   Low iron    Mild cognitive impairment with memory loss    Mitral valve prolapse    Neutropenia (HCC) 08/27/2016   Past Surgical History:  Procedure Laterality Date   TEAR DUCT PROBING     TOTAL HIP ARTHROPLASTY Left 03/27/2022   Procedure: LEFT POSTERIOR TOTAL HIP ARTHROPLASTY;  Surgeon: Joen Laura, MD;  Location: [facility] OR;  Service: Orthopedics;  Laterality: Left;   Patient Active Problem List   Diagnosis Date Noted   Age related osteoporosis 08/22/2023   Irritable bowel syndrome with diarrhea 08/22/2023   Hip fracture (HCC) 03/26/2022   Mild cognitive impairment with memory loss 03/26/2022   Spinal stenosis at L4-L5 level 03/26/2022    PCP: Dulce Sellar  REFERRING PROVIDER: Dulce Sellar  REFERRING DIAG: L hip pain  THERAPY DIAG:  Pain in left hip  Other muscle spasm  Muscle weakness (generalized)  Rationale for Evaluation and Treatment: Rehabilitation  ONSET DATE: April 2023   SUBJECTIVE:   SUBJECTIVE STATEMENT:  12/15/2023 Pt states muscle soreness from doing  exercises. Has been doing HEP.  Thinks leg is getting stronger, and thinks hip has not been as sore posteriorly.   Eval: Pt had fall in April 2023, with hip fracture, and subsequent THA. She states  good recovery after surgery, but did start having some bursitis type pain in the fall. She was having PT for this as well as pelvic floor. She stopped PT in September, and notes increased pain in the last couple months. She has stayed active walking and doing exercises, some gym machines and some classes - chair yoga.  She has newer diagnosis of Oseoporosis, currently not taking medication for treatment for this. She has 1 step to enter her home.  States pain in  hip, feels tight, stiff, knotted.    PERTINENT HISTORY: L THA  April 2023   PAIN:  Are you having pain? Yes: NPRS scale: 0-4 /10 Pain location: L hip Pain description: tight, stiff, knot Aggravating factors: walking, increased activity  Relieving factors: none stated   PRECAUTIONS: None  WEIGHT BEARING RESTRICTIONS: No  FALLS:  Has patient fallen in last 6 months? No  PLOF: Independent  PATIENT GOALS:  Decreased pain   NEXT MD VISIT:   OBJECTIVE:   DIAGNOSTIC FINDINGS:   PATIENT SURVEYS:    COGNITION: Overall cognitive status: Within functional limits for tasks assessed     SENSATION: WFL  EDEMA:   POSTURE:      PALPATION:  Most Tenderness in L gr troch, and mild soreness in glute/rotators    LOWER EXTREMITY ROM:  Lumbar: WFL Hips: R: WFL,  L: mild limitation for ER, IR Knees:  WFL  LOWER EXTREMITY MMT:  MMT Left eval Right  eval  Hip flexion 3 (noted IR position) 4+  Hip extension    Hip abduction 4-   Hip adduction    Hip internal rotation    Hip external rotation    Knee flexion 4 5  Knee extension 4- 4+  Ankle dorsiflexion    Ankle plantarflexion    Ankle inversion    Ankle eversion     (Blank rows = not tested)  LOWER EXTREMITY SPECIAL TESTS:    GAIT:   TODAY'S TREATMENT:                                                                                                                              DATE:   12/15/2023 Therapeutic Exercise: Aerobic:   Supine:    S/L:   (discussed continued Hip add in s/l for HEP)  Seated:  Prone:  Standing: marching/slow x 25;   Squats 2 x 10 at mat table ; Hip abd and ext  2 x 10 bil; Step ups 6 in , 1 ue light support,  x 12 bil;  Stretches:  Neuromuscular Re-education: Manual Therapy: Therapeutic Activity: Self Care:   Previous Therapeutic Exercise: Aerobic:  Bike L1 x 4 min  Supine:   Bridges with ball squeeze  2x10;  S/L: Clam shells 2x10 L;   Seated: LAQ 2lb, 2 x 10;  Prone: "; Standing: Stretches:  Neuromuscular Re-education: S/L: hip abd x 15 on L, with pillow under L hip;  Prone: Hamstring curls, 4# L 15x3" Supine: SLR, L x15, with cueing for hip ER and maintaining knee ext Manual Therapy: Therapeutic Activity: Self Care:    Therapeutic Exercise: Aerobic:  Bike L1 x 6 min- education on optimal leg position;  Supine:   Bridges with ball squeeze  2x10  S/L: Clam shells 2x10 L;  hip abd x 15 on L;  trial for hip adduction in s/l- too painful to lay on L. Seated: LAQ 2lb, 2 x 10;  sit to stand/higher mat table with cueing for leg position;  Prone:  Standing: Stretches:  Neuromuscular Re-education: Manual Therapy: Therapeutic Activity: Self Care:    PATIENT EDUCATION:  Education details: HEP Person educated: Patient Education method: Programmer, multimedia, Demonstration, Actor cues, Verbal cues, and Handouts Education comprehension: verbalized understanding, returned demonstration, verbal cues required, tactile cues required, and needs further education   HOME EXERCISE PROGRAM: Access Code: [email or web address] URL: [email or web address] Date: 11/13/2023 Prepared by: Sedalia Muta  Exercises - Clamshell  - 1 x daily - 1-2 sets - 10 reps - Sidelying Hip Abduction  - 1 x daily - 1 sets - 10-15 reps - Straight Leg Raise  - 1 x daily - 2 sets - 5 reps - Prone Hip Extension with Bent Knee  - 1 x daily - 4 sets - 5 reps - Seated Knee Extension AROM  - 1 x daily - 1-2 sets - 10 reps - Supine  Bridge  - 1 x daily - 2 sets - 10 reps - Supine Hip Adduction Isometric with Ball  - 1 x daily - 2 sets - 10 reps - Sit to Stand Without Arm Support  - 1 x daily - 2-3 x weekly - 2 sets - 8-12 reps  ASSESSMENT:   CLINICAL IMPRESSION: 12/15/2023 Pt with improving ability for LE ther ex for strengthening. She is still very challenged with thise, and requires mod/max cueing for mechanics, posture, and speed. Will continue to focus on strengthening as tolerated.    Eval: Session focused on ther ex to promote LE strengthening & NMRE to promote core re-ed required for standing & dynamic tasks. Pt was able to progress LAQ by increasing weight used, and completing sit to stands, deadbug, and bird dogs exercises. Pt able to perform sit to stands and required verbal and tactile cueing to promote proper knee positioning. Pt showed improvement with controlling bil knee medial translation with RTB around the knees. Pt continues to have difficulty with lumbopelvic control, medial knee translation during weightbearing, and hip drop secondary to lumbopelvic motor control & strength and bil LEs weakness -although steady progress is being made. Continue to promote lumbopelvic and LEs strength and motor control as tolerated.  Eval: Patient presents with primary complaint of  pain in L hip. She has significant weakness with testing today for anterior hip >lateral hip, which is likely contributing to her pain with activity. She will benefit from education on HEP and strengthening for hip, to improve mechanics and pain with standing and functional activities.Pt with decreased ability and tolerance for functional activities, IADLS, stairs, and walking. Pt will  benefit from skilled PT to improve deficits and pain and to return to PLOF.   OBJECTIVE IMPAIRMENTS: Abnormal gait, decreased activity tolerance, decreased mobility, decreased ROM, decreased strength, increased muscle spasms, improper body mechanics, and pain.    ACTIVITY LIMITATIONS: lifting, bending, standing, squatting, stairs, transfers, hygiene/grooming, and locomotion level  PARTICIPATION LIMITATIONS: cleaning, driving, and community activity  PERSONAL FACTORS: Past/current experiences and Time since onset of injury/illness/exacerbation are also affecting patient's functional outcome.   REHAB POTENTIAL: Good  CLINICAL DECISION MAKING: Stable/uncomplicated  EVALUATION COMPLEXITY: Low   GOALS: Goals reviewed with patient? Yes  SHORT TERM GOALS: Target date: 11/27/2023  Pt to be independent with initial HEP  Goal status: INITIAL  2.  Pt to demo ability for SLR with optimal mechanics (strength to at least 3/5)   Goal status: INITIAL   LONG TERM GOALS: Target date: 01/08/2024  Pt to be independent with final HEP  Goal status: INITIAL  2.  Pt to demo improved strength of L hip to be at least 4/5 to improve stability, stairs, and gait   Goal status: INITIAL  3.  Pt to report decreased pain in L hip to 0-2/10 with activity.   Goal status: INITIAL  4.  Pt to improve ability for stairs climbing without pain or deficit, with 1 UE support.   Goal status: INITIAL    PLAN:  PT FREQUENCY: 1-2x/week  PT DURATION: 8 weeks  PLANNED INTERVENTIONS: Therapeutic exercises, Therapeutic activity, Neuromuscular re-education, Patient/Family education, Self Care, Joint mobilization, Joint manipulation, Stair training, Orthotic/Fit training, DME instructions, Aquatic Therapy, Dry Needling, Electrical stimulation, Cryotherapy, Moist heat, Taping, Ultrasound, Ionotophoresis 4mg /ml Dexamethasone, Manual therapy,  Vasopneumatic device, Traction, Spinal manipulation, Spinal mobilization,Balance training, Gait training,   PLAN FOR NEXT SESSION:  hip strength, pelvic floor review with TA;    Sedalia Muta, PT, DPT 12:37  PM  12/15/23

## 2023-12-23 ENCOUNTER — Encounter: Payer: Self-pay | Admitting: Physical Therapy

## 2023-12-23 ENCOUNTER — Ambulatory Visit: Payer: Medicare Other | Admitting: Physical Therapy

## 2023-12-23 DIAGNOSIS — M62838 Other muscle spasm: Secondary | ICD-10-CM | POA: Diagnosis not present

## 2023-12-23 DIAGNOSIS — M25552 Pain in left hip: Secondary | ICD-10-CM | POA: Diagnosis not present

## 2023-12-23 DIAGNOSIS — M6281 Muscle weakness (generalized): Secondary | ICD-10-CM | POA: Diagnosis not present

## 2023-12-23 NOTE — Therapy (Addendum)
 OUTPATIENT PHYSICAL THERAPY LOWER EXTREMITY Treatment/Progress Note    Patient Name: Mackenzie Green MRN: [id number] DOB:06/08/1946, 77 y.o., female Today's Date: 12/23/2023  Physical Therapy Progress Note  Dates of Reporting Period: 11/13/23  to 12/23/23    END OF SESSION:  PT End of Session - 12/23/23 0934     Visit Number 10    Number of Visits 16    Date for PT Re-Evaluation 01/08/24    Authorization Type [employer] medicare,  PN done at visit 10    PT Start Time 0935    PT Stop Time 1015    PT Time Calculation (min) 40 min    Activity Tolerance Patient tolerated treatment well;No increased pain    Behavior During Therapy [facility] for tasks assessed/performed                  Past Medical History:  Diagnosis Date   Hypoglycemia 07/30/2012   Hypokalemia 07/31/2012   Hyponatremia 07/30/2012   Low iron    Mild cognitive impairment with memory loss    Mitral valve prolapse    Neutropenia (HCC) 08/27/2016   Past Surgical History:  Procedure Laterality Date   TEAR DUCT PROBING     TOTAL HIP ARTHROPLASTY Left 03/27/2022   Procedure: LEFT POSTERIOR TOTAL HIP ARTHROPLASTY;  Surgeon: Edna Toribio LABOR, MD;  Location: [facility] OR;  Service: Orthopedics;  Laterality: Left;   Patient Active Problem List   Diagnosis Date Noted   Age related osteoporosis 08/22/2023   Irritable bowel syndrome with diarrhea 08/22/2023   Hip fracture (HCC) 03/26/2022   Mild cognitive impairment with memory loss 03/26/2022   Spinal stenosis at L4-L5 level 03/26/2022    PCP: Corean Comment  REFERRING PROVIDER: Corean Comment  REFERRING DIAG: L hip pain  THERAPY DIAG:  Pain in left hip  Other muscle spasm  Muscle weakness (generalized)  Rationale for Evaluation and Treatment: Rehabilitation  ONSET DATE: April 2023   SUBJECTIVE:   SUBJECTIVE STATEMENT:  12/23/2023 Pt states decreasing pain in L hip. States pain in AM getting up and with initial walking has been better. Has  been doing HEP .  Eval: Pt had fall in April 2023, with hip fracture, and subsequent THA. She states good recovery after surgery, but did start having some bursitis type pain in the fall. She was having PT for this as well as pelvic floor. She stopped PT in September, and notes increased pain in the last couple months. She has stayed active walking and doing exercises, some gym machines and some classes - chair yoga.  She has newer diagnosis of Oseoporosis, currently not taking medication for treatment for this. She has 1 step to enter her home.  States pain in  hip, feels tight, stiff, knotted.    PERTINENT HISTORY: L THA  April 2023   PAIN:  Are you having pain? Yes: NPRS scale: 0-4 /10 Pain location: L hip Pain description: tight, stiff, knot Aggravating factors: walking, increased activity  Relieving factors: none stated   PRECAUTIONS: None  WEIGHT BEARING RESTRICTIONS: No  FALLS:  Has patient fallen in last 6 months? No  PLOF: Independent  PATIENT GOALS:  Decreased pain   NEXT MD VISIT:   OBJECTIVE: updated 12 31/24  DIAGNOSTIC FINDINGS:   PATIENT SURVEYS:    COGNITION: Overall cognitive status: Within functional limits for tasks assessed     SENSATION: WFL  EDEMA:   POSTURE:      PALPATION:     LOWER EXTREMITY ROM:  Lumbar:  WFL Hips: R: WFL,  L: mild limitation for ER, IR Knees: WFL  LOWER EXTREMITY MMT:  MMT Left eval Right  eval Left 2/31/24  Hip flexion 3 (noted IR position) 4+ 4-  Hip extension     Hip abduction 4-  4  Hip adduction     Hip internal rotation     Hip external rotation     Knee flexion 4 5 4   Knee extension 4- 4+ 4  Ankle dorsiflexion     Ankle plantarflexion     Ankle inversion     Ankle eversion      (Blank rows = not tested)  LOWER EXTREMITY SPECIAL TESTS:   GAIT:   TODAY'S TREATMENT:                                                                                                                               DATE:   12/23/2023 Therapeutic Exercise: Aerobic:   Supine:   bridging x 10;  S/L: hip abd on R x 15,  hip add on L 3 x 5;  Seated: sit to stand 2 x 10 no hands, regular chair;  Prone:  Standing:  Squats 2 x 10 at mat table, 5 lb  ; Hip abd and ext  2 x 10 bil;   Step ups 6 in, 1 ue light support,  x 12 bil;  Stretches:  Neuromuscular Re-education: Manual Therapy: Therapeutic Activity: Self Care:     Therapeutic Exercise: Aerobic:   Supine:    S/L:  (discussed continued Hip add in s/l for HEP)  Seated:  Prone:  Standing: marching/slow x 25;   Squats 2 x 10 at mat table ; Hip abd and ext  2 x 10 bil;  Step ups 6 in , 1 ue light support,  x 12 bil;  Stretches:  Neuromuscular Re-education: Manual Therapy: Therapeutic Activity: Self Care:    Previous Therapeutic Exercise: Aerobic:  Bike L1 x 4 min  Supine:   Bridges with ball squeeze  2x10;  S/L: Clam shells 2x10 L;   Seated: LAQ 2lb, 2 x 10;  Prone: ; Standing: Stretches:  Neuromuscular Re-education: S/L: hip abd x 15 on L, with pillow under L hip;  Prone: Hamstring curls, 4# L 15x3 Supine: SLR, L x15, with cueing for hip ER and maintaining knee ext Manual Therapy: Therapeutic Activity: Self Care:    Therapeutic Exercise: Aerobic:  Bike L1 x 6 min- education on optimal leg position;  Supine:   Bridges with ball squeeze  2x10  S/L: Clam shells 2x10 L;  hip abd x 15 on L;  trial for hip adduction in s/l- too painful to lay on L. Seated: LAQ 2lb, 2 x 10;  sit to stand/higher mat table with cueing for leg position;  Prone:  Standing: Stretches:  Neuromuscular Re-education: Manual Therapy: Therapeutic Activity: Self Care:    PATIENT EDUCATION:  Education details: updated and reviewed HEP Person educated: Patient Education  method: Explanation, Demonstration, Tactile cues, Verbal cues, and Handouts Education comprehension: verbalized understanding, returned demonstration, verbal cues required, tactile  cues required, and needs further education   HOME EXERCISE PROGRAM: Access Code: [email or web address]   ASSESSMENT:   CLINICAL IMPRESSION: 12/23/2023 PN: pt has been seen for 10 visits. Pt with improving ability for LE ther ex for strengthening. Improving LE mechanics with squats but still has valgus at knees with sit to stand. She has improving gait pattern with improving stability of hip. She has been able to progress to standing strengthening, Mod-max cueing for form, but improved form after education and practice today. She overall has improving pain and function in the last week, and is progressing well. Pt to benefit from continued care to meet LTGs   Eval: Session focused on ther ex to promote LE strengthening & NMRE to promote core re-ed required for standing & dynamic tasks. Pt was able to progress LAQ by increasing weight used, and completing sit to stands, deadbug, and bird dogs exercises. Pt able to perform sit to stands and required verbal and tactile cueing to promote proper knee positioning. Pt showed improvement with controlling bil knee medial translation with RTB around the knees. Pt continues to have difficulty with lumbopelvic control, medial knee translation during weightbearing, and hip drop secondary to lumbopelvic motor control & strength and bil LEs weakness -although steady progress is being made. Continue to promote lumbopelvic and LEs strength and motor control as tolerated.  Eval: Patient presents with primary complaint of  pain in L hip. She has significant weakness with testing today for anterior hip >lateral hip, which is likely contributing to her pain with activity. She will benefit from education on HEP and strengthening for hip, to improve mechanics and pain with standing and functional activities.Pt with decreased ability and tolerance for functional activities, IADLS, stairs, and walking. Pt will  benefit from skilled PT to improve deficits and pain and to return to PLOF.    OBJECTIVE IMPAIRMENTS: Abnormal gait, decreased activity tolerance, decreased mobility, decreased ROM, decreased strength, increased muscle spasms, improper body mechanics, and pain.   ACTIVITY LIMITATIONS: lifting, bending, standing, squatting, stairs, transfers, hygiene/grooming, and locomotion level  PARTICIPATION LIMITATIONS: cleaning, driving, and community activity  PERSONAL FACTORS: Past/current experiences and Time since onset of injury/illness/exacerbation are also affecting patient's functional outcome.   REHAB POTENTIAL: Good  CLINICAL DECISION MAKING: Stable/uncomplicated  EVALUATION COMPLEXITY: Low   GOALS: Goals reviewed with patient? Yes   SHORT TERM GOALS: Target date: 11/27/2023  Pt to be independent with initial HEP  Goal status: MET  2.  Pt to demo ability for SLR with optimal mechanics (strength to at least 3/5)   Goal status: MET   LONG TERM GOALS: Target date: 01/08/2024  Pt to be independent with final HEP  Goal status: In progress   2.  Pt to demo improved strength of L hip to be at least 4/5 to improve stability, stairs, and gait   Goal status: In progress   3.  Pt to report decreased pain in L hip to 0-2/10 with activity.   Goal status: In progress   4.  Pt to improve ability for stairs climbing without pain or deficit, with 1 UE support.   Goal status: In progress    PLAN:  PT FREQUENCY: 1-2x/week  PT DURATION: 8 weeks  PLANNED INTERVENTIONS: Therapeutic exercises, Therapeutic activity, Neuromuscular re-education, Patient/Family education, Self Care, Joint mobilization, Joint manipulation, Stair training, Orthotic/Fit training, DME instructions, Aquatic Therapy, Dry Needling, Electrical  stimulation, Cryotherapy, Moist heat, Taping, Ultrasound, Ionotophoresis 4mg /ml Dexamethasone, Manual therapy,  Vasopneumatic device, Traction, Spinal manipulation, Spinal mobilization,Balance training, Gait training,   PLAN FOR NEXT SESSION:  hip  strength, pelvic floor review with TA;    Tinnie Don, PT, DPT 4:02 PM  12/23/23

## 2023-12-26 ENCOUNTER — Encounter: Payer: Self-pay | Admitting: Physical Therapy

## 2023-12-26 ENCOUNTER — Ambulatory Visit: Payer: Medicare Other | Admitting: Physical Therapy

## 2023-12-26 DIAGNOSIS — M6281 Muscle weakness (generalized): Secondary | ICD-10-CM

## 2023-12-26 DIAGNOSIS — M62838 Other muscle spasm: Secondary | ICD-10-CM | POA: Diagnosis not present

## 2023-12-26 DIAGNOSIS — M25552 Pain in left hip: Secondary | ICD-10-CM

## 2023-12-26 NOTE — Therapy (Signed)
 OUTPATIENT PHYSICAL THERAPY LOWER EXTREMITY Treatment   Patient Name: Mackenzie Green MRN: [id number] DOB:1946/02/19, 78 y.o., female Today's Date: 12/26/2023    END OF SESSION:  PT End of Session - 12/26/23 1030     Visit Number 11    Number of Visits 16    Date for PT Re-Evaluation 01/08/24    Authorization Type [employer] medicare,  PN done at visit 10    PT Start Time 1022    PT Stop Time 1113    PT Time Calculation (min) 51 min    Activity Tolerance Patient tolerated treatment well;No increased pain    Behavior During Therapy [facility] for tasks assessed/performed                   Past Medical History:  Diagnosis Date   Hypoglycemia 07/30/2012   Hypokalemia 07/31/2012   Hyponatremia 07/30/2012   Low iron    Mild cognitive impairment with memory loss    Mitral valve prolapse    Neutropenia (HCC) 08/27/2016   Past Surgical History:  Procedure Laterality Date   TEAR DUCT PROBING     TOTAL HIP ARTHROPLASTY Left 03/27/2022   Procedure: LEFT POSTERIOR TOTAL HIP ARTHROPLASTY;  Surgeon: Edna Toribio LABOR, MD;  Location: [facility] OR;  Service: Orthopedics;  Laterality: Left;   Patient Active Problem List   Diagnosis Date Noted   Age related osteoporosis 08/22/2023   Irritable bowel syndrome with diarrhea 08/22/2023   Hip fracture (HCC) 03/26/2022   Mild cognitive impairment with memory loss 03/26/2022   Spinal stenosis at L4-L5 level 03/26/2022    PCP: Corean Comment  REFERRING PROVIDER: Corean Comment  REFERRING DIAG: L hip pain  THERAPY DIAG:  Pain in left hip  Other muscle spasm  Muscle weakness (generalized)  Rationale for Evaluation and Treatment: Rehabilitation  ONSET DATE: April 2023   SUBJECTIVE:   SUBJECTIVE STATEMENT:  12/26/2023 Pt states mild soreness in hip , thinks muscle soreness from exercising, and some soreness in AM.   Eval: Pt had fall in April 2023, with hip fracture, and subsequent THA. She states good recovery after surgery,  but did start having some bursitis type pain in the fall. She was having PT for this as well as pelvic floor. She stopped PT in September, and notes increased pain in the last couple months. She has stayed active walking and doing exercises, some gym machines and some classes - chair yoga.  She has newer diagnosis of Oseoporosis, currently not taking medication for treatment for this. She has 1 step to enter her home.  States pain in  hip, feels tight, stiff, knotted.    PERTINENT HISTORY: L THA  April 2023   PAIN:  Are you having pain? Yes: NPRS scale: 0-4 /10 Pain location: L hip Pain description: tight, stiff, knot Aggravating factors: walking, increased activity  Relieving factors: none stated   PRECAUTIONS: None  WEIGHT BEARING RESTRICTIONS: No  FALLS:  Has patient fallen in last 6 months? No  PLOF: Independent  PATIENT GOALS:  Decreased pain   NEXT MD VISIT:   OBJECTIVE: updated 12 31/24  DIAGNOSTIC FINDINGS:   PATIENT SURVEYS:    COGNITION: Overall cognitive status: Within functional limits for tasks assessed     SENSATION: WFL  EDEMA:   POSTURE:      PALPATION:     LOWER EXTREMITY ROM:  Lumbar: WFL Hips: R: WFL,  L: mild limitation for ER, IR Knees: WFL  LOWER EXTREMITY MMT:  MMT Left eval Right  eval Left 2/31/24  Hip flexion 3 (noted IR position) 4+ 4-  Hip extension     Hip abduction 4-  4  Hip adduction     Hip internal rotation     Hip external rotation     Knee flexion 4 5 4   Knee extension 4- 4+ 4  Ankle dorsiflexion     Ankle plantarflexion     Ankle inversion     Ankle eversion      (Blank rows = not tested)  LOWER EXTREMITY SPECIAL TESTS:   GAIT:   TODAY'S TREATMENT:                                                                                                                              DATE:   12/26/2023 Therapeutic Exercise: Aerobic: bike L2 x 6 min;  Supine:  Education and practice for achieving active TA  contraction, discussion on importance of pelvic floor and TA contraction together. X 20,  S/L: hip abd on R x 10,  hip add on L 3 x 5;  Seated: review of chair yoga hip exercises from class- questions answered on form.  Prone:  Standing:   Step ups 6 in, Fwd and Lat,  no UE support,  x 12 ea bil;  Stretches:  Neuromuscular Re-education: Manual Therapy: Therapeutic Activity: Self Care:  discussion on fecal incontinence, review of her current plan from MD notes, she is getting GI testing then recommended she follow back up with urogynecologist     Therapeutic Exercise: Aerobic:   Supine:    S/L:  (discussed continued Hip add in s/l for HEP)  Seated:  Prone:  Standing: marching/slow x 25;   Squats 2 x 10 at mat table ; Hip abd and ext  2 x 10 bil;  Step ups 6 in , 1 ue light support,  x 12 bil;  Stretches:  Neuromuscular Re-education: Manual Therapy: Therapeutic Activity: Self Care:    Previous Therapeutic Exercise: Aerobic:  Bike L1 x 4 min  Supine:   Bridges with ball squeeze  2x10;  S/L: Clam shells 2x10 L;   Seated: LAQ 2lb, 2 x 10;  Prone: ; Standing: Stretches:  Neuromuscular Re-education: S/L: hip abd x 15 on L, with pillow under L hip;  Prone: Hamstring curls, 4# L 15x3 Supine: SLR, L x15, with cueing for hip ER and maintaining knee ext Manual Therapy: Therapeutic Activity: Self Care:    Therapeutic Exercise: Aerobic:  Bike L1 x 6 min- education on optimal leg position;  Supine:   Bridges with ball squeeze  2x10  S/L: Clam shells 2x10 L;  hip abd x 15 on L;  trial for hip adduction in s/l- too painful to lay on L. Seated: LAQ 2lb, 2 x 10;  sit to stand/higher mat table with cueing for leg position;  Prone:  Standing: Stretches:  Neuromuscular Re-education: Manual Therapy: Therapeutic Activity: Self Care:    PATIENT EDUCATION:  Education details:  updated and reviewed HEP Person educated: Patient Education method: Explanation, Demonstration, Tactile  cues, Verbal cues, and Handouts Education comprehension: verbalized understanding, returned demonstration, verbal cues required, tactile cues required, and needs further education   HOME EXERCISE PROGRAM: Access Code: [email or web address]   ASSESSMENT:   CLINICAL IMPRESSION: 12/26/2023 Pt with improving ability for hip strength and ther ex. Discussion and review of pelvic floor and TA contraction. Pt attended pelvic floor PT but has not been actively contracting abdominals. Education and practice for TA contraction today, pt with good ability to perform, but will benefit from ongoing education and practice for this as well as pelvic floor muscles. Plan to progress as tolerated.   Eval: Session focused on ther ex to promote LE strengthening & NMRE to promote core re-ed required for standing & dynamic tasks. Pt was able to progress LAQ by increasing weight used, and completing sit to stands, deadbug, and bird dogs exercises. Pt able to perform sit to stands and required verbal and tactile cueing to promote proper knee positioning. Pt showed improvement with controlling bil knee medial translation with RTB around the knees. Pt continues to have difficulty with lumbopelvic control, medial knee translation during weightbearing, and hip drop secondary to lumbopelvic motor control & strength and bil LEs weakness -although steady progress is being made. Continue to promote lumbopelvic and LEs strength and motor control as tolerated.  Eval: Patient presents with primary complaint of  pain in L hip. She has significant weakness with testing today for anterior hip >lateral hip, which is likely contributing to her pain with activity. She will benefit from education on HEP and strengthening for hip, to improve mechanics and pain with standing and functional activities.Pt with decreased ability and tolerance for functional activities, IADLS, stairs, and walking. Pt will  benefit from skilled PT to improve deficits and pain and  to return to PLOF.   OBJECTIVE IMPAIRMENTS: Abnormal gait, decreased activity tolerance, decreased mobility, decreased ROM, decreased strength, increased muscle spasms, improper body mechanics, and pain.   ACTIVITY LIMITATIONS: lifting, bending, standing, squatting, stairs, transfers, hygiene/grooming, and locomotion level  PARTICIPATION LIMITATIONS: cleaning, driving, and community activity  PERSONAL FACTORS: Past/current experiences and Time since onset of injury/illness/exacerbation are also affecting patient's functional outcome.   REHAB POTENTIAL: Good  CLINICAL DECISION MAKING: Stable/uncomplicated  EVALUATION COMPLEXITY: Low   GOALS: Goals reviewed with patient? Yes   SHORT TERM GOALS: Target date: 11/27/2023  Pt to be independent with initial HEP  Goal status: MET  2.  Pt to demo ability for SLR with optimal mechanics (strength to at least 3/5)   Goal status: MET   LONG TERM GOALS: Target date: 01/08/2024  Pt to be independent with final HEP  Goal status: In progress   2.  Pt to demo improved strength of L hip to be at least 4/5 to improve stability, stairs, and gait   Goal status: In progress   3.  Pt to report decreased pain in L hip to 0-2/10 with activity.   Goal status: In progress   4.  Pt to improve ability for stairs climbing without pain or deficit, with 1 UE support.   Goal status: In progress    PLAN:  PT FREQUENCY: 1-2x/week  PT DURATION: 8 weeks  PLANNED INTERVENTIONS: Therapeutic exercises, Therapeutic activity, Neuromuscular re-education, Patient/Family education, Self Care, Joint mobilization, Joint manipulation, Stair training, Orthotic/Fit training, DME instructions, Aquatic Therapy, Dry Needling, Electrical stimulation, Cryotherapy, Moist heat, Taping, Ultrasound, Ionotophoresis 4mg /ml Dexamethasone, Manual therapy,  Vasopneumatic device, Traction,  Spinal manipulation, Spinal mobilization,Balance training, Gait training,   PLAN FOR  NEXT SESSION:  hip strength, pelvic floor review with TA;    Tinnie Don, PT, DPT 11:20 AM  12/26/23

## 2023-12-30 ENCOUNTER — Encounter: Payer: Medicare Other | Admitting: Physical Therapy

## 2024-01-02 ENCOUNTER — Ambulatory Visit: Payer: Medicare Other | Admitting: Physical Therapy

## 2024-01-02 ENCOUNTER — Encounter: Payer: Self-pay | Admitting: Physical Therapy

## 2024-01-02 DIAGNOSIS — M6281 Muscle weakness (generalized): Secondary | ICD-10-CM | POA: Diagnosis not present

## 2024-01-02 DIAGNOSIS — M25552 Pain in left hip: Secondary | ICD-10-CM

## 2024-01-02 DIAGNOSIS — M62838 Other muscle spasm: Secondary | ICD-10-CM

## 2024-01-02 DIAGNOSIS — R293 Abnormal posture: Secondary | ICD-10-CM | POA: Diagnosis not present

## 2024-01-02 NOTE — Therapy (Signed)
 OUTPATIENT PHYSICAL THERAPY LOWER EXTREMITY Treatment   Patient Name: Mackenzie Green MRN: [id number] DOB:[birth year], 78 y.o., female Today's Date: 01/02/2024    END OF SESSION:  PT End of Session - 01/02/24 0932     Visit Number 12    Number of Visits 16    Date for PT Re-Evaluation 01/08/24    Authorization Type [employer] medicare,  PN done at visit 10    PT Start Time 0933    PT Stop Time 1015    PT Time Calculation (min) 42 min    Activity Tolerance Patient tolerated treatment well;No increased pain    Behavior During Therapy [facility] for tasks assessed/performed                   Past Medical History:  Diagnosis Date   Hypoglycemia 07/30/2012   Hypokalemia 07/31/2012   Hyponatremia 07/30/2012   Low iron    Mild cognitive impairment with memory loss    Mitral valve prolapse    Neutropenia (HCC) 08/27/2016   Past Surgical History:  Procedure Laterality Date   TEAR DUCT PROBING     TOTAL HIP ARTHROPLASTY Left 03/27/2022   Procedure: LEFT POSTERIOR TOTAL HIP ARTHROPLASTY;  Surgeon: Edna Toribio LABOR, MD;  Location: [facility] OR;  Service: Orthopedics;  Laterality: Left;   Patient Active Problem List   Diagnosis Date Noted   Age related osteoporosis 08/22/2023   Irritable bowel syndrome with diarrhea 08/22/2023   Hip fracture (HCC) 03/26/2022   Mild cognitive impairment with memory loss 03/26/2022   Spinal stenosis at L4-L5 level 03/26/2022    PCP: Corean Comment  REFERRING PROVIDER: Corean Comment  REFERRING DIAG: L hip pain  THERAPY DIAG:  Pain in left hip  Other muscle spasm  Muscle weakness (generalized)  Abnormal posture  Rationale for Evaluation and Treatment: Rehabilitation  ONSET DATE: April 2023   SUBJECTIVE:   SUBJECTIVE STATEMENT:  01/02/2024 Pt states less pain in hip overall. She has been having bothersome GI issues.   Eval: Pt had fall in April 2023, with hip fracture, and subsequent THA. She states good recovery after surgery,  but did start having some bursitis type pain in the fall. She was having PT for this as well as pelvic floor. She stopped PT in September, and notes increased pain in the last couple months. She has stayed active walking and doing exercises, some gym machines and some classes - chair yoga.  She has newer diagnosis of Oseoporosis, currently not taking medication for treatment for this. She has 1 step to enter her home.  States pain in  hip, feels tight, stiff, knotted.    PERTINENT HISTORY: L THA  April 2023   PAIN:  Are you having pain? Yes: NPRS scale: 0-4 /10 Pain location: L hip Pain description: tight, stiff, knot Aggravating factors: walking, increased activity  Relieving factors: none stated   PRECAUTIONS: None  WEIGHT BEARING RESTRICTIONS: No  FALLS:  Has patient fallen in last 6 months? No  PLOF: Independent  PATIENT GOALS:  Decreased pain   NEXT MD VISIT:   OBJECTIVE: updated 12 31/24  DIAGNOSTIC FINDINGS:   PATIENT SURVEYS:    COGNITION: Overall cognitive status: Within functional limits for tasks assessed     SENSATION: WFL  EDEMA:   POSTURE:      PALPATION:     LOWER EXTREMITY ROM:  Lumbar: WFL Hips: R: WFL,  L: mild limitation for ER, IR Knees: WFL  LOWER EXTREMITY MMT:  MMT Left eval Right  eval Left 2/31/24  Hip flexion 3 (noted IR position) 4+ 4-  Hip extension     Hip abduction 4-  4  Hip adduction     Hip internal rotation     Hip external rotation     Knee flexion 4 5 4   Knee extension 4- 4+ 4  Ankle dorsiflexion     Ankle plantarflexion     Ankle inversion     Ankle eversion      (Blank rows = not tested)  LOWER EXTREMITY SPECIAL TESTS:   GAIT:   TODAY'S TREATMENT:                                                                                                                              DATE:   01/02/2024 Therapeutic Exercise: Aerobic: bike L2 x 8 min;  Supine:  Education and practice for achieving active TA  contraction, discussion on importance of pelvic floor and TA contraction together. X 20,  SLR with TA 2 x 10;  S/L:  Seated:  Prone:  Standing:   Step ups 6 in, Lateral,  1 UE support,  x 12 ea bil;   Carrys 8 lb x 2 each; band walks RTB 25 ft x 6;  Stretches:  Neuromuscular Re-education: Manual Therapy: Therapeutic Activity: Self Care:  discussion on fecal incontinence, review of her current plan from MD notes, she is getting GI testing then recommended she follow back up with urogynecologist.    Therapeutic Exercise: Aerobic:   Supine:    S/L:  (discussed continued Hip add in s/l for HEP)  Seated:  Prone:  Standing: marching/slow x 25;   Squats 2 x 10 at mat table ; Hip abd and ext  2 x 10 bil;  Step ups 6 in , 1 ue light support,  x 12 bil;  Stretches:  Neuromuscular Re-education: Manual Therapy: Therapeutic Activity: Self Care:    Previous Therapeutic Exercise: Aerobic:  Bike L1 x 4 min  Supine:   Bridges with ball squeeze  2x10;  S/L: Clam shells 2x10 L;   Seated: LAQ 2lb, 2 x 10;  Prone: ; Standing: Stretches:  Neuromuscular Re-education: S/L: hip abd x 15 on L, with pillow under L hip;  Prone: Hamstring curls, 4# L 15x3 Supine: SLR, L x15, with cueing for hip ER and maintaining knee ext Manual Therapy: Therapeutic Activity: Self Care:    Therapeutic Exercise: Aerobic:  Bike L1 x 6 min- education on optimal leg position;  Supine:   Bridges with ball squeeze  2x10  S/L: Clam shells 2x10 L;  hip abd x 15 on L;  trial for hip adduction in s/l- too painful to lay on L. Seated: LAQ 2lb, 2 x 10;  sit to stand/higher mat table with cueing for leg position;  Prone:  Standing: Stretches:  Neuromuscular Re-education: Manual Therapy: Therapeutic Activity: Self Care:    PATIENT EDUCATION:  Education details: updated and reviewed HEP Person educated: Patient  Education method: Explanation, Demonstration, Tactile cues, Verbal cues, and Handouts Education  comprehension: verbalized understanding, returned demonstration, verbal cues required, tactile cues required, and needs further education   HOME EXERCISE PROGRAM: Access Code: [email or web address]   ASSESSMENT:   CLINICAL IMPRESSION: 01/02/2024 Pt with improving ability for hip strength and ther ex. Improved hip and LE mechanics with LE strengthening today. Pt to benefit from continued strength and stabilization.   Eval: Session focused on ther ex to promote LE strengthening & NMRE to promote core re-ed required for standing & dynamic tasks. Pt was able to progress LAQ by increasing weight used, and completing sit to stands, deadbug, and bird dogs exercises. Pt able to perform sit to stands and required verbal and tactile cueing to promote proper knee positioning. Pt showed improvement with controlling bil knee medial translation with RTB around the knees. Pt continues to have difficulty with lumbopelvic control, medial knee translation during weightbearing, and hip drop secondary to lumbopelvic motor control & strength and bil LEs weakness -although steady progress is being made. Continue to promote lumbopelvic and LEs strength and motor control as tolerated.  Eval: Patient presents with primary complaint of  pain in L hip. She has significant weakness with testing today for anterior hip >lateral hip, which is likely contributing to her pain with activity. She will benefit from education on HEP and strengthening for hip, to improve mechanics and pain with standing and functional activities.Pt with decreased ability and tolerance for functional activities, IADLS, stairs, and walking. Pt will  benefit from skilled PT to improve deficits and pain and to return to PLOF.   OBJECTIVE IMPAIRMENTS: Abnormal gait, decreased activity tolerance, decreased mobility, decreased ROM, decreased strength, increased muscle spasms, improper body mechanics, and pain.   ACTIVITY LIMITATIONS: lifting, bending, standing, squatting,  stairs, transfers, hygiene/grooming, and locomotion level  PARTICIPATION LIMITATIONS: cleaning, driving, and community activity  PERSONAL FACTORS: Past/current experiences and Time since onset of injury/illness/exacerbation are also affecting patient's functional outcome.   REHAB POTENTIAL: Good  CLINICAL DECISION MAKING: Stable/uncomplicated  EVALUATION COMPLEXITY: Low   GOALS: Goals reviewed with patient? Yes   SHORT TERM GOALS: Target date: 11/27/2023  Pt to be independent with initial HEP  Goal status: MET  2.  Pt to demo ability for SLR with optimal mechanics (strength to at least 3/5)   Goal status: MET   LONG TERM GOALS: Target date: 01/08/2024  Pt to be independent with final HEP  Goal status: In progress   2.  Pt to demo improved strength of L hip to be at least 4/5 to improve stability, stairs, and gait   Goal status: In progress   3.  Pt to report decreased pain in L hip to 0-2/10 with activity.   Goal status: In progress   4.  Pt to improve ability for stairs climbing without pain or deficit, with 1 UE support.   Goal status: In progress    PLAN:  PT FREQUENCY: 1-2x/week  PT DURATION: 8 weeks  PLANNED INTERVENTIONS: Therapeutic exercises, Therapeutic activity, Neuromuscular re-education, Patient/Family education, Self Care, Joint mobilization, Joint manipulation, Stair training, Orthotic/Fit training, DME instructions, Aquatic Therapy, Dry Needling, Electrical stimulation, Cryotherapy, Moist heat, Taping, Ultrasound, Ionotophoresis 4mg /ml Dexamethasone, Manual therapy,  Vasopneumatic device, Traction, Spinal manipulation, Spinal mobilization,Balance training, Gait training,   PLAN FOR NEXT SESSION:  hip strength, pelvic floor review with TA;    Tinnie Don, PT, DPT 9:32 AM  01/02/24

## 2024-01-06 ENCOUNTER — Encounter: Payer: Medicare Other | Admitting: Physical Therapy

## 2024-01-08 ENCOUNTER — Encounter: Payer: Self-pay | Admitting: Physical Therapy

## 2024-01-08 ENCOUNTER — Ambulatory Visit: Payer: Medicare Other | Admitting: Physical Therapy

## 2024-01-08 DIAGNOSIS — M6281 Muscle weakness (generalized): Secondary | ICD-10-CM | POA: Diagnosis not present

## 2024-01-08 DIAGNOSIS — M25552 Pain in left hip: Secondary | ICD-10-CM | POA: Diagnosis not present

## 2024-01-08 DIAGNOSIS — M62838 Other muscle spasm: Secondary | ICD-10-CM | POA: Diagnosis not present

## 2024-01-08 NOTE — Therapy (Signed)
OUTPATIENT PHYSICAL THERAPY LOWER EXTREMITY Treatment/Re-Cert    Patient Name: Mackenzie Green MRN: [id number] DOB:1946/08/19, 78 y.o., female Today's Date: 01/08/2024    END OF SESSION:  PT End of Session - 01/08/24 0939     Visit Number 13    Number of Visits 28    Date for PT Re-Evaluation 02/19/24    Authorization Type [employer] medicare,  PN done at visit 10, Re-cert done at visit 13.    PT Start Time [phone]    PT Stop Time 1016    PT Time Calculation (min) 43 min    Activity Tolerance Patient tolerated treatment well;No increased pain    Behavior During Therapy [facility] for tasks assessed/performed                    Past Medical History:  Diagnosis Date   Hypoglycemia 07/30/2012   Hypokalemia 07/31/2012   Hyponatremia 07/30/2012   Low iron    Mild cognitive impairment with memory loss    Mitral valve prolapse    Neutropenia (HCC) 08/27/2016   Past Surgical History:  Procedure Laterality Date   TEAR DUCT PROBING     TOTAL HIP ARTHROPLASTY Left 03/27/2022   Procedure: LEFT POSTERIOR TOTAL HIP ARTHROPLASTY;  Surgeon: Joen Laura, MD;  Location: [facility] OR;  Service: Orthopedics;  Laterality: Left;   Patient Active Problem List   Diagnosis Date Noted   Age related osteoporosis 08/22/2023   Irritable bowel syndrome with diarrhea 08/22/2023   Hip fracture (HCC) 03/26/2022   Mild cognitive impairment with memory loss 03/26/2022   Spinal stenosis at L4-L5 level 03/26/2022    PCP: Dulce Sellar  REFERRING PROVIDER: Dulce Sellar  REFERRING DIAG: L hip pain  THERAPY DIAG:  Pain in left hip  Other muscle spasm  Muscle weakness (generalized)  Rationale for Evaluation and Treatment: Rehabilitation  ONSET DATE: April 2023   SUBJECTIVE:   SUBJECTIVE STATEMENT:  01/08/2024 Pt states less pain in hip overall. Feels she is getting stronger.   Eval: Pt had fall in April 2023, with hip fracture, and subsequent THA. She states good recovery after  surgery, but did start having some bursitis type pain in the fall. She was having PT for this as well as pelvic floor. She stopped PT in September, and notes increased pain in the last couple months. She has stayed active walking and doing exercises, some gym machines and some classes - chair yoga.  She has newer diagnosis of Oseoporosis, currently not taking medication for treatment for this. She has 1 step to enter her home.  States pain in  hip, feels tight, stiff, knotted.    PERTINENT HISTORY: L THA  April 2023   PAIN:  Are you having pain? Yes: NPRS scale: 0-3 /10 Pain location: L hip Pain description: tight,  sore Aggravating factors: walking, increased activity  Relieving factors: none stated   PRECAUTIONS: None  WEIGHT BEARING RESTRICTIONS: No  FALLS:  Has patient fallen in last 6 months? No  PLOF: Independent  PATIENT GOALS:  Decreased pain   NEXT MD VISIT:   OBJECTIVE: updated 01/08/24  DIAGNOSTIC FINDINGS:   PATIENT SURVEYS:    COGNITION: Overall cognitive status: Within functional limits for tasks assessed     SENSATION: WFL  EDEMA:   POSTURE:      PALPATION:     LOWER EXTREMITY ROM:  Lumbar: WFL Hips: R: WFL,  L: mild limitation for ER, IR Knees: WFL  LOWER EXTREMITY MMT:  MMT Left eval  Right  eval Left 12/23/23 Left 01/08/24  Hip flexion 3 (noted IR position) 4+ 4- 4-  Hip extension      Hip abduction 4-  4 4  Hip adduction      Hip internal rotation      Hip external rotation      Knee flexion 4 5 4  4+  Knee extension 4- 4+ 4 4+  Ankle dorsiflexion      Ankle plantarflexion      Ankle inversion      Ankle eversion       (Blank rows = not tested)  LOWER EXTREMITY SPECIAL TESTS:   GAIT:   TODAY'S TREATMENT:                                                                                                                              DATE:   01/08/2024 Therapeutic Exercise: Aerobic:  Supine:  bridging 2 x 10 with education  on mechanics.  S/L:  Seated:  Prone:  Standing:   Step ups 6 in, Lateral,  1 UE support,  x 12 ea bil;  crossover step up with L (for adductors x 10);   Carrys 10 lb x 2 each;   band walks RTB 25 ft x 6;   Marching/slow x 25 Stretches: supine, piriformis- Hip IR across body to opp shoulder 30 sec x 3 ; (review next visit)  Neuromuscular Re-education: Manual Therapy: Therapeutic Activity: Self Care:    Previous: Therapeutic Exercise: Aerobic:   Supine:    S/L:  (discussed continued Hip add in s/l for HEP)  Seated:  Prone:  Standing: marching/slow x 25;   Squats 2 x 10 at mat table ; Hip abd and ext  2 x 10 bil;  Step ups 6 in , 1 ue light support,  x 12 bil;  Stretches:  Neuromuscular Re-education: Manual Therapy: Therapeutic Activity: Self Care:    Therapeutic Exercise: Aerobic:  Bike L1 x 4 min  Supine:   Bridges with ball squeeze  2x10;  S/L: Clam shells 2x10 L;   Seated: LAQ 2lb, 2 x 10;  Prone: "; Standing: Stretches:  Neuromuscular Re-education: S/L: hip abd x 15 on L, with pillow under L hip;  Prone: Hamstring curls, 4# L 15x3" Supine: SLR, L x15, with cueing for hip ER and maintaining knee ext Manual Therapy: Therapeutic Activity: Self Care:    Therapeutic Exercise: Aerobic:  Bike L1 x 6 min- education on optimal leg position;  Supine:   Bridges with ball squeeze  2x10  S/L: Clam shells 2x10 L;  hip abd x 15 on L;  trial for hip adduction in s/l- too painful to lay on L. Seated: LAQ 2lb, 2 x 10;  sit to stand/higher mat table with cueing for leg position;  Prone:  Standing: Stretches:  Neuromuscular Re-education: Manual Therapy: Therapeutic Activity: Self Care:    PATIENT EDUCATION:  Education details: updated and reviewed HEP Person educated: Patient Education  method: Explanation, Demonstration, Tactile cues, Verbal cues, and Handouts Education comprehension: verbalized understanding, returned demonstration, verbal cues required, tactile cues  required, and needs further education   HOME EXERCISE PROGRAM: Access Code: [email or web address]   ASSESSMENT:   CLINICAL IMPRESSION: 01/08/2024 Pt making good progress. She has much less pain in hip since eval. She is showing good strength improvements, with less hip drop in standing/sls phases, as well as improving LE mechanics and less hip IR compensation.  She has improving ability and tolerance for hip strength and ther ex.She continues to have strength deficits in L LE, that will require continued care for PT. She requires mod/max cuing for optimal mechanics with LE activities. She also has significant weakness for pelvic floor and will benefit from continued education on strength for this while strengthening core and hips. She has had long history of pain in L hip since THA, and is finally starting to show progress for strength and pain, but will require continued care to fully meet LTGs and strength goals. She has longer than expected time frame for recovery due to longstanding pain and deficits.    Eval: Patient presents with primary complaint of  pain in L hip. She has significant weakness with testing today for anterior hip >lateral hip, which is likely contributing to her pain with activity. She will benefit from education on HEP and strengthening for hip, to improve mechanics and pain with standing and functional activities.Pt with decreased ability and tolerance for functional activities, IADLS, stairs, and walking. Pt will  benefit from skilled PT to improve deficits and pain and to return to PLOF.   OBJECTIVE IMPAIRMENTS: Abnormal gait, decreased activity tolerance, decreased mobility, decreased ROM, decreased strength, increased muscle spasms, improper body mechanics, and pain.   ACTIVITY LIMITATIONS: lifting, bending, standing, squatting, stairs, transfers, hygiene/grooming, and locomotion level  PARTICIPATION LIMITATIONS: cleaning, driving, and community activity  PERSONAL FACTORS:  Past/current experiences and Time since onset of injury/illness/exacerbation are also affecting patient's functional outcome.   REHAB POTENTIAL: Good  CLINICAL DECISION MAKING: Stable/uncomplicated  EVALUATION COMPLEXITY: Low   GOALS: Goals reviewed with patient? Yes   SHORT TERM GOALS: Target date: 11/27/2023  Pt to be independent with initial HEP  Goal status: MET  2.  Pt to demo ability for SLR with optimal mechanics (strength to at least 3/5)   Goal status: MET   LONG TERM GOALS: Target date: 02/19/2024  Pt to be independent with final HEP  Goal status: In progress   2.  Pt to demo improved strength of L hip to be at least 4/5 to improve stability, stairs, and gait   Goal status: In progress   3.  Pt to report decreased pain in L hip to 0-2/10 with activity.   Goal status: In progress   4.  Pt to improve ability for stairs climbing without pain or deficit, with 1 UE support.   Goal status: In progress    PLAN:  PT FREQUENCY: 1-2x/week  PT DURATION: 6 weeks  PLANNED INTERVENTIONS: Therapeutic exercises, Therapeutic activity, Neuromuscular re-education, Patient/Family education, Self Care, Joint mobilization, Joint manipulation, Stair training, Orthotic/Fit training, DME instructions, Aquatic Therapy, Dry Needling, Electrical stimulation, Cryotherapy, Moist heat, Taping, Ultrasound, Ionotophoresis 4mg /ml Dexamethasone, Manual therapy,  Vasopneumatic device, Traction, Spinal manipulation, Spinal mobilization,Balance training, Gait training,   PLAN FOR NEXT SESSION:   continue L hip and LE strength, review any core and pelvic floor strength   Sedalia Muta, PT, DPT 2:15 PM  01/08/24

## 2024-01-13 ENCOUNTER — Encounter: Payer: Self-pay | Admitting: Obstetrics and Gynecology

## 2024-01-20 ENCOUNTER — Ambulatory Visit (INDEPENDENT_AMBULATORY_CARE_PROVIDER_SITE_OTHER): Payer: Medicare Other | Admitting: Physical Therapy

## 2024-01-20 ENCOUNTER — Encounter: Payer: Self-pay | Admitting: Physical Therapy

## 2024-01-20 DIAGNOSIS — M6281 Muscle weakness (generalized): Secondary | ICD-10-CM

## 2024-01-20 DIAGNOSIS — M62838 Other muscle spasm: Secondary | ICD-10-CM

## 2024-01-20 DIAGNOSIS — M25552 Pain in left hip: Secondary | ICD-10-CM | POA: Diagnosis not present

## 2024-01-20 NOTE — Patient Instructions (Signed)

## 2024-01-20 NOTE — Therapy (Signed)
OUTPATIENT PHYSICAL THERAPY LOWER EXTREMITY Treatment   Patient Name: Mackenzie Green MRN: [id number] DOB:1946-07-17, 78 y.o., female Today's Date: 01/20/2024    END OF SESSION:  PT End of Session - 01/20/24 0940     Visit Number 14    Number of Visits 28    Date for PT Re-Evaluation 02/19/24    Authorization Type [employer] medicare,  PN done at visit 10, Re-cert done at visit 13.    Authorization - Visit Number 1    Authorization - Number of Visits 6    PT Start Time 0940    PT Stop Time 1028    PT Time Calculation (min) 48 min    Activity Tolerance Patient tolerated treatment well;No increased pain    Behavior During Therapy [facility] for tasks assessed/performed                     Past Medical History:  Diagnosis Date   Hypoglycemia 07/30/2012   Hypokalemia 07/31/2012   Hyponatremia 07/30/2012   Low iron    Mild cognitive impairment with memory loss    Mitral valve prolapse    Neutropenia (HCC) 08/27/2016   Past Surgical History:  Procedure Laterality Date   TEAR DUCT PROBING     TOTAL HIP ARTHROPLASTY Left 03/27/2022   Procedure: LEFT POSTERIOR TOTAL HIP ARTHROPLASTY;  Surgeon: Joen Laura, MD;  Location: [facility] OR;  Service: Orthopedics;  Laterality: Left;   Patient Active Problem List   Diagnosis Date Noted   Age related osteoporosis 08/22/2023   Irritable bowel syndrome with diarrhea 08/22/2023   Hip fracture (HCC) 03/26/2022   Mild cognitive impairment with memory loss 03/26/2022   Spinal stenosis at L4-L5 level 03/26/2022    PCP: Dulce Sellar  REFERRING PROVIDER: Dulce Sellar  REFERRING DIAG: L hip pain  THERAPY DIAG:  Pain in left hip  Other muscle spasm  Muscle weakness (generalized)  Rationale for Evaluation and Treatment: Rehabilitation  ONSET DATE: April 2023   SUBJECTIVE:   SUBJECTIVE STATEMENT:  01/20/2024 Pt states less pain in hip overall. Feels she is getting stronger. Still notes soreness/tightness in  posterior hip/glute  Eval: Pt had fall in April 2023, with hip fracture, and subsequent THA. She states good recovery after surgery, but did start having some bursitis type pain in the fall. She was having PT for this as well as pelvic floor. She stopped PT in September, and notes increased pain in the last couple months. She has stayed active walking and doing exercises, some gym machines and some classes - chair yoga.  She has newer diagnosis of Oseoporosis, currently not taking medication for treatment for this. She has 1 step to enter her home.  States pain in  hip, feels tight, stiff, knotted.    PERTINENT HISTORY: L THA  April 2023   PAIN:  Are you having pain? Yes: NPRS scale: 0-3 /10 Pain location: L hip Pain description: tight,  sore Aggravating factors: walking, increased activity  Relieving factors: none stated   PRECAUTIONS: None  WEIGHT BEARING RESTRICTIONS: No  FALLS:  Has patient fallen in last 6 months? No  PLOF: Independent  PATIENT GOALS:  Decreased pain   NEXT MD VISIT:   OBJECTIVE: updated 01/08/24  DIAGNOSTIC FINDINGS:   PATIENT SURVEYS:    COGNITION: Overall cognitive status: Within functional limits for tasks assessed     SENSATION: WFL  EDEMA:   POSTURE:      PALPATION:     LOWER EXTREMITY ROM:  Lumbar: WFL Hips: R: WFL,  L: mild limitation for ER, IR Knees: WFL  LOWER EXTREMITY MMT:  MMT Left eval Right  eval Left 12/23/23 Left 01/08/24  Hip flexion 3 (noted IR position) 4+ 4- 4-  Hip extension      Hip abduction 4-  4 4  Hip adduction      Hip internal rotation      Hip external rotation      Knee flexion 4 5 4  4+  Knee extension 4- 4+ 4 4+  Ankle dorsiflexion      Ankle plantarflexion      Ankle inversion      Ankle eversion       (Blank rows = not tested)  LOWER EXTREMITY SPECIAL TESTS:   GAIT:   TODAY'S TREATMENT:                                                                                                                               DATE:   01/20/2024 Therapeutic Exercise: Aerobic:  Supine:  bridging 2 x 10 with ball squeeze  Seated:  Prone:  Standing:    Stretches:  piriformis fig 4 30 sec x 3;  Hip IR across body to opp shoulder 30 sec x 3 ; reviewed both for HEP- added to HEP Neuromuscular Re-education: Manual Therapy: DTM to L piriformis and glute med Therapeutic Activity: Modalities: moist heat pack to L glute x 10 min at end of session.  Self Care: education on self massage for L glute with tennis ball, use of heat and stretching for muscle tension in L glute  Trigger Point Dry Needling  Initial Treatment: Pt instructed on Dry Needling rational, procedures, and possible side effects. Pt instructed to expect mild to moderate muscle soreness later in the day and/or into the next day.  Pt instructed in methods to reduce muscle soreness. Pt instructed to continue prescribed HEP. Patient was educated on signs and symptoms of infection and other risk factors and advised to seek medical attention should they occur.  Patient verbalized understanding of these instructions and education.   Patient Verbal Consent Given: Yes Education Handout Provided: Yes Muscles Treated: L piriformis Electrical Stimulation Performed: No Treatment Response/Outcome: palpable increase in muscle length     Therapeutic Exercise: Aerobic:  Supine:  bridging 2 x 10 with education on mechanics.  S/L:  Seated:  Prone:  Standing:   Step ups 6 in, Lateral,  1 UE support,  x 12 ea bil;  crossover step up with L (for adductors x 10);   Carrys 10 lb x 2 each;   band walks RTB 25 ft x 6;   Marching/slow x 25 Stretches: supine, piriformis- Hip IR across body to opp shoulder 30 sec x 3 ; (review next visit)  Neuromuscular Re-education: Manual Therapy: Therapeutic Activity: Self Care:     PATIENT EDUCATION:  Education details: updated and reviewed HEP Person educated: Patient Education method:  Explanation, Demonstration,  Tactile cues, Verbal cues, and Handouts Education comprehension: verbalized understanding, returned demonstration, verbal cues required, tactile cues required, and needs further education   HOME EXERCISE PROGRAM: Access Code: [email or web address]   ASSESSMENT:   CLINICAL IMPRESSION: 01/20/2024 Focus today for education on reducing muscle tension in glute. She has muscle tightness and soreness that continues to be bothersome. Trial for dry needling today for this. Educated on stretches for piriformis, as well as use of heat, and self massage to the area.    Eval: Patient presents with primary complaint of  pain in L hip. She has significant weakness with testing today for anterior hip >lateral hip, which is likely contributing to her pain with activity. She will benefit from education on HEP and strengthening for hip, to improve mechanics and pain with standing and functional activities.Pt with decreased ability and tolerance for functional activities, IADLS, stairs, and walking. Pt will  benefit from skilled PT to improve deficits and pain and to return to PLOF.   OBJECTIVE IMPAIRMENTS: Abnormal gait, decreased activity tolerance, decreased mobility, decreased ROM, decreased strength, increased muscle spasms, improper body mechanics, and pain.   ACTIVITY LIMITATIONS: lifting, bending, standing, squatting, stairs, transfers, hygiene/grooming, and locomotion level  PARTICIPATION LIMITATIONS: cleaning, driving, and community activity  PERSONAL FACTORS: Past/current experiences and Time since onset of injury/illness/exacerbation are also affecting patient's functional outcome.   REHAB POTENTIAL: Good  CLINICAL DECISION MAKING: Stable/uncomplicated  EVALUATION COMPLEXITY: Low   GOALS: Goals reviewed with patient? Yes   SHORT TERM GOALS: Target date: 11/27/2023  Pt to be independent with initial HEP  Goal status: MET  2.  Pt to demo ability for SLR with optimal  mechanics (strength to at least 3/5)   Goal status: MET   LONG TERM GOALS: Target date: 02/19/2024  Pt to be independent with final HEP  Goal status: In progress   2.  Pt to demo improved strength of L hip to be at least 4/5 to improve stability, stairs, and gait   Goal status: In progress   3.  Pt to report decreased pain in L hip to 0-2/10 with activity.   Goal status: In progress   4.  Pt to improve ability for stairs climbing without pain or deficit, with 1 UE support.   Goal status: In progress    PLAN:  PT FREQUENCY: 1-2x/week  PT DURATION: 6 weeks  PLANNED INTERVENTIONS: Therapeutic exercises, Therapeutic activity, Neuromuscular re-education, Patient/Family education, Self Care, Joint mobilization, Joint manipulation, Stair training, Orthotic/Fit training, DME instructions, Aquatic Therapy, Dry Needling, Electrical stimulation, Cryotherapy, Moist heat, Taping, Ultrasound, Ionotophoresis 4mg /ml Dexamethasone, Manual therapy,  Vasopneumatic device, Traction, Spinal manipulation, Spinal mobilization,Balance training, Gait training,   PLAN FOR NEXT SESSION:     Sedalia Muta, PT, DPT 1:13 PM  01/20/24

## 2024-01-22 ENCOUNTER — Ambulatory Visit: Payer: Medicare Other | Admitting: Physical Therapy

## 2024-01-22 ENCOUNTER — Encounter: Payer: Self-pay | Admitting: Physical Therapy

## 2024-01-22 DIAGNOSIS — M62838 Other muscle spasm: Secondary | ICD-10-CM | POA: Diagnosis not present

## 2024-01-22 DIAGNOSIS — M25552 Pain in left hip: Secondary | ICD-10-CM | POA: Diagnosis not present

## 2024-01-22 DIAGNOSIS — M6281 Muscle weakness (generalized): Secondary | ICD-10-CM

## 2024-01-22 NOTE — Therapy (Signed)
OUTPATIENT PHYSICAL THERAPY LOWER EXTREMITY Treatment   Patient Name: Mackenzie Green MRN: [id number] DOB:[birth year], 78 y.o., female Today's Date: 01/22/2024    END OF SESSION:  PT End of Session - 01/22/24 0934     Visit Number 15    Number of Visits 28    Date for PT Re-Evaluation 02/19/24    Authorization Type [employer] medicare,  PN done at visit 10, Re-cert done at visit 13.    Authorization - Visit Number 2    Authorization - Number of Visits 6    PT Start Time 0935    PT Stop Time 1013    PT Time Calculation (min) 38 min    Activity Tolerance Patient tolerated treatment well;No increased pain    Behavior During Therapy [facility] for tasks assessed/performed                     Past Medical History:  Diagnosis Date   Hypoglycemia 07/30/2012   Hypokalemia 07/31/2012   Hyponatremia 07/30/2012   Low iron    Mild cognitive impairment with memory loss    Mitral valve prolapse    Neutropenia (HCC) 08/27/2016   Past Surgical History:  Procedure Laterality Date   TEAR DUCT PROBING     TOTAL HIP ARTHROPLASTY Left 03/27/2022   Procedure: LEFT POSTERIOR TOTAL HIP ARTHROPLASTY;  Surgeon: Joen Laura, MD;  Location: [facility] OR;  Service: Orthopedics;  Laterality: Left;   Patient Active Problem List   Diagnosis Date Noted   Age related osteoporosis 08/22/2023   Irritable bowel syndrome with diarrhea 08/22/2023   Hip fracture (HCC) 03/26/2022   Mild cognitive impairment with memory loss 03/26/2022   Spinal stenosis at L4-L5 level 03/26/2022    PCP: Dulce Sellar  REFERRING PROVIDER: Dulce Sellar  REFERRING DIAG: L hip pain  THERAPY DIAG:  Pain in left hip  Other muscle spasm  Muscle weakness (generalized)  Rationale for Evaluation and Treatment: Rehabilitation  ONSET DATE: April 2023   SUBJECTIVE:   SUBJECTIVE STATEMENT:  01/22/2024 Pt states hip sore, but also did lot of walking, was able to walk 1.5 miles with walking group for the first  time in a long time.    Eval: Pt had fall in April 2023, with hip fracture, and subsequent THA. She states good recovery after surgery, but did start having some bursitis type pain in the fall. She was having PT for this as well as pelvic floor. She stopped PT in September, and notes increased pain in the last couple months. She has stayed active walking and doing exercises, some gym machines and some classes - chair yoga.  She has newer diagnosis of Oseoporosis, currently not taking medication for treatment for this. She has 1 step to enter her home.  States pain in  hip, feels tight, stiff, knotted.    PERTINENT HISTORY: L THA  April 2023   PAIN:  Are you having pain? Yes: NPRS scale: 0-3 /10 Pain location: L hip Pain description: tight,  sore Aggravating factors: walking, increased activity  Relieving factors: none stated   PRECAUTIONS: None  WEIGHT BEARING RESTRICTIONS: No  FALLS:  Has patient fallen in last 6 months? No  PLOF: Independent  PATIENT GOALS:  Decreased pain   NEXT MD VISIT:   OBJECTIVE: updated 01/08/24  DIAGNOSTIC FINDINGS:   PATIENT SURVEYS:    COGNITION: Overall cognitive status: Within functional limits for tasks assessed     SENSATION: WFL  EDEMA:   POSTURE:  PALPATION:     LOWER EXTREMITY ROM:  Lumbar: WFL Hips: R: WFL,  L: mild limitation for ER, IR Knees: WFL  LOWER EXTREMITY MMT:  MMT Left eval Right  eval Left 12/23/23 Left 01/08/24  Hip flexion 3 (noted IR position) 4+ 4- 4-  Hip extension      Hip abduction 4-  4 4  Hip adduction      Hip internal rotation      Hip external rotation      Knee flexion 4 5 4  4+  Knee extension 4- 4+ 4 4+  Ankle dorsiflexion      Ankle plantarflexion      Ankle inversion      Ankle eversion       (Blank rows = not tested)  LOWER EXTREMITY SPECIAL TESTS:   GAIT:   TODAY'S TREATMENT:                                                                                                                               DATE:   01/22/2024 Therapeutic Exercise: Aerobic:  Supine:   Seated:  S/L: clams 2 x 10 Standing:    Stretches:  piriformis fig 4 30 sec x 3;  Hip IR across body to opp shoulder 30 sec x 3 ; reviewed both for HEP, hip ER fallouts x 15, 3 sec holds  Neuromuscular Re-education: Manual Therapy: DTM to L piriformis and glute med, manual ROM for hip flex, IR, ER,  Therapeutic Activity: Modalities: moist heat pack to L glute x 10 min at end of session.  Self Care:  Trigger Point Dry Needling   Therapeutic Exercise: Aerobic:  Supine:  bridging 2 x 10 with education on mechanics.  S/L:  Seated:  Prone:  Standing:   Step ups 6 in, Lateral,  1 UE support,  x 12 ea bil;  crossover step up with L (for adductors x 10);   Carrys 10 lb x 2 each;   band walks RTB 25 ft x 6;   Marching/slow x 25 Stretches: supine, piriformis- Hip IR across body to opp shoulder 30 sec x 3 ; (review next visit)  Neuromuscular Re-education: Manual Therapy: Therapeutic Activity: Self Care:     PATIENT EDUCATION:  Education details: updated and reviewed HEP Person educated: Patient Education method: Explanation, Demonstration, Tactile cues, Verbal cues, and Handouts Education comprehension: verbalized understanding, returned demonstration, verbal cues required, tactile cues required, and needs further education   HOME EXERCISE PROGRAM: Access Code: [email or web address]   ASSESSMENT:   CLINICAL IMPRESSION: 01/22/2024 Pt with improving strength and function overall. Focus for pain and muscle tension relief for R glute today. Discussed continuing walking, but every other day or 3rd day, and at decreased milage with a progressive build.    Eval: Patient presents with primary complaint of  pain in L hip. She has significant weakness with testing today for anterior hip >lateral hip, which is likely contributing to her pain with  activity. She will benefit from education on HEP and  strengthening for hip, to improve mechanics and pain with standing and functional activities.Pt with decreased ability and tolerance for functional activities, IADLS, stairs, and walking. Pt will  benefit from skilled PT to improve deficits and pain and to return to PLOF.   OBJECTIVE IMPAIRMENTS: Abnormal gait, decreased activity tolerance, decreased mobility, decreased ROM, decreased strength, increased muscle spasms, improper body mechanics, and pain.   ACTIVITY LIMITATIONS: lifting, bending, standing, squatting, stairs, transfers, hygiene/grooming, and locomotion level  PARTICIPATION LIMITATIONS: cleaning, driving, and community activity  PERSONAL FACTORS: Past/current experiences and Time since onset of injury/illness/exacerbation are also affecting patient's functional outcome.   REHAB POTENTIAL: Good  CLINICAL DECISION MAKING: Stable/uncomplicated  EVALUATION COMPLEXITY: Low   GOALS: Goals reviewed with patient? Yes   SHORT TERM GOALS: Target date: 11/27/2023  Pt to be independent with initial HEP  Goal status: MET  2.  Pt to demo ability for SLR with optimal mechanics (strength to at least 3/5)   Goal status: MET   LONG TERM GOALS: Target date: 02/19/2024  Pt to be independent with final HEP  Goal status: In progress   2.  Pt to demo improved strength of L hip to be at least 4/5 to improve stability, stairs, and gait   Goal status: In progress   3.  Pt to report decreased pain in L hip to 0-2/10 with activity.   Goal status: In progress   4.  Pt to improve ability for stairs climbing without pain or deficit, with 1 UE support.   Goal status: In progress    PLAN:  PT FREQUENCY: 1-2x/week  PT DURATION: 6 weeks  PLANNED INTERVENTIONS: Therapeutic exercises, Therapeutic activity, Neuromuscular re-education, Patient/Family education, Self Care, Joint mobilization, Joint manipulation, Stair training, Orthotic/Fit training, DME instructions, Aquatic Therapy, Dry  Needling, Electrical stimulation, Cryotherapy, Moist heat, Taping, Ultrasound, Ionotophoresis 4mg /ml Dexamethasone, Manual therapy,  Vasopneumatic device, Traction, Spinal manipulation, Spinal mobilization,Balance training, Gait training,   PLAN FOR NEXT SESSION:     Sedalia Muta, PT, DPT 10:13 AM  01/22/24

## 2024-01-28 DIAGNOSIS — R197 Diarrhea, unspecified: Secondary | ICD-10-CM | POA: Diagnosis not present

## 2024-01-28 DIAGNOSIS — K638211 Small intestinal bacterial overgrowth, hydrogen-subtype: Secondary | ICD-10-CM | POA: Diagnosis not present

## 2024-01-28 DIAGNOSIS — K63829 Intestinal methanogen overgrowth, unspecified: Secondary | ICD-10-CM | POA: Diagnosis not present

## 2024-01-29 ENCOUNTER — Encounter: Payer: Self-pay | Admitting: Physical Therapy

## 2024-01-29 ENCOUNTER — Ambulatory Visit: Payer: Medicare Other | Admitting: Physical Therapy

## 2024-01-29 DIAGNOSIS — M6281 Muscle weakness (generalized): Secondary | ICD-10-CM

## 2024-01-29 DIAGNOSIS — M25552 Pain in left hip: Secondary | ICD-10-CM

## 2024-01-29 DIAGNOSIS — M62838 Other muscle spasm: Secondary | ICD-10-CM

## 2024-01-29 NOTE — Therapy (Signed)
 OUTPATIENT PHYSICAL THERAPY LOWER EXTREMITY Treatment   Patient Name: Mackenzie Green MRN: [id number] DOB:1946/09/28, 78 y.o., female Today's Date: 01/29/2024    END OF SESSION:  PT End of Session - 01/29/24 0932     Visit Number 16    Number of Visits 28    Date for PT Re-Evaluation 02/19/24    Authorization Type [employer] medicare,  PN done at visit 10, Re-cert done at visit 13.    Authorization - Visit Number 3    Authorization - Number of Visits 6    PT Start Time 0935    PT Stop Time 1016    PT Time Calculation (min) 41 min    Activity Tolerance Patient tolerated treatment well;No increased pain    Behavior During Therapy [facility] for tasks assessed/performed                     Past Medical History:  Diagnosis Date   Hypoglycemia 07/30/2012   Hypokalemia 07/31/2012   Hyponatremia 07/30/2012   Low iron    Mild cognitive impairment with memory loss    Mitral valve prolapse    Neutropenia (HCC) 08/27/2016   Past Surgical History:  Procedure Laterality Date   TEAR DUCT PROBING     TOTAL HIP ARTHROPLASTY Left 03/27/2022   Procedure: LEFT POSTERIOR TOTAL HIP ARTHROPLASTY;  Surgeon: Edna Toribio LABOR, MD;  Location: [facility] OR;  Service: Orthopedics;  Laterality: Left;   Patient Active Problem List   Diagnosis Date Noted   Age related osteoporosis 08/22/2023   Irritable bowel syndrome with diarrhea 08/22/2023   Hip fracture (HCC) 03/26/2022   Mild cognitive impairment with memory loss 03/26/2022   Spinal stenosis at L4-L5 level 03/26/2022    PCP: Corean Comment  REFERRING PROVIDER: Corean Comment  REFERRING DIAG: L hip pain  THERAPY DIAG:  Pain in left hip  Other muscle spasm  Muscle weakness (generalized)  Rationale for Evaluation and Treatment: Rehabilitation  ONSET DATE: April 2023   SUBJECTIVE:   SUBJECTIVE STATEMENT:  01/29/2024 Pt states hip not painful, but she feels it on lateral hip. She does not have pain like she did at previous  visit. She states the 1.5 mile walk she did was actually 3.5 miles.   Eval: Pt had fall in April 2023, with hip fracture, and subsequent THA. She states good recovery after surgery, but did start having some bursitis type pain in the fall. She was having PT for this as well as pelvic floor. She stopped PT in September, and notes increased pain in the last couple months. She has stayed active walking and doing exercises, some gym machines and some classes - chair yoga.  She has newer diagnosis of Oseoporosis, currently not taking medication for treatment for this. She has 1 step to enter her home.  States pain in  hip, feels tight, stiff, knotted.    PERTINENT HISTORY: L THA  April 2023   PAIN:  Are you having pain? Yes: NPRS scale: 0-3 /10 Pain location: L hip Pain description: tight,  sore Aggravating factors: walking, increased activity  Relieving factors: none stated   PRECAUTIONS: None  WEIGHT BEARING RESTRICTIONS: No  FALLS:  Has patient fallen in last 6 months? No  PLOF: Independent  PATIENT GOALS:  Decreased pain   NEXT MD VISIT:   OBJECTIVE: updated 01/08/24  DIAGNOSTIC FINDINGS:   PATIENT SURVEYS:    COGNITION: Overall cognitive status: Within functional limits for tasks assessed     SENSATION: [facility]  EDEMA:   POSTURE:      PALPATION:     LOWER EXTREMITY ROM:  Lumbar: WFL Hips: R: WFL,  L: mild limitation for ER, IR Knees: WFL  LOWER EXTREMITY MMT:  MMT Left eval Right  eval Left 12/23/23 Left 01/08/24  Hip flexion 3 (noted IR position) 4+ 4- 4-  Hip extension      Hip abduction 4-  4 4  Hip adduction      Hip internal rotation      Hip external rotation      Knee flexion 4 5 4  4+  Knee extension 4- 4+ 4 4+  Ankle dorsiflexion      Ankle plantarflexion      Ankle inversion      Ankle eversion       (Blank rows = not tested)  LOWER EXTREMITY SPECIAL TESTS:   GAIT:   TODAY'S TREATMENT:                                                                                                                               DATE:   01/29/2024 Therapeutic Exercise: Aerobic:  Supine:   Seated:  sit to stand 2 x 10  S/L: clams 2 x 10; hip abd 2 x 10;  Standing:   fwd step ups with knee drive x 89/dont bil;  Stretches:  piriformis fig 4 30 sec x 3 bil;  Hip IR across body to opp shoulder 30 sec x 3 ; , hip ER fallouts x 15, 3 sec holds ; reviewed these for HEP and to do in AM when feeling stiff  Neuromuscular Re-education: Manual Therapy:  DTM to L lateral/posterior hip, roller to ITB. Therapeutic Activity: Modalities:  Self Care:  Trigger Point Dry Needling    Therapeutic Exercise: Aerobic:  Supine:   Seated:  S/L: clams 2 x 10 Standing:    Stretches:  piriformis fig 4 30 sec x 3 bil;  Hip IR across body to opp shoulder 30 sec x 3 ; reviewed both for HEP, hip ER fallouts x 15, 3 sec holds  Neuromuscular Re-education: Manual Therapy:  DTM to L piriformis and glute med, manual ROM for hip flex, IR, ER   Therapeutic Activity: Modalities: moist heat pack to L glute x 10 min at end of session.  Self Care:  Trigger Point Dry Needling   Therapeutic Exercise: Aerobic:  Supine:  bridging 2 x 10 with education on mechanics.  S/L:  Seated:  Prone:  Standing:   Step ups 6 in, Lateral,  1 UE support,  x 12 ea bil;  crossover step up with L (for adductors x 10);   Carrys 10 lb x 2 each;   band walks RTB 25 ft x 6;   Marching/slow x 25 Stretches: supine, piriformis- Hip IR across body to opp shoulder 30 sec x 3 ; (review next visit)  Neuromuscular Re-education: Manual Therapy: Therapeutic Activity: Self Care:  PATIENT EDUCATION:  Education details: updated and reviewed HEP Person educated: Patient Education method: Explanation, Demonstration, Tactile cues, Verbal cues, and Handouts Education comprehension: verbalized understanding, returned demonstration, verbal cues required, tactile cues required, and needs further  education   HOME EXERCISE PROGRAM: Access Code: [email or web address]   ASSESSMENT:   CLINICAL IMPRESSION: 01/29/2024 Education today on not over doing walking mileage or time, and progressing very slow with this. Discussed need to continue hip mobility especially in AM if feeling stiff, and continued focus on hip strength for stability in standing position. Discussed starting walking program with 10-15 min, every 3rd day. Pt with much less/no muscle tenderness in glute today. She does have pain on back side of trochanter as well as into ITB today. Pt to benefit from continued care.    Eval: Patient presents with primary complaint of  pain in L hip. She has significant weakness with testing today for anterior hip >lateral hip, which is likely contributing to her pain with activity. She will benefit from education on HEP and strengthening for hip, to improve mechanics and pain with standing and functional activities.Pt with decreased ability and tolerance for functional activities, IADLS, stairs, and walking. Pt will  benefit from skilled PT to improve deficits and pain and to return to PLOF.   OBJECTIVE IMPAIRMENTS: Abnormal gait, decreased activity tolerance, decreased mobility, decreased ROM, decreased strength, increased muscle spasms, improper body mechanics, and pain.   ACTIVITY LIMITATIONS: lifting, bending, standing, squatting, stairs, transfers, hygiene/grooming, and locomotion level  PARTICIPATION LIMITATIONS: cleaning, driving, and community activity  PERSONAL FACTORS: Past/current experiences and Time since onset of injury/illness/exacerbation are also affecting patient's functional outcome.   REHAB POTENTIAL: Good  CLINICAL DECISION MAKING: Stable/uncomplicated  EVALUATION COMPLEXITY: Low   GOALS: Goals reviewed with patient? Yes   SHORT TERM GOALS: Target date: 11/27/2023  Pt to be independent with initial HEP  Goal status: MET  2.  Pt to demo ability for SLR with optimal  mechanics (strength to at least 3/5)   Goal status: MET   LONG TERM GOALS: Target date: 02/19/2024  Pt to be independent with final HEP  Goal status: In progress   2.  Pt to demo improved strength of L hip to be at least 4/5 to improve stability, stairs, and gait   Goal status: In progress   3.  Pt to report decreased pain in L hip to 0-2/10 with activity.   Goal status: In progress   4.  Pt to improve ability for stairs climbing without pain or deficit, with 1 UE support.   Goal status: In progress    PLAN:  PT FREQUENCY: 1-2x/week  PT DURATION: 6 weeks  PLANNED INTERVENTIONS: Therapeutic exercises, Therapeutic activity, Neuromuscular re-education, Patient/Family education, Self Care, Joint mobilization, Joint manipulation, Stair training, Orthotic/Fit training, DME instructions, Aquatic Therapy, Dry Needling, Electrical stimulation, Cryotherapy, Moist heat, Taping, Ultrasound, Ionotophoresis 4mg /ml Dexamethasone, Manual therapy,  Vasopneumatic device, Traction, Spinal manipulation, Spinal mobilization,Balance training, Gait training,   PLAN FOR NEXT SESSION:     Tinnie Don, PT, DPT 10:25 AM  01/29/24

## 2024-02-02 ENCOUNTER — Ambulatory Visit: Payer: Medicare Other

## 2024-02-02 ENCOUNTER — Other Ambulatory Visit (HOSPITAL_COMMUNITY)
Admission: RE | Admit: 2024-02-02 | Discharge: 2024-02-02 | Disposition: A | Discharge status: Home / Self Care | Payer: Medicare Other | Source: Ambulatory Visit | Attending: Obstetrics | Admitting: Obstetrics

## 2024-02-02 ENCOUNTER — Encounter: Payer: Medicare Other | Admitting: Physical Therapy

## 2024-02-02 VITALS — BP 101/51 | HR 72

## 2024-02-02 DIAGNOSIS — R350 Frequency of micturition: Secondary | ICD-10-CM | POA: Diagnosis not present

## 2024-02-02 DIAGNOSIS — R319 Hematuria, unspecified: Secondary | ICD-10-CM | POA: Diagnosis present

## 2024-02-02 DIAGNOSIS — R82998 Other abnormal findings in urine: Secondary | ICD-10-CM | POA: Diagnosis not present

## 2024-02-02 LAB — POCT URINALYSIS DIPSTICK
Bilirubin, UA: NEGATIVE
Glucose, UA: NEGATIVE
Ketones, UA: NEGATIVE
Nitrite, UA: NEGATIVE
Protein, UA: POSITIVE — AB
Spec Grav, UA: 1.015 (ref 1.010–1.025)
Urobilinogen, UA: 0.2 U/dL
pH, UA: 7 (ref 5.0–8.0)

## 2024-02-02 LAB — URINALYSIS, ROUTINE W REFLEX MICROSCOPIC
Bilirubin Urine: NEGATIVE
Glucose, UA: NEGATIVE mg/dL
Ketones, ur: NEGATIVE mg/dL
Nitrite: NEGATIVE
Protein, ur: 30 mg/dL — AB
Specific Gravity, Urine: 1.012 (ref 1.005–1.030)
WBC, UA: >50 WBC/hpf (ref 0–5)
pH: 6 (ref 5.0–8.0)

## 2024-02-02 NOTE — Patient Instructions (Addendum)
Your Urine dip that was done in office was Positive. I am sending the urine off for culture and you can take AZO over the counter for your discomfort.  We will contact you when the results are back between 3-5 days. If a antibiotic is needed we will sent the order to the pharmacy and you will be notified. If you have any questions or concerns please feel free to call us at [phone]

## 2024-02-02 NOTE — Progress Notes (Signed)
Mackenzie Green is a 78 y.o. female  arrived today with UTI sx.  Per Dr. Jari Favre protocol: A urine specimen was collected and POCT Urine was done and urine culture sent to the lab. POCT Urine was positive

## 2024-02-04 ENCOUNTER — Encounter: Payer: Medicare Other | Admitting: Physical Therapy

## 2024-02-04 ENCOUNTER — Encounter: Payer: Self-pay | Admitting: Obstetrics and Gynecology

## 2024-02-04 ENCOUNTER — Other Ambulatory Visit: Payer: Self-pay | Admitting: Obstetrics and Gynecology

## 2024-02-04 LAB — URINE CULTURE: Culture: 80000 — AB

## 2024-02-04 MED ORDER — SULFAMETHOXAZOLE-TRIMETHOPRIM 800-160 MG PO TABS: 1.0000 | ORAL_TABLET | Freq: Two times a day (BID) | ORAL | 0 refills | Status: AC

## 2024-02-05 ENCOUNTER — Encounter: Payer: Self-pay | Admitting: Family

## 2024-02-05 ENCOUNTER — Other Ambulatory Visit (HOSPITAL_COMMUNITY)
Admission: RE | Admit: 2024-02-05 | Discharge: 2024-02-05 | Disposition: A | Discharge status: Home / Self Care | Payer: Medicare Other | Source: Ambulatory Visit | Attending: Obstetrics and Gynecology | Admitting: Obstetrics and Gynecology

## 2024-02-05 ENCOUNTER — Ambulatory Visit (INDEPENDENT_AMBULATORY_CARE_PROVIDER_SITE_OTHER): Payer: Medicare Other | Admitting: Family

## 2024-02-05 ENCOUNTER — Ambulatory Visit: Payer: Medicare Other | Admitting: Obstetrics and Gynecology

## 2024-02-05 VITALS — BP 101/65 | HR 73

## 2024-02-05 VITALS — BP 106/66 | HR 71 | Temp 97.6°F | Ht 65.0 in | Wt 106.0 lb

## 2024-02-05 DIAGNOSIS — K580 Irritable bowel syndrome with diarrhea: Secondary | ICD-10-CM | POA: Diagnosis not present

## 2024-02-05 DIAGNOSIS — N3000 Acute cystitis without hematuria: Secondary | ICD-10-CM | POA: Diagnosis not present

## 2024-02-05 DIAGNOSIS — N898 Other specified noninflammatory disorders of vagina: Secondary | ICD-10-CM

## 2024-02-05 DIAGNOSIS — N8189 Other female genital prolapse: Secondary | ICD-10-CM

## 2024-02-05 DIAGNOSIS — N812 Incomplete uterovaginal prolapse: Secondary | ICD-10-CM | POA: Diagnosis not present

## 2024-02-05 NOTE — Progress Notes (Signed)
Patient ID: Mackenzie Green, female    DOB: 1946/07/24, 78 y.o.   MRN: [id number]  Chief Complaint  Patient presents with   Irritable bowel syndrome with Diarrhea       Discussed the use of AI scribe software for clinical note transcription with the patient, who gave verbal consent to proceed.  History of Present Illness   Mackenzie Green is a 78 year old female with IBS and SIBO who presents to discuss her IBS and recent visit with GI provider.  She was prescribed Colestid by a [facility] in [place], to be taken three times a day. Initially, she felt ok, but her symptoms worsened, leading to severe diarrhea, especially at night, waking her up multiple times. She decided to stop the medication after experiencing these side effects and noted improvement in her symptoms since discontinuing it. She underwent a hydrogen breath test, which was positive, indicating SIBO. Rifaximin was prescribed for treatment, but insurance approval is pending, and she has not started this medication yet, and has questions about whether it is even needed. She has a history of infrequent bowel movements, sometimes going three to four days without one, which she considers normal, but at times also suffers from fecal incontinence and diarrhea. She experienced a bowel accident on a hiking trail recently, which she attributes to overexertion, but otherwise reports normal bowel movements recently. She has been managing her symptoms with lifestyle changes, including meditation, reducing coffee intake, and using peppermint oil with meals. She reports feeling better with these adjustments. She has been advised to undergo further testing, including an MRI of the pelvis and anorectal manometry, but has not yet decided whether to proceed with these tests due to concerns about stress and logistics of traveling.     Assessment & Plan:     Irritable Bowel Syndrome (IBS)/SIBO - Patient reports improvement in symptoms since  discontinuing Colestid. No current diarrhea or fecal leakage. 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 Rifaximin prescribed but not yet approved by insurance. Patient is considering holding off on treatment as symptoms are currently manageable. Reviewed GI office notes, labs, results. -Recommend holding off on Rifaximin as currently without any diarrhea. -Recommend the MRI of pelvis if no expensive copay, but may can have this done in [place] for convenience. -Recommend the anorectal manometry test scheduled for May, as this could be helpful in determining cause of sx. -Continue current management strategies including meditation, dietary modifications, and peppermint oil use. -Recommend trying to see GI in town thru same [facility] for convenience. Offered referral to [place] also if needed.  Urinary Tract Infection (UTI) - Recent UTI, currently  treating with antibiotics. Patient initially tried to manage symptoms with increased water intake but sought treatment due to persistent pain. -Encourage patient to seek medical attention promptly if UTI symptoms recur.  Pelvic Floor Weakness  - Patient is currently undergoing pelvic floor therapy and reports improvement. Anorectal manometry scheduled for May. -Continue pelvic floor therapy.     Subjective:    Outpatient Medications Prior to Visit  Medication Sig Dispense Refill   CALCIUM CITRATE PO Take by mouth.     Cholecalciferol (VITAMIN D-3 PO) Take 40 mcg by mouth 2 (two) times daily.     cholestyramine (QUESTRAN) 4 g packet Take 4 g by mouth as needed (Diarrhea).     estradiol (ESTRACE) 0.1 MG/GM vaginal cream Place 1 Applicatorful vaginally 2 (two) times a week.     Ferrous  Sulfate (IRON) 325 (65 Fe) MG TABS 1 tablet Orally Once a day     Lubricants (K-Y JELLY EX) Apply topically.     MAGNESIUM CITRATE PO Take 250 mg by mouth 2 (two) times daily.     MAGNESIUM GLYCINATE PO  Take 350 mg by mouth daily.     OVER THE COUNTER MEDICATION Mature multi     OVER THE COUNTER MEDICATION 10 g. Collagen peptides     OVER THE COUNTER MEDICATION [employer] Blocker milk (Turmeric), 1 tsp in oat milk     OVER THE COUNTER MEDICATION 2 (two) times daily. [place] Defend     OVER THE COUNTER MEDICATION Pro-Bifida 50+ ( probiotic)     OVER THE COUNTER MEDICATION daily. Wellness formula (immune support)     PEPPERMINT OIL PO Take by mouth as needed (digestion). Softgels     sulfamethoxazole-trimethoprim (BACTRIM DS) 800-160 MG tablet Take 1 tablet by mouth 2 (two) times daily for 3 days. 6 tablet 0   vitamin C (ASCORBIC ACID) 500 MG tablet Take 1,000 mg by mouth daily.     vitamin k 100 MCG tablet Take 100 mcg by mouth daily.     Menaquinone-7 (VITAMIN K2) 100 MCG CAPS      Multiple Vitamin (MULTIVITAMIN ADULT) TABS Take 1 tablet by mouth daily.     peppermint oil liquid by Does not apply route as needed.     Probiotic Product (PROBIOTIC PO) Take by mouth.     VITAMIN D PO Take 1,000 Units by mouth daily.     No facility-administered medications prior to visit.   Past Medical History:  Diagnosis Date   Hypoglycemia 07/30/2012   Hypokalemia 07/31/2012   Hyponatremia 07/30/2012   Low iron    Mild cognitive impairment with memory loss    Mitral valve prolapse    Neutropenia (HCC) 08/27/2016   Past Surgical History:  Procedure Laterality Date   TEAR DUCT PROBING     TOTAL HIP ARTHROPLASTY Left 03/27/2022   Procedure: LEFT POSTERIOR TOTAL HIP ARTHROPLASTY;  Surgeon: Joen Laura, MD;  Location: [facility] OR;  Service: Orthopedics;  Laterality: Left;   Allergies  Allergen Reactions   Lactose Intolerance (Gi)     Milk    Macrobid [Nitrofurantoin] Rash      Objective:    Physical Exam Vitals and nursing note reviewed.  Constitutional:      Appearance: Normal appearance.  Cardiovascular:     Rate and Rhythm: Normal rate and regular rhythm.  Pulmonary:     Effort: Pulmonary  effort is normal.     Breath sounds: Normal breath sounds.  Musculoskeletal:        General: Normal range of motion.  Skin:    General: Skin is warm and dry.  Neurological:     Mental Status: She is alert.  Psychiatric:        Mood and Affect: Mood normal.        Behavior: Behavior normal.    BP 106/66 (BP Location: Left Arm, Patient Position: Sitting, Cuff Size: Normal)   Pulse 71   Temp 97.6 F (36.4 C) (Temporal)   Ht 5\' 5"  (1.651 m)   Wt 106 lb (48.1 kg)   SpO2 98%   BMI 17.64 kg/m  Wt Readings from Last 3 Encounters:  02/05/24 106 lb (48.1 kg)  11/26/23 109 lb 12.8 oz (49.8 kg)  11/10/23 108 lb 3.2 oz (49.1 kg)      Dulce Sellar, NP

## 2024-02-05 NOTE — Progress Notes (Signed)
[facility]   Subjective:     Chief Complaint:  Chief Complaint  Patient presents with   Follow-up    Mackenzie Green is a 78 y.o. female is here for follow up.   History of Present Illness: Mackenzie Green is a 78 y.o. female with stage III pelvic organ prolapse who presents for a pessary check. She is using a size #1 cube pessary. The pessary has been left out while she was dealing with the BV and UTI. She is using vaginal estrogen. She denies vaginal bleeding. Reports she did >2 weeks of boric acid suppositories for BV diagnosis back in December. Patient then was diagnosed with a UTI this week.   Patient also reports she was recently diagnosed with SIBO.   Past Medical History: Patient  has a past medical history of Hypoglycemia (07/30/2012), Hypokalemia (07/31/2012), Hyponatremia (07/30/2012), Low iron, Mild cognitive impairment with memory loss, Mitral valve prolapse, and Neutropenia (HCC) (08/27/2016).   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   Medications: She has a current medication list which includes the following prescription(s): calcium citrate, cholecalciferol, cholestyramine, estradiol, iron, lubricants, magnesium citrate, magnesium glycinate, OVER THE COUNTER MEDICATION, OVER THE COUNTER MEDICATION, OVER THE COUNTER MEDICATION, OVER THE COUNTER MEDICATION, OVER THE COUNTER MEDICATION, OVER THE COUNTER MEDICATION, peppermint oil, sulfamethoxazole-trimethoprim, ascorbic acid, and vitamin k.   Allergies: Patient is allergic to lactose intolerance (gi) and macrobid [nitrofurantoin].   Social History: Patient  reports that she has never smoked. She has never used smokeless tobacco. She reports current alcohol use. She reports that she does not use drugs.      Objective:    Physical Exam: BP 101/65   Pulse 73  Gen: No apparent distress, A&O x 3. Detailed  Urogynecologic Evaluation:  Pelvic Exam: Normal external female genitalia; Bartholin's and Skene's glands normal in appearance; urethral meatus with caruncle, no urethral masses or discharge. The pessary was noted to be dislodged. It was removed and cleaned. Speculum exam revealed no lesions in the vagina. The pessary was replaced. It was comfortable to the patient and fit well.    Susceptibility data from last 90 days. Collected Specimen Info Organism AMPICILLIN AMPICILLIN/SULBACTAM CEFAZOLIN CEFEPIME CEFTRIAXONE Ciprofloxacin Gentamicin Susc lslt Imipenem Nitrofurantoin Susc lslt Piperacillin + Tazobactam Trimethoprim/Sulfa  02/02/24 Urine, Random Escherichia coli  S  S  S  S  S  S  S  S  S  S  S     Assessment/Plan:    Assessment: Mackenzie Green is a 78 y.o. with stage III pelvic organ prolapse here for a pessary check. She is doing well.  Plan: She will remove once a week . She will continue to use estrogen. She will follow-up in 3 months for a pessary check or sooner as needed.   Patient reports she is doing well on antibiotics for UTI.   Repeat aptima swab obtained to check for BV.

## 2024-02-05 NOTE — Assessment & Plan Note (Signed)
Patient reports improvement in symptoms since discontinuing Colestid. No current diarrhea or fecal leakage. 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 Rifaximin prescribed but not yet approved by insurance. Patient is considering holding off on treatment as symptoms are currently manageable. -Recommend holding off on Rifaximin as currently without any diarrhea. -Recommend the MRI of pelvis if no expensive copay, but may can have this done in [place] for convenience. -Recommend the anorectal manometry test scheduled for May, as this could be helpful in determining cause of sx. -Continue current management strategies including meditation, dietary modifications, and peppermint oil use. -Recommend trying to see GI in town thru same [facility] for convenience. Offered referral to [place] [facility] also if needed.

## 2024-02-06 LAB — CERVICOVAGINAL ANCILLARY ONLY
Bacterial Vaginitis (gardnerella): POSITIVE — AB
Candida Glabrata: NEGATIVE
Candida Vaginitis: NEGATIVE
Comment: NEGATIVE
Comment: NEGATIVE
Comment: NEGATIVE

## 2024-02-09 ENCOUNTER — Ambulatory Visit: Payer: Medicare Other | Admitting: Physical Therapy

## 2024-02-09 ENCOUNTER — Encounter: Payer: Self-pay | Admitting: Obstetrics and Gynecology

## 2024-02-09 ENCOUNTER — Encounter: Payer: Self-pay | Admitting: Physical Therapy

## 2024-02-09 ENCOUNTER — Encounter: Payer: Self-pay | Admitting: Family

## 2024-02-09 DIAGNOSIS — M25552 Pain in left hip: Secondary | ICD-10-CM

## 2024-02-09 MED ORDER — METRONIDAZOLE 0.75 % VA GEL: 1.0000 | Freq: Every day | VAGINAL | 0 refills | Status: DC

## 2024-02-09 NOTE — Therapy (Signed)
 OUTPATIENT PHYSICAL THERAPY LOWER EXTREMITY Treatment   Patient Name: Mackenzie Green MRN: [id number] DOB:February 17, 1946, 78 y.o., female Today's Date: 02/09/2024    END OF SESSION:  PT End of Session - 02/09/24 1616     Visit Number 17    Number of Visits 28    Date for PT Re-Evaluation 02/19/24    Authorization Type [employer] medicare,  PN done at visit 10, Re-cert done at visit 13.    Authorization - Visit Number 4    Authorization - Number of Visits 6    PT Start Time 1606    PT Stop Time 1645    PT Time Calculation (min) 39 min    Activity Tolerance Patient tolerated treatment well;No increased pain    Behavior During Therapy [facility] for tasks assessed/performed                      Past Medical History:  Diagnosis Date   Hypoglycemia 07/30/2012   Hypokalemia 07/31/2012   Hyponatremia 07/30/2012   Low iron    Mild cognitive impairment with memory loss    Mitral valve prolapse    Neutropenia (HCC) 08/27/2016   Past Surgical History:  Procedure Laterality Date   TEAR DUCT PROBING     TOTAL HIP ARTHROPLASTY Left 03/27/2022   Procedure: LEFT POSTERIOR TOTAL HIP ARTHROPLASTY;  Surgeon: Joen Laura, MD;  Location: [facility] OR;  Service: Orthopedics;  Laterality: Left;   Patient Active Problem List   Diagnosis Date Noted   Pelvic floor weakness 02/05/2024   Age related osteoporosis 08/22/2023   Irritable bowel syndrome with diarrhea 08/22/2023   Hip fracture (HCC) 03/26/2022   Mild cognitive impairment with memory loss 03/26/2022   Spinal stenosis at L4-L5 level 03/26/2022    PCP: Dulce Sellar  REFERRING PROVIDER: Dulce Sellar  REFERRING DIAG: L hip pain  THERAPY DIAG:  Pain in left hip  Rationale for Evaluation and Treatment: Rehabilitation  ONSET DATE: April 2023   SUBJECTIVE:   SUBJECTIVE STATEMENT:  02/09/2024 Pt states hip is feeling better, was sore, but she has been using ice and stretching. Has done less activity/strengthening  due to having UTI and not feeling great.   Eval: Pt had fall in April 2023, with hip fracture, and subsequent THA. She states good recovery after surgery, but did start having some bursitis type pain in the fall. She was having PT for this as well as pelvic floor. She stopped PT in September, and notes increased pain in the last couple months. She has stayed active walking and doing exercises, some gym machines and some classes - chair yoga.  She has newer diagnosis of Oseoporosis, currently not taking medication for treatment for this. She has 1 step to enter her home.  States pain in  hip, feels tight, stiff, knotted.    PERTINENT HISTORY: L THA  April 2023   PAIN:  Are you having pain? Yes: NPRS scale: 0-3 /10 Pain location: L hip Pain description: tight,  sore Aggravating factors: walking, increased activity  Relieving factors: none stated   PRECAUTIONS: None  WEIGHT BEARING RESTRICTIONS: No  FALLS:  Has patient fallen in last 6 months? No  PLOF: Independent  PATIENT GOALS:  Decreased pain   NEXT MD VISIT:   OBJECTIVE: updated 01/08/24  DIAGNOSTIC FINDINGS:   PATIENT SURVEYS:    COGNITION: Overall cognitive status: Within functional limits for tasks assessed     SENSATION: WFL  EDEMA:   POSTURE:  PALPATION:     LOWER EXTREMITY ROM:  Lumbar: WFL Hips: R: WFL,  L: mild limitation for ER, IR Knees: WFL  LOWER EXTREMITY MMT:  MMT Left eval Right  eval Left 12/23/23 Left 01/08/24  Hip flexion 3 (noted IR position) 4+ 4- 4-  Hip extension      Hip abduction 4-  4 4  Hip adduction      Hip internal rotation      Hip external rotation      Knee flexion 4 5 4  4+  Knee extension 4- 4+ 4 4+  Ankle dorsiflexion      Ankle plantarflexion      Ankle inversion      Ankle eversion       (Blank rows = not tested)  LOWER EXTREMITY SPECIAL TESTS:   GAIT:   TODAY'S TREATMENT:                                                                                                                               DATE:    02/09/2024 Therapeutic Exercise: Aerobic:  Supine:   bridging 2 x 10;  Seated:  S/L: clams 2 x 10(pain) ;  hip abd 2 x 10;  Standing:    Stretches:  Neuromuscular Re-education: Manual Therapy:   Therapeutic Activity: lateral step ups 6 in x 10 bil;  side stepping 10 ft x 2;  Lateral squat walks 20 ft x 6 ;  Squats 8 lb x 15;  Modalities:  Self Care: reviewed walking program and need for slow progression, as well as activities to do and avoid at gym. Trigger Point Dry Needling    Therapeutic Exercise: Aerobic:  Supine:   Seated:  S/L: clams 2 x 10 Standing:    Stretches:  piriformis fig 4 30 sec x 3 bil;  Hip IR across body to opp shoulder 30 sec x 3 ; reviewed both for HEP, hip ER fallouts x 15, 3 sec holds  Neuromuscular Re-education: Manual Therapy:  DTM to L piriformis and glute med, manual ROM for hip flex, IR, ER   Therapeutic Activity: Modalities: moist heat pack to L glute x 10 min at end of session.  Self Care:  Trigger Point Dry Needling   Therapeutic Exercise: Aerobic:  Supine:  bridging 2 x 10 with education on mechanics.  S/L:  Seated:  Prone:  Standing:   Step ups 6 in, Lateral,  1 UE support,  x 12 ea bil;  crossover step up with L (for adductors x 10);   Carrys 10 lb x 2 each;   band walks RTB 25 ft x 6;   Marching/slow x 25 Stretches: supine, piriformis- Hip IR across body to opp shoulder 30 sec x 3 ; (review next visit)  Neuromuscular Re-education: Manual Therapy: Therapeutic Activity: Self Care:     PATIENT EDUCATION:  Education details: updated and reviewed HEP Person educated: Patient Education method: Explanation, Demonstration, Tactile cues, Verbal cues,  and Handouts Education comprehension: verbalized understanding, returned demonstration, verbal cues required, tactile cues required, and needs further education   HOME EXERCISE PROGRAM: Access Code: [email or web address]   ASSESSMENT:    CLINICAL IMPRESSION: 02/09/2024 Education on slow progression walking program,  Discussed starting walking program with 10-15 min, every 3rd day. Pt with little pain today, but does have pain with s/l clams. Reviewed importance of continued strengthening. Pt is progressing well with strength, but requires mod/max cueing for optimal mechanics with exercise. Demonstrates weakness with s/l hip strength, but improving strength with standing activity. Pt to benefit from continued care .    Eval: Patient presents with primary complaint of  pain in L hip. She has significant weakness with testing today for anterior hip >lateral hip, which is likely contributing to her pain with activity. She will benefit from education on HEP and strengthening for hip, to improve mechanics and pain with standing and functional activities.Pt with decreased ability and tolerance for functional activities, IADLS, stairs, and walking. Pt will  benefit from skilled PT to improve deficits and pain and to return to PLOF.   OBJECTIVE IMPAIRMENTS: Abnormal gait, decreased activity tolerance, decreased mobility, decreased ROM, decreased strength, increased muscle spasms, improper body mechanics, and pain.   ACTIVITY LIMITATIONS: lifting, bending, standing, squatting, stairs, transfers, hygiene/grooming, and locomotion level  PARTICIPATION LIMITATIONS: cleaning, driving, and community activity  PERSONAL FACTORS: Past/current experiences and Time since onset of injury/illness/exacerbation are also affecting patient's functional outcome.   REHAB POTENTIAL: Good  CLINICAL DECISION MAKING: Stable/uncomplicated  EVALUATION COMPLEXITY: Low   GOALS: Goals reviewed with patient? Yes   SHORT TERM GOALS: Target date: 11/27/2023  Pt to be independent with initial HEP  Goal status: MET  2.  Pt to demo ability for SLR with optimal mechanics (strength to at least 3/5)   Goal status: MET   LONG TERM GOALS: Target date:  02/19/2024  Pt to be independent with final HEP  Goal status: In progress   2.  Pt to demo improved strength of L hip to be at least 4/5 to improve stability, stairs, and gait   Goal status: In progress   3.  Pt to report decreased pain in L hip to 0-2/10 with activity.   Goal status: In progress   4.  Pt to improve ability for stairs climbing without pain or deficit, with 1 UE support.   Goal status: In progress    PLAN:  PT FREQUENCY: 1-2x/week  PT DURATION: 6 weeks  PLANNED INTERVENTIONS: Therapeutic exercises, Therapeutic activity, Neuromuscular re-education, Patient/Family education, Self Care, Joint mobilization, Joint manipulation, Stair training, Orthotic/Fit training, DME instructions, Aquatic Therapy, Dry Needling, Electrical stimulation, Cryotherapy, Moist heat, Taping, Ultrasound, Ionotophoresis 4mg /ml Dexamethasone, Manual therapy,  Vasopneumatic device, Traction, Spinal manipulation, Spinal mobilization,Balance training, Gait training,   PLAN FOR NEXT SESSION:     Sedalia Muta, PT, DPT 4:16 PM  02/09/24

## 2024-02-19 ENCOUNTER — Ambulatory Visit: Payer: Medicare Other | Admitting: Physical Therapy

## 2024-02-19 ENCOUNTER — Encounter: Payer: Self-pay | Admitting: Physical Therapy

## 2024-02-19 DIAGNOSIS — M6281 Muscle weakness (generalized): Secondary | ICD-10-CM | POA: Diagnosis not present

## 2024-02-19 DIAGNOSIS — M62838 Other muscle spasm: Secondary | ICD-10-CM | POA: Diagnosis not present

## 2024-02-19 DIAGNOSIS — M25552 Pain in left hip: Secondary | ICD-10-CM

## 2024-02-19 NOTE — Therapy (Signed)
 OUTPATIENT PHYSICAL THERAPY LOWER EXTREMITY Treatment   Patient Name: Mackenzie Green MRN: [id number] DOB:1946-09-09, 78 y.o., female Today's Date: 02/19/2024    END OF SESSION:  PT End of Session - 02/19/24 0844     Visit Number 18    Number of Visits 28    Date for PT Re-Evaluation 02/19/24    Authorization Type [employer] medicare,  PN done at visit 10, Re-cert done at visit 13.    Authorization - Visit Number 5    Authorization - Number of Visits 6    PT Start Time [phone]    PT Stop Time 0930    PT Time Calculation (min) 46 min    Activity Tolerance Patient tolerated treatment well;No increased pain    Behavior During Therapy [facility] for tasks assessed/performed                       Past Medical History:  Diagnosis Date   Hypoglycemia 07/30/2012   Hypokalemia 07/31/2012   Hyponatremia 07/30/2012   Low iron    Mild cognitive impairment with memory loss    Mitral valve prolapse    Neutropenia (HCC) 08/27/2016   Past Surgical History:  Procedure Laterality Date   TEAR DUCT PROBING     TOTAL HIP ARTHROPLASTY Left 03/27/2022   Procedure: LEFT POSTERIOR TOTAL HIP ARTHROPLASTY;  Surgeon: Joen Laura, MD;  Location: [facility] OR;  Service: Orthopedics;  Laterality: Left;   Patient Active Problem List   Diagnosis Date Noted   Pelvic floor weakness 02/05/2024   Age related osteoporosis 08/22/2023   Irritable bowel syndrome with diarrhea 08/22/2023   Hip fracture (HCC) 03/26/2022   Mild cognitive impairment with memory loss 03/26/2022   Spinal stenosis at L4-L5 level 03/26/2022    PCP: Dulce Sellar  REFERRING PROVIDER: Dulce Sellar  REFERRING DIAG: L hip pain  THERAPY DIAG:  Pain in left hip  Other muscle spasm  Muscle weakness (generalized)  Rationale for Evaluation and Treatment: Rehabilitation  ONSET DATE: April 2023   SUBJECTIVE:   SUBJECTIVE STATEMENT:  02/19/2024 Pt stood for 2 hours at [employer], hip was sore that day and next  couple days. Has been able to walk a short distance outside, without increased pain a few times.   Pt states hip is feeling better, was sore, but she has been using ice and stretching. Has done less activity/strengthening due to having UTI and not feeling great.   Eval: Pt had fall in April 2023, with hip fracture, and subsequent THA. She states good recovery after surgery, but did start having some bursitis type pain in the fall. She was having PT for this as well as pelvic floor. She stopped PT in September, and notes increased pain in the last couple months. She has stayed active walking and doing exercises, some gym machines and some classes - chair yoga.  She has newer diagnosis of Oseoporosis, currently not taking medication for treatment for this. She has 1 step to enter her home.  States pain in  hip, feels tight, stiff, knotted.    PERTINENT HISTORY: L THA  April 2023   PAIN:  Are you having pain? Yes: NPRS scale: 0-3 /10 Pain location: L hip Pain description: tight,  sore Aggravating factors: walking, increased activity  Relieving factors: none stated   PRECAUTIONS: None  WEIGHT BEARING RESTRICTIONS: No  FALLS:  Has patient fallen in last 6 months? No  PLOF: Independent  PATIENT GOALS:  Decreased pain   NEXT  MD VISIT:   OBJECTIVE: updated 01/08/24  DIAGNOSTIC FINDINGS:   PATIENT SURVEYS:    COGNITION: Overall cognitive status: Within functional limits for tasks assessed     SENSATION: WFL  EDEMA:   POSTURE:      PALPATION:     LOWER EXTREMITY ROM:  Lumbar: WFL Hips: R: WFL,  L: mild limitation for ER, IR Knees: WFL  LOWER EXTREMITY MMT:  MMT Left eval Right  eval Left 12/23/23 Left 01/08/24  Hip flexion 3 (noted IR position) 4+ 4- 4-  Hip extension      Hip abduction 4-  4 4  Hip adduction      Hip internal rotation      Hip external rotation      Knee flexion 4 5 4  4+  Knee extension 4- 4+ 4 4+  Ankle dorsiflexion      Ankle  plantarflexion      Ankle inversion      Ankle eversion       (Blank rows = not tested)  LOWER EXTREMITY SPECIAL TESTS:   GAIT:   TODAY'S TREATMENT:                                                                                                                              DATE:    02/19/2024 Therapeutic Exercise: Aerobic:  Supine:  Clams GTB alternating x 25;  Seated:  S/L: clams  x 8 (pain) ;  hip abd 2 x 10;  Standing:   hip abd and ext 2 x 10 ea bil;  cueing for decreasing trunk compensation Stretches:  Neuromuscular Re-education: Manual Therapy:   Therapeutic Activity:  side stepping 20  ft x 6;   Squats 2 x 10;  Modalities:   Iontophoresis, dexamethasone with 4 hr patch, to L gr troch.  Self Care:  Trigger Point Dry Needling    Therapeutic Exercise: Aerobic:  Supine:   Seated:  S/L: clams 2 x 10 Standing:    Stretches:  piriformis fig 4 30 sec x 3 bil;  Hip IR across body to opp shoulder 30 sec x 3 ; reviewed both for HEP, hip ER fallouts x 15, 3 sec holds  Neuromuscular Re-education: Manual Therapy:  DTM to L piriformis and glute med, manual ROM for hip flex, IR, ER   Therapeutic Activity: Modalities: moist heat pack to L glute x 10 min at end of session.  Self Care:  Trigger Point Dry Needling   Therapeutic Exercise: Aerobic:  Supine:  bridging 2 x 10 with education on mechanics.  S/L:  Seated:  Prone:  Standing:   Step ups 6 in, Lateral,  1 UE support,  x 12 ea bil;  crossover step up with L (for adductors x 10);   Carrys 10 lb x 2 each;   band walks RTB 25 ft x 6;   Marching/slow x 25 Stretches: supine, piriformis- Hip IR across body to opp shoulder 30 sec x 3 ; (  review next visit)  Neuromuscular Re-education: Manual Therapy: Therapeutic Activity: Self Care:     PATIENT EDUCATION:  Education details: updated and reviewed HEP Person educated: Patient Education method: Explanation, Demonstration, Tactile cues, Verbal cues, and Handouts Education  comprehension: verbalized understanding, returned demonstration, verbal cues required, tactile cues required, and needs further education   HOME EXERCISE PROGRAM: Access Code: [email or web address]   ASSESSMENT:   CLINICAL IMPRESSION: 02/19/2024 Education on continued strengthening. Pt with good tolerance for activities today. Weakness and instability in hip /glute complex seen with standing activity and difficulty activating glute , as well as hip drop with SLS. She continues to have symptoms of bursitis at this time. Trial for ionto patch to L gr troch. We also discussed seeing sports med due to continued pain at this time.   Eval: Patient presents with primary complaint of  pain in L hip. She has significant weakness with testing today for anterior hip >lateral hip, which is likely contributing to her pain with activity. She will benefit from education on HEP and strengthening for hip, to improve mechanics and pain with standing and functional activities.Pt with decreased ability and tolerance for functional activities, IADLS, stairs, and walking. Pt will  benefit from skilled PT to improve deficits and pain and to return to PLOF.   OBJECTIVE IMPAIRMENTS: Abnormal gait, decreased activity tolerance, decreased mobility, decreased ROM, decreased strength, increased muscle spasms, improper body mechanics, and pain.   ACTIVITY LIMITATIONS: lifting, bending, standing, squatting, stairs, transfers, hygiene/grooming, and locomotion level  PARTICIPATION LIMITATIONS: cleaning, driving, and community activity  PERSONAL FACTORS: Past/current experiences and Time since onset of injury/illness/exacerbation are also affecting patient's functional outcome.   REHAB POTENTIAL: Good  CLINICAL DECISION MAKING: Stable/uncomplicated  EVALUATION COMPLEXITY: Low   GOALS: Goals reviewed with patient? Yes   SHORT TERM GOALS: Target date: 11/27/2023  Pt to be independent with initial HEP  Goal status: MET  2.  Pt  to demo ability for SLR with optimal mechanics (strength to at least 3/5)   Goal status: MET   LONG TERM GOALS: Target date: 02/19/2024  Pt to be independent with final HEP  Goal status: In progress   2.  Pt to demo improved strength of L hip to be at least 4/5 to improve stability, stairs, and gait   Goal status: In progress   3.  Pt to report decreased pain in L hip to 0-2/10 with activity.   Goal status: In progress   4.  Pt to improve ability for stairs climbing without pain or deficit, with 1 UE support.   Goal status: In progress    PLAN:  PT FREQUENCY: 1-2x/week  PT DURATION: 6 weeks  PLANNED INTERVENTIONS: Therapeutic exercises, Therapeutic activity, Neuromuscular re-education, Patient/Family education, Self Care, Joint mobilization, Joint manipulation, Stair training, Orthotic/Fit training, DME instructions, Aquatic Therapy, Dry Needling, Electrical stimulation, Cryotherapy, Moist heat, Taping, Ultrasound, Ionotophoresis 4mg /ml Dexamethasone, Manual therapy,  Vasopneumatic device, Traction, Spinal manipulation, Spinal mobilization,Balance training, Gait training,   PLAN FOR NEXT SESSION:     Sedalia Muta, PT, DPT 9:34 AM  02/19/24

## 2024-02-23 ENCOUNTER — Ambulatory Visit: Payer: Medicare Other | Admitting: Family Medicine

## 2024-02-23 VITALS — BP 96/62 | Ht 65.0 in | Wt 106.0 lb

## 2024-02-23 DIAGNOSIS — M25559 Pain in unspecified hip: Secondary | ICD-10-CM | POA: Diagnosis not present

## 2024-02-23 DIAGNOSIS — S72002S Fracture of unspecified part of neck of left femur, sequela: Secondary | ICD-10-CM | POA: Diagnosis not present

## 2024-02-23 NOTE — Patient Instructions (Addendum)
 Thank you for coming in today.  Off-loading cushion turned sideways  Continued HEP (home exercise program)  See you back in 1 month

## 2024-02-23 NOTE — Progress Notes (Unsigned)
   Rubin Payor, PhD, LAT, ATC acting as a scribe for Clementeen Graham, MD.  Mackenzie Green is a 78 y.o. female who presents to [facility] at [facility] today for L hip pain. Pt has already completed 18 PT visits. Hx of a L hip fx and total hip replacement on 03/27/22. Pt locates pain to the lateral aspect of her L hip. Hx of osteoporosis, but notes she is not interested in taking any medications.   Aggravates: difficult to determine Treatments tried: PT, massage, dry needling, worked w/ Systems analyst, prior CSI  Dx imaging: 03/27/22 L hip XR  Pertinent review of systems: No fevers or chills  Relevant historical information: IBS and osteoporosis   Exam:  BP 96/62   Ht 5\' 5"  (1.651 m)   Wt 106 lb (48.1 kg)   BMI 17.64 kg/m  General: Well Developed, well nourished, and in no acute distress.   MSK: Left hip decreased muscle mass. Tender palpation greater trochanter. Normal hip motion. Tender palpation at greater trochanter. Hip abduction strength 4+/5.  External rotation strength 4/5. Mild antalgic gait    Lab and Radiology Results   EXAM: DG HIP (WITH OR WITHOUT PELVIS) 2-3V LEFT   COMPARISON:  03/27/2022.   FINDINGS: There is a left hip arthroplasty in anatomic alignment. No evidence for fracture. There is surrounding soft tissue swelling and air compatible with recent surgery.   IMPRESSION: 1. Left hip arthroplasty in anatomic alignment with recent postoperative changes.     Electronically Signed   By: Darliss Cheney M.D.   On: 03/27/2022 20:39 I, Clementeen Graham, personally (independently) visualized and performed the interpretation of the images attached in this note.    Assessment and Plan: 78 y.o. female with persistent left lateral hip pain following arthroplasty for broken hip.  She has done an excellent job of physical therapy to try to rehab her hip but still having pain and some weakness.  Her pain is multifactorial. She is getting some  excessive pressure at the lateral hip from sitting position especially when driving her car.  She will benefit from a offloading cushion.  She can modify coccyx off loader cushion and apply that underneath the lateral hip when driving.  That may be helpful.  It is possible she has some nerve injury as a result of her fracture and surgery.  Specifically I worry about the superior gluteal nerve.  She does have better strength and I would expect for this injury but it is a possibility.  Will discuss with neurology about potentially doing a nerve conduction study.  Recheck in 1 month.  PDMP not reviewed this encounter. No orders of the defined types were placed in this encounter.  No orders of the defined types were placed in this encounter.    Discussed warning signs or symptoms. Please see discharge instructions. Patient expresses understanding.  The above documentation has been reviewed and is accurate and complete Clementeen Graham, M.D.

## 2024-02-23 NOTE — Telephone Encounter (Signed)
 I spoke with patient to see if patient wanted and appointment to have pessary replaced. Patient stated she is doing better and have finished the metro gel. She will call us if she has any further issues with her pessary.

## 2024-02-25 ENCOUNTER — Telehealth: Payer: Self-pay | Admitting: Family Medicine

## 2024-02-25 DIAGNOSIS — S72002S Fracture of unspecified part of neck of left femur, sequela: Secondary | ICD-10-CM

## 2024-02-25 DIAGNOSIS — M25559 Pain in unspecified hip: Secondary | ICD-10-CM

## 2024-02-25 DIAGNOSIS — R29898 Other symptoms and signs involving the musculoskeletal system: Secondary | ICD-10-CM

## 2024-02-25 NOTE — Telephone Encounter (Signed)
-----   Message from Antony Madura sent at 02/24/2024  7:43 AM EST ----- Regarding: RE: EMG.NCV assess superior gluteal nerve? Clayburn Pert,  Thank you for reaching out. The short answer is yes. Nerve conduction studies will be normal in an isolated superior gluteal nerve injury as there is no good way to stimulate this nerve, so we don't. We check others though to rule out other pathologies. On needle examination, there are 3 superior gluteal innervated muscles, 2 of which we can easily evaluate and possibly the 3rd. If the study was normal other than that portion of the needle, then you could make the diagnosis of a superior gluteal mononeuropathy.  We would be happy to evaluate this patient by EMG if you would like.  Othelia Pulling ----- Message ----- From: Rodolph Bong, MD Sent: 02/24/2024   5:58 AM EST To: Antony Madura, MD Subject: EMG.NCV assess superior gluteal nerve?         Hello Dr. Loleta Chance,  Before I send this patient to you for nerve conduction study/EMG I want a make sure that the test can even detect what I am looking for.  I am concerned that this patient may have had an injury to I think the superior gluteal nerve during hip fracture and surgery.  She has some persistent weakness to hip abduction and pain in the lateral hip.  Can a nerve conduction study/EMG answer that question?  Thank so much appreciate your time. Clayburn Pert

## 2024-02-25 NOTE — Telephone Encounter (Signed)
 I spoke with the neurologist.  It is worth trying to do a nerve conduction study to evaluate isolated nerve injury and weakness.  You should hear from [facility] neurology office soon.

## 2024-02-26 ENCOUNTER — Other Ambulatory Visit: Payer: Self-pay

## 2024-02-26 DIAGNOSIS — R202 Paresthesia of skin: Secondary | ICD-10-CM

## 2024-02-27 ENCOUNTER — Ambulatory Visit: Payer: Medicare Other | Admitting: Obstetrics and Gynecology

## 2024-03-01 ENCOUNTER — Encounter: Payer: Medicare Other | Admitting: Physical Therapy

## 2024-03-01 ENCOUNTER — Ambulatory Visit: Admitting: Neurology

## 2024-03-01 DIAGNOSIS — L853 Xerosis cutis: Secondary | ICD-10-CM | POA: Diagnosis not present

## 2024-03-01 DIAGNOSIS — R202 Paresthesia of skin: Secondary | ICD-10-CM

## 2024-03-01 DIAGNOSIS — L820 Inflamed seborrheic keratosis: Secondary | ICD-10-CM | POA: Diagnosis not present

## 2024-03-01 DIAGNOSIS — L638 Other alopecia areata: Secondary | ICD-10-CM | POA: Diagnosis not present

## 2024-03-01 DIAGNOSIS — D485 Neoplasm of uncertain behavior of skin: Secondary | ICD-10-CM | POA: Diagnosis not present

## 2024-03-01 DIAGNOSIS — Z85828 Personal history of other malignant neoplasm of skin: Secondary | ICD-10-CM | POA: Diagnosis not present

## 2024-03-01 DIAGNOSIS — L821 Other seborrheic keratosis: Secondary | ICD-10-CM | POA: Diagnosis not present

## 2024-03-01 NOTE — Procedures (Signed)
 [facility]  [address] [place], [address]  [place], [place] [address] Tel: [phone] Fax: [phone] Test Date:  03/01/2024  Patient: Mackenzie Green DOB: 1946/11/17 Physician: Jacquelyne Balint, MD  Sex: Female Height: 5\' 5"  Ref Phys: Clementeen Graham, MD  ID#: [id number]   Technician:    History: This is a 78 year old female with left hip pain.  NCV & EMG Findings: Extensive electrodiagnostic evaluation of the left lower Green with additional nerve conduction studies of the right lower Green shows: Bilateral sural and superficial peroneal/fibular sensory responses are absent. Left peroneal/fibular (EDB) and tibial (AH) motor responses are within normal limits. Left H reflex latency is within normal limits. There is no evidence of active or chronic motor axon loss changes affecting any of the tested muscles on needle examination. Motor unit configuration and recruitment pattern is within normal limits.  Impression: This study shows no definitive electrodiagnostic abnormalities. Particularly: Absent sural and superficial peroneal/fibular sensory responses bilaterally may be normal for age or evidence of a large fiber sensorimotor neuropathy. Recommend clinical correlation. No definitive electrodiagnostic evidence of a left superior gluteal mononeuropathy. No electrodiagnostic evidence of a left lumbosacral (L3-S1) motor radiculopathy.    ___________________________ Jacquelyne Balint, MD    Nerve Conduction Studies Motor Nerve Results    Latency Amplitude F-Lat Segment Distance CV Comment  Site (ms) Norm (mV) Norm (ms)  (cm) (m/s) Norm   Left Fibular (EDB) Motor  Ankle 3.7  < 6.0 2.6  > 2.5        Bel fib head 10.8 - 2.4 -  Bel fib head-Ankle 32 45  > 40   Pop fossa 12.5 - 2.3 -  Pop fossa-Bel fib head 9 53 -   Left Tibial (AH) Motor  Ankle 5.1  < 6.0 6.0  > 4.0        Knee 12.8 - 5.7 -  Knee-Ankle 40 52  > 40    Sensory Sites    Neg Peak Lat Amplitude (O-P) Segment Distance  Velocity Comment  Site (ms) Norm (V) Norm  (cm) (ms)   Left Superficial Fibular Sensory  14 cm-Ankle *NR  < 4.6 *NR  > 3 14 cm-Ankle 14    Right Superficial Fibular Sensory  14 cm-Ankle *NR  < 4.6 *NR  > 3 14 cm-Ankle 14    Left Sural Sensory  Calf-Lat mall *NR  < 4.6 *NR  > 3 Calf-Lat mall 14    Right Sural Sensory  Calf-Lat mall *NR  < 4.6 *NR  > 3 Calf-Lat mall 14     H-Reflex Results    M-Lat H Lat H Neg Amp H-M Lat  Site (ms) (ms) Norm (mV) (ms)  Left Tibial H-Reflex  Pop fossa 6.4 32.7  < 35.0 1.34 26.3   Electromyography   Side Muscle Ins.Act Fibs Fasc Recrt Amp Dur Poly Activation Comment  Left Tib ant Nml Nml Nml Nml Nml Nml Nml Nml N/A  Left Gastroc MH Nml Nml Nml Nml Nml Nml Nml Nml N/A  Left Rectus fem Nml Nml Nml Nml Nml Nml Nml Nml N/A  Left Biceps fem SH Nml Nml Nml Nml Nml Nml Nml Nml N/A  Left Gluteus med Nml Nml Nml Nml Nml Nml Nml *1- N/A  Left Tens fasc lat Nml Nml Nml Nml Nml Nml Nml *1- N/A      Waveforms:  Motor      Sensory           H-Reflex

## 2024-03-02 ENCOUNTER — Encounter: Payer: Self-pay | Admitting: Family Medicine

## 2024-03-02 NOTE — Progress Notes (Signed)
 Nerve conduction study shows nerve irritation or injury further down the leg but nothing into the hip where you are having problems.  Recommend return to clinic to talk about treatment plan and options going forward.  So far this was somewhat reassuring

## 2024-03-08 DIAGNOSIS — H04123 Dry eye syndrome of bilateral lacrimal glands: Secondary | ICD-10-CM | POA: Diagnosis not present

## 2024-03-08 DIAGNOSIS — H524 Presbyopia: Secondary | ICD-10-CM | POA: Diagnosis not present

## 2024-03-08 DIAGNOSIS — H35363 Drusen (degenerative) of macula, bilateral: Secondary | ICD-10-CM | POA: Diagnosis not present

## 2024-03-08 DIAGNOSIS — H40033 Anatomical narrow angle, bilateral: Secondary | ICD-10-CM | POA: Diagnosis not present

## 2024-03-08 DIAGNOSIS — H25813 Combined forms of age-related cataract, bilateral: Secondary | ICD-10-CM | POA: Diagnosis not present

## 2024-03-08 NOTE — Progress Notes (Unsigned)
 Rubin Payor, PhD, LAT, ATC acting as a scribe for Clementeen Graham, MD.  Mackenzie Green is a 78 y.o. female who presents to [facility] today for f/u L hip pain after arthroplasty for broken hip. Pt was last seen by Dr. Denyse Amass on 02/23/24 and was advised to use an off-loading and a LE NCV study was ordered.   Today, pt reports L hip is feeling about the same. She has been compliant w/ using the off-loading cushion, but has some questions on how to bed position it.  Pt also c/o R foot pain that's an old injury from 10 years ago. Pt has an upcoming long drive and it's bothersome. Pt locates pain to the plantar aspect of the R calcaneous, long both sides, and posteriorly.  Dx imaging: 03/01/24 LE NCV study 03/27/22 L hip XR   Pertinent review of systems: No fevers or chills  Relevant historical information: Hip fracture and osteoporosis   Exam:  BP 100/68   Pulse 75   Ht 5\' 5"  (1.651 m)   Wt 104 lb (47.2 kg)   SpO2 95%   BMI 17.31 kg/m  General: Well Developed, well nourished, and in no acute distress.   MSK: Left hip mature scar lateral hip tender palpation greater trochanter reduced strength hip abduction and external rotation.  Right calcaneus normal appearing Tender palpation posterior calcaneus.  Normal foot and ankle motion.  Intact strength.    Lab and Radiology Results  Procedure: Real-time Ultrasound Guided Injection of left greater trochanter bursa Device: Philips Affiniti 50G/GE Logiq Images permanently stored and available for review in PACS Verbal informed consent obtained.  Discussed risks and benefits of procedure. Warned about infection, bleeding, hyperglycemia damage to structures among others. Patient expresses understanding and agreement Time-out conducted.   Noted no overlying erythema, induration, or other signs of local infection.   Skin prepped in a sterile fashion.   Local anesthesia: Topical Ethyl chloride.   With  sterile technique and under real time ultrasound guidance: 40 mg of Kenalog and 2 mL of Marcaine injected into greater trochanter bursa. Fluid seen entering the bursa.   Completed without difficulty   Pain immediately resolved suggesting accurate placement of the medication.   Advised to call if fevers/chills, erythema, induration, drainage, or persistent bleeding.   Images permanently stored and available for review in the ultrasound unit.  Impression: Technically successful ultrasound guided injection.     Nerve conduction study dated March 01 2024 [facility]. NCV & EMG Findings: Extensive electrodiagnostic evaluation of the left lower limb with additional nerve conduction studies of the right lower limb shows: Bilateral sural and superficial peroneal/fibular sensory responses are absent. Left peroneal/fibular (EDB) and tibial (AH) motor responses are within normal limits. Left H reflex latency is within normal limits. There is no evidence of active or chronic motor axon loss changes affecting any of the tested muscles on needle examination. Motor unit configuration and recruitment pattern is within normal limits.   Impression: This study shows no definitive electrodiagnostic abnormalities. Particularly: Absent sural and superficial peroneal/fibular sensory responses bilaterally may be normal for age or evidence of a large fiber sensorimotor neuropathy. Recommend clinical correlation. No definitive electrodiagnostic evidence of a left superior gluteal mononeuropathy. No electrodiagnostic evidence of a left lumbosacral (L3-S1) motor radiculopathy.   Assessment and Plan: 78 y.o. female with chronic left hip pain and weakness.  No evidence of nerve injury affecting hip abduction strength or lumbar radiculopathy based on nerve conduction  study.  She has had great trials of physical therapy and and is just not getting better enough.  She had an intramuscular steroid injection over a year ago.   Will go ahead and proceed with a greater trochanter bursa injection and continue to work on offloading.  She did have pretty good benefit after injection indicating that we got the cortisone where the pain is at least. Next step may be shockwave or advanced imaging if feasible.  Right heel pain due to Achilles tendinitis.  Plan for eccentric exercises and night splints.  Recheck in 1 month PDMP not reviewed this encounter. Orders Placed This Encounter  Procedures   [place] LIMITED JOINT SPACE STRUCTURES LOW RIGHT(NO LINKED CHARGES)    Reason for Exam (SYMPTOM  OR DIAGNOSIS REQUIRED):   right foot pain    Preferred imaging location?:   [facility]   No orders of the defined types were placed in this encounter.    Discussed warning signs or symptoms. Please see discharge instructions. Patient expresses understanding.   The above documentation has been reviewed and is accurate and complete Clementeen Graham, M.D.

## 2024-03-09 ENCOUNTER — Ambulatory Visit: Admitting: Family Medicine

## 2024-03-09 ENCOUNTER — Other Ambulatory Visit: Payer: Self-pay

## 2024-03-09 VITALS — BP 100/68 | HR 75 | Ht 65.0 in | Wt 104.0 lb

## 2024-03-09 DIAGNOSIS — G629 Polyneuropathy, unspecified: Secondary | ICD-10-CM | POA: Insufficient documentation

## 2024-03-09 DIAGNOSIS — M7661 Achilles tendinitis, right leg: Secondary | ICD-10-CM

## 2024-03-09 DIAGNOSIS — M25552 Pain in left hip: Secondary | ICD-10-CM | POA: Diagnosis not present

## 2024-03-09 DIAGNOSIS — M79671 Pain in right foot: Secondary | ICD-10-CM | POA: Diagnosis not present

## 2024-03-09 DIAGNOSIS — M25559 Pain in unspecified hip: Secondary | ICD-10-CM | POA: Insufficient documentation

## 2024-03-09 NOTE — Patient Instructions (Addendum)
 Thank you for coming in today.   Gel heel cup  Please work on the home exercises the athletic trainer went over with you:  View at [email or web address] using code: [email or web address]  Check back in 1 month

## 2024-03-17 ENCOUNTER — Encounter: Payer: Self-pay | Admitting: Physical Therapy

## 2024-03-17 ENCOUNTER — Ambulatory Visit: Admitting: Physical Therapy

## 2024-03-17 DIAGNOSIS — M6281 Muscle weakness (generalized): Secondary | ICD-10-CM | POA: Diagnosis not present

## 2024-03-17 DIAGNOSIS — M25552 Pain in left hip: Secondary | ICD-10-CM | POA: Diagnosis not present

## 2024-03-17 DIAGNOSIS — M62838 Other muscle spasm: Secondary | ICD-10-CM | POA: Diagnosis not present

## 2024-03-17 NOTE — Therapy (Signed)
 OUTPATIENT PHYSICAL THERAPY LOWER EXTREMITY Treatment/Re-Cert    Patient Name: Mackenzie Green MRN: [id number] DOB:1946/07/29, 78 y.o., female Today's Date: 05/20/4131  Re-cert for todays visit only./date extension  END OF SESSION:  PT End of Session - 03/24/24 0826     Visit Number 19    Number of Visits 28    Date for PT Re-Evaluation 03/18/24    Authorization Type [employer] medicare,  PN done at visit 10, Re-cert done at visit 13.    Authorization - Visit Number 6    Authorization - Number of Visits 6    PT Start Time [phone]    PT Stop Time 0930    PT Time Calculation (min) 46 min    Activity Tolerance Patient tolerated treatment well;No increased pain    Behavior During Therapy [facility] for tasks assessed/performed                       Past Medical History:  Diagnosis Date   Hypoglycemia 07/30/2012   Hypokalemia 07/31/2012   Hyponatremia 07/30/2012   Low iron    Mild cognitive impairment with memory loss    Mitral valve prolapse    Neutropenia (HCC) 08/27/2016   Past Surgical History:  Procedure Laterality Date   TEAR DUCT PROBING     TOTAL HIP ARTHROPLASTY Left 03/27/2022   Procedure: LEFT POSTERIOR TOTAL HIP ARTHROPLASTY;  Surgeon: Joen Laura, MD;  Location: [facility] OR;  Service: Orthopedics;  Laterality: Left;   Patient Active Problem List   Diagnosis Date Noted   Neuropathy 03/09/2024   Lateral pain of hip 03/09/2024   Pelvic floor weakness 02/05/2024   Age related osteoporosis 08/22/2023   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 Feels that her pain in lateral hip is improving since shot. She continues to feel some discomfort,  "tight and hard" in L hip. This sensation has not changed much overall  Also reports newer R ankle pain- in back of heel. Has had it previously, discussed with dr at last appt.  Overall pt doing well with activity, she has been doing HEP. Still feels limited with distance for walking/hiking like she used to do.    Eval: Pt had fall in April 2023, with hip fracture, and subsequent THA. She states good recovery after surgery, but did start having some bursitis type pain in the fall. She was having PT for this as well as pelvic floor. She stopped PT in September, and notes increased pain in the last couple months. She has stayed active walking and doing exercises, some gym machines and some classes - chair yoga.  She has newer diagnosis of Oseoporosis, currently not taking medication for treatment for this. She has 1 step to enter her home.  States pain in  hip, feels tight, stiff, knotted.    PERTINENT HISTORY: L THA  April 2023   PAIN:  Are you having pain? Yes: NPRS scale: 0-3 /10 Pain location: L hip Pain description: tight,  sore Aggravating factors: walking, increased  activity  Relieving factors: none stated   PRECAUTIONS: None  WEIGHT BEARING RESTRICTIONS: No  FALLS:  Has patient fallen in last 6 months? No  PLOF: Independent  PATIENT GOALS:  Decreased pain   NEXT MD VISIT:   OBJECTIVE: updated 03/17/24  DIAGNOSTIC FINDINGS:   PATIENT SURVEYS:    COGNITION: Overall cognitive status: Within functional limits for tasks assessed     SENSATION: WFL  EDEMA:   POSTURE:      PALPATION:     LOWER EXTREMITY ROM:  Lumbar: WFL Hips: R: WFL,  L: mild limitation for ER, IR Knees: WFL  LOWER EXTREMITY MMT:  MMT Left eval Right  eval Left 12/23/23 Left 01/08/24 Left 03/17/24   Hip flexion 3 (noted IR position) 4+ 4- 4- 4 to 4+  Hip extension       Hip abduction 4-  4 4 4  to 4+   Hip adduction       Hip internal rotation       Hip external rotation       Knee flexion 4 5 4  4+   Knee extension 4- 4+ 4 4+   Ankle dorsiflexion       Ankle plantarflexion       Ankle inversion       Ankle eversion        (Blank rows = not tested)  LOWER EXTREMITY SPECIAL TESTS:   GAIT:   TODAY'S TREATMENT:                                                                                                                              DATE:    03/17/2024 Therapeutic Exercise: Aerobic:  Supine:  Bridging 2 x 10, SLR x 10 bil;  Seated:  S/L: clams  x 8 (pain)instructed pt not to do for HEP if painful.  ;   hip abd 2 x 10;  Standing:   hip abd and ext 2 x 10 ea bil;  cueing for decreasing trunk compensation Stretches:  review for piriformis stretches x 2 ea bil;  Neuromuscular Re-education: Manual Therapy:   Therapeutic Activity:    Squats 2 x 10;   step ups x 10 ea bil, 6 in step no UE support ;  Modalities:  Self Care: education on exercise progression, slow progression of walking and hiking, need for continued strengthening, md f/u, and current progress Trigger Point Dry Needling    Therapeutic Exercise: Aerobic:  Supine:   Seated:  S/L: clams 2 x 10 Standing:    Stretches:  piriformis fig 4 30 sec x 3 bil;  Hip IR across body to opp shoulder 30 sec x 3 ; reviewed both for HEP, hip ER fallouts x 15, 3 sec holds  Neuromuscular Re-education: Manual Therapy:  DTM to L piriformis and glute med, manual ROM for hip flex, IR, ER   Therapeutic Activity: Modalities: moist heat pack to L glute x 10 min at end of session.  Self Care:  Trigger Point Dry Needling   Therapeutic Exercise: Aerobic:  Supine:  bridging 2 x 10 with education on mechanics.  S/L:  Seated:  Prone:  Standing:   Step ups 6 in, Lateral,  1 UE support,  x 12 ea bil;  crossover step up with L (for  adductors x 10);   Carrys 10 lb x 2 each;   band walks RTB 25 ft x 6;   Marching/slow x 25 Stretches: supine, piriformis- Hip IR across body to opp shoulder 30 sec x 3 ; (review next visit)  Neuromuscular Re-education: Manual Therapy: Therapeutic Activity: Self Care:     PATIENT EDUCATION:  Education details: updated and reviewed HEP Person educated: Patient Education method: Explanation, Demonstration, Tactile cues, Verbal cues, and Handouts Education comprehension: verbalized understanding, returned demonstration, verbal cues required, tactile cues required, and needs further education   HOME EXERCISE PROGRAM: Access Code: [email or web address]   ASSESSMENT:   CLINICAL IMPRESSION: 03/17/2024 Pt has been seen for 19 visits. She has good understanding of HEP. Continued education and stressed importance of continued strengthening for hip function. Overall she has much improved strength from start of PT. She has much improved function and improved mechanics with functional activity. She has mild discomfort in hip with stiff/tight sensation that has not improved significantly. She did just receive injection which hopefully will continue to improve pain. Discussed slow progression of walking and hiking in detail. Pt has met most goals at this time, and is ready for d/c to HEP. She will continue strengthening and see how much she improves. She will f/u with MD as needed. Pt in agreement with plan.   Eval: Patient presents with primary complaint of  pain in L hip. She has significant weakness with testing today for anterior hip >lateral hip, which is likely contributing to her pain with activity. She will benefit from education on HEP and strengthening for hip, to improve mechanics and pain with standing and functional activities.Pt with decreased ability and tolerance for functional activities, IADLS, stairs, and walking. Pt will  benefit from skilled PT to improve deficits and pain and to return to PLOF.    OBJECTIVE IMPAIRMENTS: Abnormal gait, decreased activity tolerance, decreased mobility, decreased ROM, decreased strength, increased muscle spasms, improper body mechanics, and pain.   ACTIVITY LIMITATIONS: lifting, bending, standing, squatting, stairs, transfers, hygiene/grooming, and locomotion level  PARTICIPATION LIMITATIONS: cleaning, driving, and community activity  PERSONAL FACTORS: Past/current experiences and Time since onset of injury/illness/exacerbation are also affecting patient's functional outcome.   REHAB POTENTIAL: Good  CLINICAL DECISION MAKING: Stable/uncomplicated  EVALUATION COMPLEXITY: Low   GOALS: Goals reviewed with patient? Yes   SHORT TERM GOALS: Target date: 11/27/2023  Pt to be independent with initial HEP  Goal status: MET  2.  Pt to demo ability for SLR with optimal mechanics (strength to at least 3/5)   Goal status: MET   LONG TERM GOALS: Target date: 03/17/24  Pt to be independent with final HEP  Goal status: MET  2.  Pt to demo improved strength of L hip to be at least 4/5 to improve stability, stairs, and gait   Goal status: MET  3.  Pt to report decreased pain in L hip to 0-2/10 with activity.   Goal status: partially MET  4.  Pt to improve ability for stairs climbing without pain or deficit, with 1 UE support.   Goal status: MET   PLAN:  PT FREQUENCY: 1-2x/week  PT DURATION: 6 weeks  PLANNED INTERVENTIONS:  Therapeutic exercises, Therapeutic activity, Neuromuscular re-education, Patient/Family education, Self Care, Joint mobilization, Joint manipulation, Stair training, Orthotic/Fit training, DME instructions, Aquatic Therapy, Dry Needling, Electrical stimulation, Cryotherapy, Moist heat, Taping, Ultrasound, Ionotophoresis 4mg /ml Dexamethasone, Manual therapy,  Vasopneumatic device, Traction, Spinal manipulation, Spinal mobilization,Balance training, Gait training,   PLAN FOR NEXT SESSION:     Sedalia Muta, PT,  DPT 8:27 AM  03/24/24   PHYSICAL THERAPY DISCHARGE SUMMARY  Visits from Start of Care: 19     Plan: Patient agrees to discharge.  Patient goals were  met. Patient is being discharged due to meeting the stated rehab goals.      Sedalia Muta, PT, DPT 8:27 AM  03/24/24

## 2024-03-23 ENCOUNTER — Ambulatory Visit

## 2024-03-23 VITALS — BP 104/64 | HR 68 | Temp 98.7°F | Ht 65.0 in | Wt 103.0 lb

## 2024-03-23 DIAGNOSIS — Z0000 Encounter for general adult medical examination without abnormal findings: Secondary | ICD-10-CM

## 2024-03-23 NOTE — Progress Notes (Addendum)
 Subjective:   Mackenzie Green is a 78 y.o. who presents for a Medicare Wellness preventive visit.  Visit Complete: In person  Persons Participating in Visit: Patient.  AWV Questionnaire: No: Patient Medicare AWV questionnaire was not completed prior to this visit.  Cardiac Risk Factors include: advanced age (>60men, >69 women)     Objective:    Today's Vitals   03/23/24 0852 03/23/24 0853  BP: 104/64   Pulse: 68   Temp: 98.7 F (37.1 C)   SpO2: 93%   Weight: 103 lb (46.7 kg)   Height: 5\' 5"  (1.651 m)   PainSc:  1    Body mass index is 17.14 kg/m.     03/23/2024    9:00 AM 11/13/2023   10:27 AM 11/19/2022    2:05 PM 03/28/2022    5:32 PM 03/27/2022    4:24 PM 02/27/2017    5:49 PM 08/27/2016    3:02 PM  Advanced Directives  Does Patient Have a Medical Advance Directive? Yes Yes Yes  No No Yes  Type of Estate agent of [place];Living will Healthcare Power of [place];Living will Healthcare Power of [place];Living will;Out of facility DNR (pink MOST or yellow form)      Does patient want to make changes to medical advance directive?  No - Patient declined       Copy of Healthcare Power of Attorney in Chart? No - copy requested No - copy requested No - copy requested      Would patient like information on creating a medical advance directive?   No - Patient declined No - Patient declined       Current Medications (verified) Outpatient Encounter Medications as of 03/23/2024  Medication Sig   CALCIUM CITRATE PO Take by mouth.   Cholecalciferol (VITAMIN D-3 PO) Take 40 mcg by mouth 2 (two) times daily.   cholestyramine (QUESTRAN) 4 g packet Take 4 g by mouth as needed (Diarrhea).   estradiol (ESTRACE) 0.1 MG/GM vaginal cream Place 1 Applicatorful vaginally 2 (two) times a week.   Ferrous Sulfate (IRON) 325 (65 Fe) MG TABS 1 tablet Orally Once a day   Lubricants (K-Y JELLY EX) Apply topically.   MAGNESIUM CITRATE PO Take 250 mg by mouth 2 (two) times  daily.   MAGNESIUM GLYCINATE PO Take 350 mg by mouth daily.   metroNIDAZOLE (METROGEL) 0.75 % vaginal gel Place 1 Applicatorful vaginally at bedtime. Use for 5 nights.   OVER THE COUNTER MEDICATION Mature multi   OVER THE COUNTER MEDICATION 10 g. Collagen peptides   OVER THE COUNTER MEDICATION Rogers Blocker milk (Turmeric), 1 tsp in oat milk   OVER THE COUNTER MEDICATION 2 (two) times daily. [place]ora Defend   OVER THE COUNTER MEDICATION Pro-Bifida 50+ ( probiotic)   OVER THE COUNTER MEDICATION daily. Wellness formula (immune support)   PEPPERMINT OIL PO Take by mouth as needed (digestion). Softgels   vitamin C (ASCORBIC ACID) 500 MG tablet Take 1,000 mg by mouth daily.   vitamin k 100 MCG tablet Take 100 mcg by mouth daily.   No facility-administered encounter medications on file as of 03/23/2024.    Allergies (verified) Lactose intolerance (gi) and Macrobid [nitrofurantoin]   History: Past Medical History:  Diagnosis Date   Hypoglycemia 07/30/2012   Hypokalemia 07/31/2012   Hyponatremia 07/30/2012   Low iron    Mild cognitive impairment with memory loss    Mitral valve prolapse    Neutropenia (HCC) 08/27/2016   Past Surgical History:  Procedure Laterality Date   TEAR DUCT PROBING     TOTAL HIP ARTHROPLASTY Left 03/27/2022   Procedure: LEFT POSTERIOR TOTAL HIP ARTHROPLASTY;  Surgeon: Joen Laura, MD;  Location: [facility] OR;  Service: Orthopedics;  Laterality: Left;   Family History  Problem Relation Age of Onset   Cancer Mother    Melanoma Mother    Diabetes Father    Breast cancer Neg Hx    Social History   Socioeconomic History   Marital status: Divorced    Spouse name: Not on file   Number of children: Not on file   Years of education: Not on file   Highest education level: Not on file  Occupational History   Occupation: retired  Tobacco Use   Smoking status: Never   Smokeless tobacco: Never  Vaping Use   Vaping status: Never Used  Substance and Sexual  Activity   Alcohol use: Not Currently    Comment: socially   Drug use: No   Sexual activity: Not Currently  Other Topics Concern   Not on file  Social History Narrative   Not on file   Social Drivers of Health   Financial Resource Strain: Low Risk  (03/23/2024)   Overall Financial Resource Strain (CARDIA)    Difficulty of Paying Living Expenses: Not hard at all  Food Insecurity: No Food Insecurity (03/23/2024)   Hunger Vital Sign    Worried About Running Out of Food in the Last Year: Never true    Ran Out of Food in the Last Year: Never true  Transportation Needs: No Transportation Needs (03/23/2024)   PRAPARE - Administrator, Civil Service (Medical): No    Lack of Transportation (Non-Medical): No  Physical Activity: Sufficiently Active (03/23/2024)   Exercise Vital Sign    Days of Exercise per Week: 7 days    Minutes of Exercise per Session: 60 min  Stress: No Stress Concern Present (03/23/2024)   [employer] of Occupational Health - Occupational Stress Questionnaire    Feeling of Stress : Not at all  Social Connections: Moderately Integrated (03/23/2024)   Social Connection and Isolation Panel [NHANES]    Frequency of Communication with Friends and Family: More than three times a week    Frequency of Social Gatherings with Friends and Family: More than three times a week    Attends Religious Services: More than 4 times per year    Active Member of [employer] or Organizations: Yes    Attends Banker Meetings: 1 to 4 times per year    Marital Status: Divorced    Tobacco Counseling Counseling given: Not Answered    Clinical Intake:  Pre-visit preparation completed: Yes  Pain : No/denies pain Pain Score: 1  (hips at times)     BMI - recorded: 17.14 Nutritional Status: BMI <19  Underweight Diabetes: No  Lab Results  Component Value Date   HGBA1C 5.2 07/31/2012     How often do you need to have someone help you when you read instructions,  pamphlets, or other written materials from your doctor or pharmacy?: 1 - Never  Interpreter Needed?: No  Information entered by :: Lanier Ensign, LPN   Activities of Daily Living     03/23/2024    8:55 AM  In your present state of health, do you have any difficulty performing the following activities:  Hearing? 1  Comment hearing aids  Vision? 0  Difficulty concentrating or making decisions? 0  Walking or climbing stairs?  0  Dressing or bathing? 0  Doing errands, shopping? 0  Preparing Food and eating ? N  Using the Toilet? N  In the past six months, have you accidently leaked urine? N  Do you have problems with loss of bowel control? N  Managing your Medications? N  Managing your Finances? N  Housekeeping or managing your Housekeeping? N    Patient Care Team: Dulce Sellar, NP as PCP - General (Family Medicine)  Indicate any recent Medical Services you may have received from other than [facility] providers in the past year (date may be approximate).     Assessment:   This is a routine wellness examination for [facility].  Hearing/Vision screen Hearing Screening - Comments:: Pt has hearing aids  Vision Screening - Comments:: Pt follows up with [facility] for annual eye exams    Goals Addressed             This Visit's Progress    Patient Stated       Continue with endurance and exercise        Depression Screen     03/23/2024    8:58 AM 08/21/2023    1:40 PM 12/23/2015    8:38 AM  PHQ 2/9 Scores  PHQ - 2 Score 0 0 0    Fall Risk     03/23/2024    9:00 AM 02/05/2024    9:48 AM  Fall Risk   Falls in the past year? 0 0  Number falls in past yr: 0 0  Injury with Fall? 0 0  Risk for fall due to : Impaired balance/gait;Impaired mobility No Fall Risks  Follow up Falls prevention discussed Falls evaluation completed;Education provided    MEDICARE RISK AT HOME:  Medicare Risk at Home Any stairs in or around the home?: No If so, are there any without  handrails?: No Home free of loose throw rugs in walkways, pet beds, electrical cords, etc?: Yes Adequate lighting in your home to reduce risk of falls?: Yes Life alert?: No Use of a cane, walker or w/c?: No Grab bars in the bathroom?: Yes Shower chair or bench in shower?: Yes Elevated toilet seat or a handicapped toilet?: Yes  TIMED UP AND GO:  Was the test performed?  No  Cognitive Function: 6CIT completed        03/23/2024    9:01 AM  6CIT Screen  What Year? 0 points  What month? 0 points  What time? 0 points  Count back from 20 0 points  Months in reverse 0 points  Repeat phrase 0 points  Total Score 0 points    Immunizations Immunization History  Administered Date(s) Administered   Fluad Trivalent(High Dose 65+) 09/16/2023   PFIZER(Purple Top)SARS-COV-2 Vaccination 01/27/2020, 02/21/2020   Pfizer Covid-19 Vaccine Bivalent Booster 37yrs & up 09/18/2020, 05/19/2021, 11/02/2021, 11/20/2022   Tdap 04/24/2020    Screening Tests Health Maintenance  Topic Date Due   Zoster Vaccines- Shingrix (1 of 2) 05/04/2024 (Originally 07/07/1997)   Pneumonia Vaccine 16+ Years old (1 of 1 - PCV) 08/27/2024 (Originally 07/07/2012)   Hepatitis C Screening  11/09/2024 (Originally 07/07/1964)   INFLUENZA VACCINE  07/23/2024   Medicare Annual Wellness (AWV)  03/23/2025   DTaP/Tdap/Td (2 - Td or Tdap) 04/24/2030   DEXA SCAN  Completed   HPV VACCINES  Aged Out   Colonoscopy  Discontinued   COVID-19 Vaccine  Discontinued    Health Maintenance  There are no preventive care reminders to display for this patient.  Health Maintenance Items Addressed: See Nurse Notes  Additional Screening:  Vision Screening: Recommended annual ophthalmology exams for early detection of glaucoma and other disorders of the eye.  Dental Screening: Recommended annual dental exams for proper oral hygiene  Community Resource Referral / Chronic Care Management: CRR required this visit?  No   CCM required  this visit?  No     Plan:     I have personally reviewed and noted the following in the patient's chart:   Medical and social history Use of alcohol, tobacco or illicit drugs  Current medications and supplements including opioid prescriptions. Patient is not currently taking opioid prescriptions. Functional ability and status Nutritional status Physical activity Advanced directives List of other physicians Hospitalizations, surgeries, and ER visits in previous 12 months Vitals Screenings to include cognitive, depression, and falls Referrals and appointments  In addition, I have reviewed and discussed with patient certain preventive protocols, quality metrics, and best practice recommendations. A written personalized care plan for preventive services as well as general preventive health recommendations were provided to patient.     Marzella Schlein, LPN   0/08/8118   After Visit Summary: (MyChart) Due to this being a telephonic visit, the after visit summary with patients personalized plan was offered to patient via MyChart   Notes: Nothing significant to report at this time.

## 2024-03-23 NOTE — Patient Instructions (Signed)
 Mackenzie Green , Thank you for taking time to come for your Medicare Wellness Visit. I appreciate your ongoing commitment to your health goals. Please review the following plan we discussed and let me know if I can assist you in the future.   Referrals/Orders/Follow-Ups/Clinician Recommendations: Aim for 30 minutes of exercise or brisk walking, 6-8 glasses of water, and 5 servings of fruits and vegetables each day.  This is a list of the screening recommended for you and due dates:  Health Maintenance  Topic Date Due   Medicare Annual Wellness Visit  01/29/2024   Zoster (Shingles) Vaccine (1 of 2) 05/04/2024*   Pneumonia Vaccine (1 of 1 - PCV) 08/27/2024*   Hepatitis C Screening  11/09/2024*   Flu Shot  07/23/2024   DTaP/Tdap/Td vaccine (2 - Td or Tdap) 04/24/2030   DEXA scan (bone density measurement)  Completed   HPV Vaccine  Aged Out   Colon Cancer Screening  Discontinued   COVID-19 Vaccine  Discontinued  *Topic was postponed. The date shown is not the original due date.    Advanced directives: (Copy Requested) Please bring a copy of your health care power of attorney and living will to the office to be added to your chart at your convenience. You can mail to [facility] [address] [place], [place] [address] or email to [email or web address]  Next Medicare Annual Wellness Visit scheduled for next year: Yes

## 2024-03-24 ENCOUNTER — Encounter: Payer: Self-pay | Admitting: Physical Therapy

## 2024-04-12 ENCOUNTER — Encounter: Payer: Self-pay | Admitting: Obstetrics and Gynecology

## 2024-04-12 NOTE — Progress Notes (Unsigned)
   Mackenzie Muck, PhD, LAT, ATC acting as a scribe for Mackenzie Juniper, MD.  Mackenzie Green is a 78 y.o. female who presents to [facility] today for f/u L hip and R Achilles pain. Pt was last seen by Dr. Alease Hunter on 03/09/24 and was given a L GT steroid injection and advised to cont off-loading. Pt also advised to use night splints and work on eccentric exercises.  Today, pt reports L hip is not painful, but she c/o stiffness and tightness. R Achilles is doing a little bit better.   She is traveling later this week for an important event.  She has osteoporosis.  She tried taking Fosamax in the past but it caused GI upset.  She is not currently on any specific medication.  She does take calcium and vitamin D .  Dx testing: 03/01/24 LE NCV study 03/27/22 L hip XR   Pertinent review of systems: No fevers or chills  Relevant historical information: IBS osteoporosis decreased BMI.  History of hip fracture.   Exam:  BP 102/66   Pulse 70   Ht 5\' 5"  (1.651 m)   Wt 105 lb (47.6 kg)   SpO2 98%   BMI 17.47 kg/m  General: Well Developed, well nourished, and in no acute distress.   MSK: Left hip normal motion decreased strength abduction.    Assessment and Plan: 78 y.o. female with persistent left lateral hip pain after a hip fracture and surgery.  She is already tried physical therapy which helped a little but not enough.  Talked about home exercises that she could be doing to help achieve her goals.  Next step in my end would be shockwave.  Consider repeat x-ray and three-phase bone scan or MRI in the future. We decided not to try first trial of shockwave today as she is traveling later this week and worried about making her hurt worse.  Osteoporosis: Spent a fair amount of time talking about osteoporosis.  In my opinion she should be taking medications for it.  She is already maximized her conservative management and her bone density is still quite poor and she  has had a hip fracture.  She is high risk for another fracture.  She is already tried Fosamax which she cannot tolerate.  Obvious neck step would be Prolia.  She would find Tymlos or Forteo to be too annoying to use and Evenity is too expensive for her.  She will think about it.  She think she is going to have another bone density test in August.  I think it is fine to wait until then but I do not expect her bone density to be dramatically better.  PDMP not reviewed this encounter. No orders of the defined types were placed in this encounter.  No orders of the defined types were placed in this encounter.    Discussed warning signs or symptoms. Please see discharge instructions. Patient expresses understanding.   The above documentation has been reviewed and is accurate and complete Mackenzie Green, M.D. Total encounter time 30 minutes including face-to-face time with the patient and, reviewing past medical record, and charting on the date of service.

## 2024-04-13 ENCOUNTER — Ambulatory Visit: Admitting: Family Medicine

## 2024-04-13 VITALS — BP 102/66 | HR 70 | Ht 65.0 in | Wt 105.0 lb

## 2024-04-13 DIAGNOSIS — M810 Age-related osteoporosis without current pathological fracture: Secondary | ICD-10-CM

## 2024-04-13 DIAGNOSIS — S72002S Fracture of unspecified part of neck of left femur, sequela: Secondary | ICD-10-CM

## 2024-04-13 DIAGNOSIS — M25559 Pain in unspecified hip: Secondary | ICD-10-CM

## 2024-04-13 NOTE — Patient Instructions (Addendum)
 Thank you for coming in today.   Please get labs today before you leave   Recheck in late August if all is well.   Let me know before you get labs with your PCP and I will make sure we get all the labs we need.   I can see you sooner if needed for shockwave or for injection or to do other tests.   Baclofen is a muscle spasm medicine we could use at night for cramps. It's sedating.

## 2024-04-19 ENCOUNTER — Other Ambulatory Visit: Payer: Self-pay | Admitting: Obstetrics and Gynecology

## 2024-04-19 ENCOUNTER — Encounter: Payer: Self-pay | Admitting: Family

## 2024-04-19 ENCOUNTER — Ambulatory Visit (INDEPENDENT_AMBULATORY_CARE_PROVIDER_SITE_OTHER): Admitting: Family

## 2024-04-19 VITALS — BP 118/76 | HR 70 | Temp 97.7°F | Wt 104.4 lb

## 2024-04-19 DIAGNOSIS — R252 Cramp and spasm: Secondary | ICD-10-CM | POA: Diagnosis not present

## 2024-04-19 MED ORDER — ESTRADIOL 0.1 MG/GM VA CREA: 1.0000 | TOPICAL_CREAM | VAGINAL | 11 refills | Status: AC

## 2024-04-19 NOTE — Progress Notes (Signed)
 Patient ID: Mackenzie Green, female    DOB: [birth year], 78 y.o.   MRN: [id number]  Chief Complaint  Patient presents with   Spasms    Leg and foot cramps  Waking her up at night and now it is happening in the day time as well   Discussed the use of AI scribe software for clinical note transcription with the patient, who gave verbal consent to proceed.  History of Present Illness Mackenzie Green, a patient with a history of hip surgery and poor nerve function in the legs, presents with persistent leg cramps. The cramps are primarily located in the calves and feet and have been disrupting her sleep and causing discomfort during long car trips. Despite trying various remedies, including electrolytes, stretches, mustard, and pickle juice, the patient has not found relief. The cramps often occur at night, after midnight, and during long car trips, particularly after about an hour of driving. The patient has had to delay trips and stop frequently to stretch and walk in an attempt to alleviate the discomfort. The patient also reports a sensation of tingling in her feet, which she believes may be related to the cramps. She has a history of low sodium and potassium levels, and a recent nerve study indicated poor nerve function in her legs. The patient is concerned about the potential causes of her symptoms and is seeking effective treatment.  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 Recent nerve study indicated poor nerve function, possibly due to previous surgery. Symptoms exacerbated by prolonged sitting and relieved by movement. Differential includes circulatory issues. Gabapentin suggested for nerve pain management, but concerns about side effects noted. - Order basic metabolic panel including kidney function and electrolytes. - Order magnesium level. - Consider starting gabapentin at night if symptoms  persist. - Advise use of compression socks during long trips. - Recommend heat therapy such as heating pad or Epsom salt bath.  Leg cramps Frequent nocturnal leg cramps affecting sleep and during day on long car trips. Symptoms improved with increased magnesium glycinate supplementation. Possible contribution from electrolyte imbalances. - Check BMP and magnesium level today. - Continue magnesium supplementation, 2 tablets at night. - Consider over-the-counter potassium supplementation if potassium levels are low or borderline low. - Advise stretching exercises and heat application prior to bedtime and continued hydration with 2 liters water  every day. - Monitor bowel function to avoid loose stools. - F/U prn    Subjective:    Outpatient Medications Prior to Visit  Medication Sig Dispense Refill   CALCIUM CITRATE PO Take by mouth.     Cholecalciferol  (VITAMIN D -3 PO) Take 40 mcg by mouth 2 (two) times daily.     cholestyramine (QUESTRAN) 4 g packet Take 4 g by mouth as needed (Diarrhea).     estradiol  (ESTRACE ) 0.1 MG/GM vaginal cream Place 1 Applicatorful vaginally 2 (two) times a week. 42.5 g 11   Ferrous Sulfate  (IRON) 325 (65 Fe) MG TABS 1 tablet Orally Once a day     Lubricants (K-Y JELLY EX) Apply topically.     MAGNESIUM CITRATE PO Take 250 mg by mouth 2 (two) times daily.     MAGNESIUM GLYCINATE PO Take 350 mg by mouth daily.     metroNIDAZOLE  (METROGEL ) 0.75 % vaginal gel Place 1 Applicatorful vaginally at bedtime. Use for 5 nights. 70 g 0   OVER THE COUNTER MEDICATION Mature multi  OVER THE COUNTER MEDICATION 10 g. Collagen peptides     OVER THE COUNTER MEDICATION Evon Hoguet milk (Turmeric), 1 tsp in oat milk     OVER THE COUNTER MEDICATION 2 (two) times daily. [place]ora Defend     OVER THE COUNTER MEDICATION Pro-Bifida 50+ ( probiotic)     OVER THE COUNTER MEDICATION daily. Wellness formula (immune support)     PEPPERMINT OIL PO Take by mouth as needed (digestion).  Softgels     vitamin C (ASCORBIC ACID ) 500 MG tablet Take 1,000 mg by mouth daily.     vitamin k 100 MCG tablet Take 100 mcg by mouth daily.     No facility-administered medications prior to visit.   Past Medical History:  Diagnosis Date   Hypoglycemia 07/30/2012   Hypokalemia 07/31/2012   Hyponatremia 07/30/2012   Low iron    Mild cognitive impairment with memory loss    Mitral valve prolapse    Neutropenia (HCC) 08/27/2016   Past Surgical History:  Procedure Laterality Date   TEAR DUCT PROBING     TOTAL HIP ARTHROPLASTY Left 03/27/2022   Procedure: LEFT POSTERIOR TOTAL HIP ARTHROPLASTY;  Surgeon: Murleen Arms, MD;  Location: [facility] OR;  Service: Orthopedics;  Laterality: Left;   Allergies  Allergen Reactions   Lactose Intolerance (Gi)     Milk    Macrobid  [Nitrofurantoin ] Rash      Objective:    Physical Exam Vitals and nursing note reviewed.  Constitutional:      Appearance: Normal appearance.  Cardiovascular:     Rate and Rhythm: Normal rate and regular rhythm.  Pulmonary:     Effort: Pulmonary effort is normal.     Breath sounds: Normal breath sounds.  Musculoskeletal:        General: Normal range of motion.  Skin:    General: Skin is warm and dry.  Neurological:     Mental Status: She is alert.  Psychiatric:        Mood and Affect: Mood normal.        Behavior: Behavior normal.    BP 118/76   Pulse 70   Temp 97.7 F (36.5 C)   Wt 104 lb 6.4 oz (47.4 kg)   SpO2 99%   BMI 17.37 kg/m  Wt Readings from Last 3 Encounters:  04/19/24 104 lb 6.4 oz (47.4 kg)  04/13/24 105 lb (47.6 kg)  03/23/24 103 lb (46.7 kg)       Versa Gore, NP

## 2024-04-20 LAB — BASIC METABOLIC PANEL WITH GFR
BUN: 19 mg/dL (ref 6–23)
CO2: 30 meq/L (ref 19–32)
Calcium: 8.9 mg/dL (ref 8.4–10.5)
Chloride: 102 meq/L (ref 96–112)
Creatinine, Ser: 0.7 mg/dL (ref 0.40–1.20)
GFR: 83.79 mL/min (ref >60.00)
Glucose, Bld: 117 mg/dL — ABNORMAL HIGH (ref 70–99)
Potassium: 4 meq/L (ref 3.5–5.1)
Sodium: 138 meq/L (ref 135–145)

## 2024-04-20 LAB — MAGNESIUM: Magnesium: 2 mg/dL (ref 1.5–2.5)

## 2024-04-21 ENCOUNTER — Encounter: Payer: Self-pay | Admitting: Family

## 2024-04-27 DIAGNOSIS — L638 Other alopecia areata: Secondary | ICD-10-CM | POA: Diagnosis not present

## 2024-04-27 DIAGNOSIS — C44729 Squamous cell carcinoma of skin of left lower limb, including hip: Secondary | ICD-10-CM | POA: Diagnosis not present

## 2024-04-27 DIAGNOSIS — Z85828 Personal history of other malignant neoplasm of skin: Secondary | ICD-10-CM | POA: Diagnosis not present

## 2024-05-04 ENCOUNTER — Ambulatory Visit: Payer: Medicare Other | Admitting: Obstetrics and Gynecology

## 2024-05-10 ENCOUNTER — Ambulatory Visit: Admitting: Obstetrics and Gynecology

## 2024-05-10 ENCOUNTER — Encounter: Payer: Self-pay | Admitting: Family

## 2024-05-10 ENCOUNTER — Encounter: Payer: Self-pay | Admitting: Obstetrics and Gynecology

## 2024-05-10 VITALS — BP 95/60 | HR 69

## 2024-05-10 DIAGNOSIS — N813 Complete uterovaginal prolapse: Secondary | ICD-10-CM | POA: Diagnosis not present

## 2024-05-10 DIAGNOSIS — R159 Full incontinence of feces: Secondary | ICD-10-CM

## 2024-05-10 DIAGNOSIS — N3941 Urge incontinence: Secondary | ICD-10-CM

## 2024-05-10 DIAGNOSIS — R152 Fecal urgency: Secondary | ICD-10-CM

## 2024-05-10 DIAGNOSIS — N812 Incomplete uterovaginal prolapse: Secondary | ICD-10-CM

## 2024-05-10 DIAGNOSIS — M810 Age-related osteoporosis without current pathological fracture: Secondary | ICD-10-CM

## 2024-05-10 NOTE — Patient Instructions (Addendum)
 [email or web address]bqsVNATV5TV8zZvgpzlLNSHgtL2oE6wcdG4noWf2nG_9VSosodMW8hYc3cj-RIZufg7D7vHD9OLN5HnxXttUJMbCtj7UH3lzDZyvCMeBLNNAq8gQiDnlyU8asV0XOCcMI359m5KZy80G2buoPVZJB0Pcz0_SPzqxGz7S9VJdsuqL7nvFgPjvUp_RmrAutYSF7f6z_j4Cbec8tZCwZ3m6vsQMRj8qLERh-gGH5w4eUhxId92PmgdszmT_69Y.GWXlIwPi1wGDj017eXYplUIXo0TT8qkjZC2VzIfRZys&dib_tag=se&keywords=biophix%2Bd-mannose%2Bcranberry%2Band%2Bprobiotics&[phone]&sprefix=d-mannose%2C%2Bcranberry%2C%2Bprobio%2Caps%2C101&sr=8-1-spons&sp_csd=d2lkZ2V0TmFtZT1zcF9hdGY&th=1   Here is a link to one of the supplements on amazon with the D-mannose, probiotic, and cranberry extract.

## 2024-05-10 NOTE — Progress Notes (Signed)
 [facility]   Subjective:      Chief Complaint:  Chief Complaint  Patient presents with   Follow-up    RICCA MELGAREJO is a 78 y.o. female is here for pessary check.   History of Present Illness: CHEALSEA PASKE is a 78 y.o. female with stage III pelvic organ prolapse who presents for a pessary check. She is using a size #1 cube pessary. The pessary has been working well and she has no complaints. She is using vaginal estrogen. She denies vaginal bleeding.  Past Medical History: Patient  has a past medical history of Hypoglycemia (07/30/2012), Hypokalemia (07/31/2012), Hyponatremia (07/30/2012), Low iron, Mild cognitive impairment with memory loss, Mitral valve prolapse, and Neutropenia (HCC) (08/27/2016).   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   Medications: She has a current medication list which includes the following prescription(s): calcium citrate, cholecalciferol , cholestyramine, estradiol , iron, lubricants, magnesium citrate, magnesium glycinate, metronidazole , OVER THE COUNTER MEDICATION, OVER THE COUNTER MEDICATION, OVER THE COUNTER MEDICATION, OVER THE COUNTER MEDICATION, OVER THE COUNTER MEDICATION, OVER THE COUNTER MEDICATION, peppermint oil, ascorbic acid , and vitamin k.   Allergies: Patient is allergic to lactose intolerance (gi) and macrobid  [nitrofurantoin ].   Social History: Patient  reports that she has never smoked. She has never used smokeless tobacco. She reports that she does not currently use alcohol. She reports that she does not use drugs.      Objective:     Physical Exam: BP 95/60   Pulse 69  Gen: No apparent distress, A&O x 3. Detailed Urogynecologic Evaluation:  Pelvic Exam: Normal external female genitalia; Bartholin's and Skene's glands normal in appearance; urethral meatus with caruncle, no urethral masses or discharge. The pessary  was noted to be in place. It was removed and cleaned. Speculum exam revealed no lesions in the vagina. The pessary was replaced. It was comfortable to the patient and fit well.    Assessment/Plan:    Assessment: Ms. Estorga is a 78 y.o. with stage III pelvic organ prolapse here for a pessary check. She is doing well.  Plan: She will remove nightly. She will continue to use estrogen. She will follow-up in 3 months for a pessary check or sooner as needed.

## 2024-05-19 ENCOUNTER — Other Ambulatory Visit: Payer: Self-pay | Admitting: Obstetrics and Gynecology

## 2024-05-20 ENCOUNTER — Encounter: Payer: Self-pay | Admitting: Obstetrics and Gynecology

## 2024-05-20 ENCOUNTER — Other Ambulatory Visit: Payer: Self-pay

## 2024-05-20 DIAGNOSIS — N898 Other specified noninflammatory disorders of vagina: Secondary | ICD-10-CM

## 2024-05-20 MED ORDER — METRONIDAZOLE 0.75 % VA GEL: 1.0000 | Freq: Every day | VAGINAL | 0 refills | Status: DC

## 2024-06-22 DIAGNOSIS — D1801 Hemangioma of skin and subcutaneous tissue: Secondary | ICD-10-CM | POA: Diagnosis not present

## 2024-06-22 DIAGNOSIS — L218 Other seborrheic dermatitis: Secondary | ICD-10-CM | POA: Diagnosis not present

## 2024-06-22 DIAGNOSIS — L821 Other seborrheic keratosis: Secondary | ICD-10-CM | POA: Diagnosis not present

## 2024-06-22 DIAGNOSIS — D2272 Melanocytic nevi of left lower limb, including hip: Secondary | ICD-10-CM | POA: Diagnosis not present

## 2024-06-22 DIAGNOSIS — D2262 Melanocytic nevi of left upper limb, including shoulder: Secondary | ICD-10-CM | POA: Diagnosis not present

## 2024-06-22 DIAGNOSIS — D225 Melanocytic nevi of trunk: Secondary | ICD-10-CM | POA: Diagnosis not present

## 2024-06-22 DIAGNOSIS — Z681 Body mass index (BMI) 19 or less, adult: Secondary | ICD-10-CM | POA: Diagnosis not present

## 2024-06-22 DIAGNOSIS — Z85828 Personal history of other malignant neoplasm of skin: Secondary | ICD-10-CM | POA: Diagnosis not present

## 2024-06-22 DIAGNOSIS — D2261 Melanocytic nevi of right upper limb, including shoulder: Secondary | ICD-10-CM | POA: Diagnosis not present

## 2024-06-22 DIAGNOSIS — L570 Actinic keratosis: Secondary | ICD-10-CM | POA: Diagnosis not present

## 2024-07-01 ENCOUNTER — Encounter: Payer: Self-pay | Admitting: Family

## 2024-07-01 DIAGNOSIS — K580 Irritable bowel syndrome with diarrhea: Secondary | ICD-10-CM

## 2024-07-01 NOTE — Telephone Encounter (Signed)
 Please see pt msg and advise on Rx

## 2024-07-02 MED ORDER — CHOLESTYRAMINE 4 G PO PACK: 4.0000 g | PACK | ORAL | 2 refills | Status: AC | PRN

## 2024-07-02 NOTE — Telephone Encounter (Signed)
 RX sent, let pt know, thx

## 2024-07-08 ENCOUNTER — Ambulatory Visit: Admitting: Family Medicine

## 2024-07-08 VITALS — BP 100/62 | HR 67 | Ht 65.0 in | Wt 107.0 lb

## 2024-07-08 DIAGNOSIS — M25559 Pain in unspecified hip: Secondary | ICD-10-CM | POA: Diagnosis not present

## 2024-07-08 NOTE — Progress Notes (Signed)
   LILLETTE Ileana Collet, PhD, LAT, ATC acting as a scribe for Artist Lloyd, MD.  Mackenzie Green is a 78 y.o. female who presents to [facility] at [facility] today for cont'd L hip pain and consult for ECSWT. Pt was last seen by Dr. Lloyd on 04/13/24 and was advised to cont HEP.  Today, pt reports she has cont'd to work on [employer] and stretches. Pain and stiffness remains on the lateral aspect of her L hip. Tylenol  and IBU are no longer helpful.   She is interested in proceeding to shockwave treatment now. Dx testing: 07/28/23 DEXA 03/01/24 LE NCV study 03/27/22 L hip XR  Pertinent review of systems: No fevers or chills  Relevant historical information: Left hip fracture with surgery   Exam:  BP 100/62   Pulse 67   Ht 5' 5 (1.651 m)   Wt 107 lb (48.5 kg)   SpO2 98%   BMI 17.81 kg/m  General: Well Developed, well nourished, and in no acute distress.   MSK: Left hip mature scar lateral hip.  Tender palpation posterior aspect of the greater trochanter.    Lab and Radiology Results                  Extracorporeal Shockwave Therapy Note    Patient is being treated today with ECSWT. Informed consent was obtained and patient tolerated procedure well.   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  Patient is failing typical conservative management.  Will proceed with shockwave treatment today.  No charge for today shockwave treatment is the office charge is for the consultation and discussion of it.  Recommend scheduling with my partner Dr. Leonce for the next 3 weeks for shockwave treatments 2-4   PDMP not reviewed this encounter. No orders of the defined types were placed in this encounter.  No orders of the defined  types were placed in this encounter.    Discussed warning signs or symptoms. Please see discharge instructions. Patient expresses understanding.   The above documentation has been reviewed and is accurate and complete Artist Lloyd, M.D.

## 2024-07-08 NOTE — Patient Instructions (Addendum)
 Thank you for coming in today.   Schedule with Dr Leonce for the next 3 weeks for shockwave.   Let me know if we should change it.

## 2024-07-11 ENCOUNTER — Encounter: Payer: Self-pay | Admitting: Family Medicine

## 2024-07-11 DIAGNOSIS — M810 Age-related osteoporosis without current pathological fracture: Secondary | ICD-10-CM

## 2024-07-12 NOTE — Telephone Encounter (Signed)
 Forwarding to Dr. Denyse Amass to review and advise.

## 2024-07-14 ENCOUNTER — Ambulatory Visit (INDEPENDENT_AMBULATORY_CARE_PROVIDER_SITE_OTHER): Payer: Self-pay | Admitting: Sports Medicine

## 2024-07-14 DIAGNOSIS — M25559 Pain in unspecified hip: Secondary | ICD-10-CM

## 2024-07-14 NOTE — Progress Notes (Signed)
   Morene Mace [facility] [address] [place] [address] Phone: [phone]   Extracorporeal Shockwave Therapy Note    Patient is being treated today with ECSWT. Informed consent was obtained and patient tolerated procedure well.   Therapy performed by Morene Mace  Condition treated: Hip abductor tendinopathy Treatment preset used: Tendinitis Energy used: 90 mJ Frequency used: 15 Hz Number of pulses: 2000 Head Size: medium Treatment #2 of #4  Electronically signed by:  Morene Mace [facility] 10:13 AM 07/14/24

## 2024-07-21 ENCOUNTER — Ambulatory Visit (INDEPENDENT_AMBULATORY_CARE_PROVIDER_SITE_OTHER): Payer: Self-pay | Admitting: Sports Medicine

## 2024-07-21 DIAGNOSIS — M25559 Pain in unspecified hip: Secondary | ICD-10-CM

## 2024-07-21 NOTE — Progress Notes (Signed)
   Morene Mace [facility] [address] [place] [address] Phone: [phone]   Extracorporeal Shockwave Therapy Note    Patient is being treated today with ECSWT. Informed consent was obtained and patient tolerated procedure well.  Patient previously was performing physical therapy.  Could consider repeat course of physical therapy.  Therapy performed by Morene Mace  Condition treated: Hip abductor tendinopathy Treatment preset used: Tendinitis Energy used: 90 mJ Frequency used: 15 Hz Number of pulses: 2000 Head Size: medium Treatment #3 of #4  Electronically signed by:  Morene Mace [facility] 9:17 AM 07/21/24

## 2024-07-26 ENCOUNTER — Ambulatory Visit (HOSPITAL_BASED_OUTPATIENT_CLINIC_OR_DEPARTMENT_OTHER)
Admission: RE | Admit: 2024-07-26 | Discharge: 2024-07-26 | Disposition: A | Discharge status: Home / Self Care | Source: Ambulatory Visit | Attending: Family | Admitting: Family

## 2024-07-26 DIAGNOSIS — M810 Age-related osteoporosis without current pathological fracture: Secondary | ICD-10-CM | POA: Diagnosis not present

## 2024-07-26 DIAGNOSIS — Z780 Asymptomatic menopausal state: Secondary | ICD-10-CM | POA: Diagnosis not present

## 2024-07-27 ENCOUNTER — Telehealth: Payer: Self-pay

## 2024-07-27 DIAGNOSIS — Z0000 Encounter for general adult medical examination without abnormal findings: Secondary | ICD-10-CM

## 2024-07-27 DIAGNOSIS — E559 Vitamin D deficiency, unspecified: Secondary | ICD-10-CM

## 2024-07-27 NOTE — Telephone Encounter (Signed)
 Copied from CRM [id number]. Topic: Clinical - Request for Lab/Test Order >> Jul 27, 2024  8:26 AM Mackenzie Green I wrote: Reason for CRM: Patient would like lab work to be ordered,so it can be reviewed at appointment on 8/11  Please advise

## 2024-07-28 ENCOUNTER — Ambulatory Visit (INDEPENDENT_AMBULATORY_CARE_PROVIDER_SITE_OTHER): Payer: Self-pay | Admitting: Sports Medicine

## 2024-07-28 DIAGNOSIS — M25559 Pain in unspecified hip: Secondary | ICD-10-CM

## 2024-07-28 NOTE — Progress Notes (Signed)
   Mackenzie Green [facility] [address] [place] [address] Phone: [phone]   Extracorporeal Shockwave Therapy Note    Patient is being treated today with ECSWT. Informed consent was obtained and patient tolerated procedure well.   Therapy performed by Mackenzie Green  Condition treated: Hip abductor tendinopathy Treatment preset used: Tendinitis Energy used: 90 mJ Frequency used: 15 hz Number of pulses: 2000 Head Size: Medium Treatment #4 of #4  Patient to follow-up with me in 2 to 3 weeks to discuss next steps in treatment plan  Electronically signed by:  Mackenzie Green [facility] 9:38 AM 07/28/24

## 2024-07-28 NOTE — Addendum Note (Signed)
 Addended by: NEYSA CLARIA SAILOR on: 07/28/2024 04:59 PM   Modules accepted: Orders

## 2024-07-28 NOTE — Telephone Encounter (Signed)
 order normal CPE labs and add Vitamin D , not sure if she wants something else, let me know, thx

## 2024-07-29 ENCOUNTER — Other Ambulatory Visit (INDEPENDENT_AMBULATORY_CARE_PROVIDER_SITE_OTHER)

## 2024-07-29 ENCOUNTER — Telehealth: Payer: Self-pay

## 2024-07-29 DIAGNOSIS — E559 Vitamin D deficiency, unspecified: Secondary | ICD-10-CM

## 2024-07-29 DIAGNOSIS — Z0000 Encounter for general adult medical examination without abnormal findings: Secondary | ICD-10-CM | POA: Diagnosis not present

## 2024-07-29 LAB — CBC WITH DIFFERENTIAL/PLATELET
Basophils Absolute: 0 K/uL (ref 0.0–0.1)
Basophils Relative: 0.7 % (ref 0.0–3.0)
Eosinophils Absolute: 0 K/uL (ref 0.0–0.7)
Eosinophils Relative: 0.8 % (ref 0.0–5.0)
HCT: 40.9 % (ref 36.0–46.0)
Hemoglobin: 13.6 g/dL (ref 12.0–15.0)
Lymphocytes Relative: 26.3 % (ref 12.0–46.0)
Lymphs Abs: 1 K/uL (ref 0.7–4.0)
MCHC: 33.3 g/dL (ref 30.0–36.0)
MCV: 100.3 fl — ABNORMAL HIGH (ref 78.0–100.0)
Monocytes Absolute: 0.3 K/uL (ref 0.1–1.0)
Monocytes Relative: 7.4 % (ref 3.0–12.0)
Neutro Abs: 2.4 K/uL (ref 1.4–7.7)
Neutrophils Relative %: 64.8 % (ref 43.0–77.0)
Platelets: 202 K/uL (ref 150.0–400.0)
RBC: 4.08 Mil/uL (ref 3.87–5.11)
RDW: 12.5 % (ref 11.5–15.5)
WBC: 3.7 K/uL — ABNORMAL LOW (ref 4.0–10.5)

## 2024-07-29 LAB — LIPID PANEL
Cholesterol: 148 mg/dL (ref 0–200)
HDL: 62.5 mg/dL (ref >39.00)
LDL Cholesterol: 62 mg/dL (ref 0–99)
NonHDL: 85.68
Total CHOL/HDL Ratio: 2
Triglycerides: 118 mg/dL (ref 0.0–149.0)
VLDL: 23.6 mg/dL (ref 0.0–40.0)

## 2024-07-29 LAB — COMPREHENSIVE METABOLIC PANEL WITH GFR
ALT: 13 U/L (ref 0–35)
AST: 17 U/L (ref 0–37)
Albumin: 4.1 g/dL (ref 3.5–5.2)
Alkaline Phosphatase: 54 U/L (ref 39–117)
BUN: 15 mg/dL (ref 6–23)
CO2: 27 meq/L (ref 19–32)
Calcium: 9.2 mg/dL (ref 8.4–10.5)
Chloride: 102 meq/L (ref 96–112)
Creatinine, Ser: 0.78 mg/dL (ref 0.40–1.20)
GFR: 73.45 mL/min (ref >60.00)
Glucose, Bld: 52 mg/dL — ABNORMAL LOW (ref 70–99)
Potassium: 3.6 meq/L (ref 3.5–5.1)
Sodium: 141 meq/L (ref 135–145)
Total Bilirubin: 0.5 mg/dL (ref 0.2–1.2)
Total Protein: 6.2 g/dL (ref 6.0–8.3)

## 2024-07-29 LAB — VITAMIN D 25 HYDROXY (VIT D DEFICIENCY, FRACTURES): VITD: 102.74 ng/mL (ref 30.00–100.00)

## 2024-07-29 LAB — TSH: TSH: 1.78 u[IU]/mL (ref 0.35–5.50)

## 2024-07-29 NOTE — Telephone Encounter (Addendum)
 CRITICAL VALUE STICKER  CRITICAL VALUE: vitamin D  :102.74  RECEIVER (on-site recipient of call): Claria Salt, CMA DATE & TIME NOTIFIED: 08/07 @ 2:59 pm  MESSENGER (representative from lab): Saa from [facility]   MD NOTIFIED: Corean Comment, NP  TIME OF NOTIFICATION: 3:01 PM   RESPONSE:  Will Review

## 2024-08-02 ENCOUNTER — Encounter: Payer: Self-pay | Admitting: Family

## 2024-08-02 ENCOUNTER — Ambulatory Visit (INDEPENDENT_AMBULATORY_CARE_PROVIDER_SITE_OTHER): Admitting: Family

## 2024-08-02 VITALS — BP 106/71 | HR 72 | Temp 97.5°F | Ht 65.0 in | Wt 104.4 lb

## 2024-08-02 DIAGNOSIS — M8589 Other specified disorders of bone density and structure, multiple sites: Secondary | ICD-10-CM | POA: Insufficient documentation

## 2024-08-02 DIAGNOSIS — M810 Age-related osteoporosis without current pathological fracture: Secondary | ICD-10-CM

## 2024-08-02 DIAGNOSIS — K580 Irritable bowel syndrome with diarrhea: Secondary | ICD-10-CM

## 2024-08-02 DIAGNOSIS — M25559 Pain in unspecified hip: Secondary | ICD-10-CM | POA: Diagnosis not present

## 2024-08-02 NOTE — Assessment & Plan Note (Signed)
 Reviewed CMP and CBC w/diff results today. Mildly low Glucose despite eating, asymptomatic. CBC w/mildly low WBC, discussed possible causes including recent allergy or viral sx. IBS sx under control currently, following with GI. - Continue to follow with GI for symptom management

## 2024-08-02 NOTE — Assessment & Plan Note (Addendum)
 Left forearm (radius) - -4.3, worsened from -3.4 Prefers supplements and lifestyle modifications over prescription medications.  - Continue calcium and vitamin D  supplementation, not exceeding 4000 units of vitamin D  daily. - Continue vitamin K, collagen peptides, turmeric, and probiotics. - Continue weight-bearing exercises. - Consider reducing vitamin D  supplementation to one capsule per day. - F/U in 1 year for bone density - F/U in 3-48m for Vit D recheck

## 2024-08-02 NOTE — Progress Notes (Signed)
 Patient ID: Mackenzie Green, female    DOB: Jan 30, 1946, 78 y.o.   MRN: [id number]  Chief Complaint  Patient presents with   Annual Exam    Review Lab results  Discussed the use of AI scribe software for clinical note transcription with the patient, who gave verbal consent to proceed.  History of Present Illness Mackenzie Green is a 78 year old female with osteoporosis who presents for follow-up on bone density results and medication management.  Bone mineral density abnormalities - Recent bone density scans show mild improvement in the spine, worsening in the left forearm, and stable femoral neck results - Uncertainty regarding whether a total hip scan was performed - Has all previous bone density scan results with her  Bisphosphonate therapy adverse effects - History of taking Fosamax with SE, did not tolerate - Experiences gastrointestinal upset with Fosamax use  Concerns regarding osteoporosis pharmacotherapy - Has not taken Prolia due to concerns about prescription medication cost and possible SE  Nutritional supplementation - Currently taking calcium and vitamin D  supplements, unsure of exact amount - Vitamin D  levels are elevated; considering adjustment of intake - Also taking vitamin K, collagen peptides, turmeric, and probiotics  Hip pain and scar tissue - Undergoing shock wave therapy for left hip scar tissue for several tx - Initial improvement with therapy, but recent sessions provide less relief - No imaging studies such as MRI have been performed  Glycemic variability - Recent episode of low blood sugar despite usual breakfast - No symptoms of weakness or shakiness during hypoglycemic episode  Calcium homeostasis - Calcium levels are stable - History of low calcium  Assessment & Plan 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 Persistent left hip pain with  suspected scar tissue. Initial improvement with shock wave therapy, recent sessions less effective. Prefers supplements and lifestyle modifications over prescription medications. Considering MRI for scar tissue evaluation, but cost and insurance are concerns. - Continue calcium and vitamin D  supplementation, not exceeding 4000 units of vitamin D  daily. - Continue vitamin K, collagen peptides, turmeric, and probiotics. - Continue weight-bearing exercises. - Discuss MRI for left hip scar tissue with Dr. Leonce. - Follow up with ORtho on shock wave therapy effectiveness. - Consider reducing vitamin D  supplementation to one capsule per day.  IBS- Reviewed CMP and CBC w/diff results today. Mildly low Glucose despite eating, asymptomatic. CBC w/mildly low WBC, discussed possible causes including recent allergy or viral sx. IBS sx under control currently, following with GI. - Continue to follow with GI for symptom management  Vitamin D  levels slightly elevated from supplementation. Current intake is 3200 units daily, plus unknown additional from multivitamin and calcium supplement. Advised not to exceed 4000 units daily to prevent accumulation. - Reduce vitamin D  supplementation to ensure total daily intake does not exceed 4000 units or . - Consider taking one vitamin D  capsule per day instead of bid and adjust calcium supplement accordingly.  Subjective:    Outpatient Medications Prior to Visit  Medication Sig Dispense Refill   CALCIUM CITRATE PO Take by mouth.     Cholecalciferol  (VITAMIN D -3 PO) Take 40 mcg by mouth 2 (two) times daily.     cholestyramine  (QUESTRAN ) 4 g packet Take 1 packet (4 g total) by mouth as needed (Diarrhea). Mix 1 packet with water  or non-carbonated drink and take two times a day as needed. 30 each 2   clobetasol   cream (TEMOVATE ) 0.05 % Apply topically.     estradiol  (ESTRACE ) 0.1 MG/GM vaginal cream Place 1 Applicatorful vaginally 2 (two) times a week. 42.5 g 11    Ferrous Sulfate  (IRON) 325 (65 Fe) MG TABS 1 tablet Orally Once a day     Lubricants (K-Y JELLY EX) Apply topically.     MAGNESIUM CITRATE PO Take 250 mg by mouth 2 (two) times daily.     MAGNESIUM GLYCINATE PO Take 350 mg by mouth daily.     metroNIDAZOLE  (METROGEL ) 0.75 % vaginal gel Place 1 Applicatorful vaginally at bedtime. Use for 5 nights. 70 g 0   OVER THE COUNTER MEDICATION Mature multi     OVER THE COUNTER MEDICATION 10 g. Collagen peptides     OVER THE COUNTER MEDICATION Kit Childs milk (Turmeric), 1 tsp in oat milk     OVER THE COUNTER MEDICATION 2 (two) times daily. [place]quora Defend     OVER THE COUNTER MEDICATION Pro-Bifida 50+ ( probiotic)     OVER THE COUNTER MEDICATION daily. Wellness formula (immune support)     PEPPERMINT OIL PO Take by mouth as needed (digestion). Softgels     vitamin C (ASCORBIC ACID ) 500 MG tablet Take 1,000 mg by mouth daily.     vitamin k 100 MCG tablet Take 100 mcg by mouth daily.     No facility-administered medications prior to visit.   Past Medical History:  Diagnosis Date   Hypoglycemia 07/30/2012   Hypokalemia 07/31/2012   Hyponatremia 07/30/2012   Low iron    Mild cognitive impairment with memory loss    Mitral valve prolapse    Neutropenia (HCC) 08/27/2016   Past Surgical History:  Procedure Laterality Date   TEAR DUCT PROBING     TOTAL HIP ARTHROPLASTY Left 03/27/2022   Procedure: LEFT POSTERIOR TOTAL HIP ARTHROPLASTY;  Surgeon: Edna Toribio LABOR, MD;  Location: [facility] OR;  Service: Orthopedics;  Laterality: Left;   Allergies  Allergen Reactions   Lactose Intolerance (Gi)     Milk    Macrobid  [Nitrofurantoin ] Rash      Objective:    Physical Exam Vitals and nursing note reviewed.  Constitutional:      Appearance: Normal appearance.  Cardiovascular:     Rate and Rhythm: Normal rate and regular rhythm.  Pulmonary:     Effort: Pulmonary effort is normal.     Breath sounds: Normal breath sounds.  Musculoskeletal:         General: Normal range of motion.  Skin:    General: Skin is warm and dry.  Neurological:     Mental Status: She is alert.  Psychiatric:        Mood and Affect: Mood normal.        Behavior: Behavior normal.    BP 106/71 (BP Location: Left Arm, Patient Position: Sitting, Cuff Size: Normal)   Pulse 72   Temp (!) 97.5 F (36.4 C)   Wt 104 lb 6.4 oz (47.4 kg)   SpO2 99%   BMI 17.37 kg/m  Wt Readings from Last 3 Encounters:  08/02/24 104 lb 6.4 oz (47.4 kg)  07/08/24 107 lb (48.5 kg)  04/19/24 104 lb 6.4 oz (47.4 kg)   *I personally spent a total of 40 minutes in the care of the patient today including preparing to see the patient, getting/reviewing separately obtained history, performing a medically appropriate exam/evaluation, counseling and educating, placing orders, documenting clinical information in the EHR, independently interpreting results, and communicating results.  Lucius Krabbe, NP

## 2024-08-02 NOTE — Assessment & Plan Note (Addendum)
 Lumbar spine - improved to -1.3 to -1.2.  Right femoral neck -2.1, Total R Hip -2.3 Osteopenia with improved spine density but worsening in left forearm. Persistent left hip pain with suspected scar tissue. Initial improvement with shock wave therapy, recent sessions less effective. Considering MRI for scar tissue evaluation, but cost and insurance are concerns. - Discuss possible MRI to assess left hip scar tissue with Dr. Leonce (Ortho) - Follow up with ORtho on shock wave therapy effectiveness. - Continue calcium and vitamin D  supplementation, not exceeding 4000 units of vitamin D  daily. - Continue vitamin K, collagen peptides, turmeric, and probiotics. - Continue weight-bearing exercises.

## 2024-08-06 ENCOUNTER — Ambulatory Visit (INDEPENDENT_AMBULATORY_CARE_PROVIDER_SITE_OTHER): Admitting: Family

## 2024-08-06 ENCOUNTER — Encounter: Payer: Self-pay | Admitting: Family

## 2024-08-06 VITALS — BP 111/68 | HR 73 | Temp 97.3°F | Ht 65.0 in | Wt 107.2 lb

## 2024-08-06 DIAGNOSIS — N3090 Cystitis, unspecified without hematuria: Secondary | ICD-10-CM | POA: Diagnosis not present

## 2024-08-06 LAB — POCT URINALYSIS DIPSTICK
Bilirubin, UA: POSITIVE — AB
Blood, UA: POSITIVE — AB
Glucose, UA: NEGATIVE
Ketones, UA: POSITIVE — AB
Nitrite, UA: POSITIVE
Protein, UA: POSITIVE — AB
Spec Grav, UA: 1.025 (ref 1.010–1.025)
Urobilinogen, UA: 1 U/dL
pH, UA: 6 (ref 5.0–8.0)

## 2024-08-06 MED ORDER — SULFAMETHOXAZOLE-TRIMETHOPRIM 800-160 MG PO TABS: 1.0000 | ORAL_TABLET | Freq: Two times a day (BID) | ORAL | 0 refills | Status: DC

## 2024-08-06 NOTE — Progress Notes (Signed)
 Patient ID: Mackenzie Green, female    DOB: 1946/03/02, 78 y.o.   MRN: [id number]  Chief Complaint  Patient presents with   Dysuria    Pt c/o dysuria and urinary frequency. Present since Tuesday, Has tried AZO.  Discussed the use of AI scribe software for clinical note transcription with the patient, who gave verbal consent to proceed.  History of Present Illness Mackenzie Green is a 78 year old female who presents with recurrent urinary tract infections.  Recurrent urinary tract infections - Multiple episodes of urinary tract infections, with increased frequency since surgery and diaper use at a facility - Notable episode in October 2024 involving an allergic reaction to an antibiotic - February 2025 urine culture confirmed E. coli infection; treated with Bactrim , which caused diarrhea - Home urine test on Tuesday was positive for UTI - Concerned about recurrence; uses Azo, cranberry juice, and supplements for prevention  Vaginal and vulvar symptoms - Symptoms of bacterial vaginitis, including itching, sometimes confused with UTI symptoms  Pessary use and vaginal estrogen therapy - Pessary in place for one year; cleans daily - Uses estrogen cream twice weekly due to pessary use - Considering removing pessary during recovery from current UTI  Assessment & Plan Recurrent urinary tract infections Recurrent UTIs with E. Coli, concerns about Bactrim  and Macrobid  resistance. Pessary use may contribute to infections. Bacterial vaginitis and yeast infections complicate symptom differentiation. - Prescribe Bactrim  for 5 days, take after eating. - Send urine sample for culture. - Advise to start Bactrim  immediately for symptom relief. - Consider increasing estrogen cream application to three times a week for prevention. - Advise to remove pessary during UTI treatment. - Increase water  intake to 2L daily. - Call back if sx are not improved   Subjective:    Outpatient Medications Prior  to Visit  Medication Sig Dispense Refill   CALCIUM CITRATE PO Take by mouth.     Cholecalciferol  (VITAMIN D -3 PO) Take 40 mcg by mouth 2 (two) times daily.     cholestyramine  (QUESTRAN ) 4 g packet Take 1 packet (4 g total) by mouth as needed (Diarrhea). Mix 1 packet with water  or non-carbonated drink and take two times a day as needed. 30 each 2   clobetasol  cream (TEMOVATE ) 0.05 % Apply topically.     estradiol  (ESTRACE ) 0.1 MG/GM vaginal cream Place 1 Applicatorful vaginally 2 (two) times a week. 42.5 g 11   Ferrous Sulfate  (IRON) 325 (65 Fe) MG TABS 1 tablet Orally Once a day     Lubricants (K-Y JELLY EX) Apply topically.     MAGNESIUM CITRATE PO Take 250 mg by mouth 2 (two) times daily.     MAGNESIUM GLYCINATE PO Take 350 mg by mouth daily.     metroNIDAZOLE  (METROGEL ) 0.75 % vaginal gel Place 1 Applicatorful vaginally at bedtime. Use for 5 nights. 70 g 0   OVER THE COUNTER MEDICATION Mature multi     OVER THE COUNTER MEDICATION 10 g. Collagen peptides     OVER THE COUNTER MEDICATION Kit Childs milk (Turmeric), 1 tsp in oat milk     OVER THE COUNTER MEDICATION 2 (two) times daily. Uquora Defend     OVER THE COUNTER MEDICATION Pro-Bifida 50+ ( probiotic)     OVER THE COUNTER MEDICATION daily. Wellness formula (immune support)     PEPPERMINT OIL PO Take by mouth as needed (digestion). Softgels     vitamin C (ASCORBIC ACID ) 500 MG tablet Take 1,000 mg by mouth daily.  vitamin k 100 MCG tablet Take 100 mcg by mouth daily.     No facility-administered medications prior to visit.   Past Medical History:  Diagnosis Date   Hypoglycemia 07/30/2012   Hypokalemia 07/31/2012   Hyponatremia 07/30/2012   Low iron    Mild cognitive impairment with memory loss    Mitral valve prolapse    Neutropenia (HCC) 08/27/2016   Past Surgical History:  Procedure Laterality Date   TEAR DUCT PROBING     TOTAL HIP ARTHROPLASTY Left 03/27/2022   Procedure: LEFT POSTERIOR TOTAL HIP ARTHROPLASTY;   Surgeon: Edna Toribio LABOR, MD;  Location: [facility] OR;  Service: Orthopedics;  Laterality: Left;   Allergies  Allergen Reactions   Lactose Intolerance (Gi)     Milk    Macrobid  [Nitrofurantoin ] Rash      Objective:    Physical Exam Vitals and nursing note reviewed.  Constitutional:      Appearance: Normal appearance.  Cardiovascular:     Rate and Rhythm: Normal rate and regular rhythm.  Pulmonary:     Effort: Pulmonary effort is normal.     Breath sounds: Normal breath sounds.  Musculoskeletal:        General: Normal range of motion.  Skin:    General: Skin is warm and dry.  Neurological:     Mental Status: She is alert.  Psychiatric:        Mood and Affect: Mood normal.        Behavior: Behavior normal.    BP 111/68 (BP Location: Left Arm, Patient Position: Sitting, Cuff Size: Normal)   Pulse 73   Temp (!) 97.3 F (36.3 C) (Temporal)   Ht 5' 5 (1.651 m)   Wt 107 lb 3.2 oz (48.6 kg)   SpO2 95%   BMI 17.84 kg/m  Wt Readings from Last 3 Encounters:  08/06/24 107 lb 3.2 oz (48.6 kg)  08/02/24 104 lb 6.4 oz (47.4 kg)  07/08/24 107 lb (48.5 kg)      Mackenzie Krabbe, NP

## 2024-08-08 LAB — URINE CULTURE
MICRO NUMBER:: 16838232
SPECIMEN QUALITY:: ADEQUATE

## 2024-08-09 ENCOUNTER — Ambulatory Visit: Payer: Self-pay | Admitting: Family

## 2024-08-10 ENCOUNTER — Other Ambulatory Visit (HOSPITAL_COMMUNITY)
Admission: RE | Admit: 2024-08-10 | Discharge: 2024-08-10 | Disposition: A | Discharge status: Home / Self Care | Source: Ambulatory Visit | Attending: Obstetrics and Gynecology | Admitting: Obstetrics and Gynecology

## 2024-08-10 ENCOUNTER — Ambulatory Visit: Admitting: Obstetrics and Gynecology

## 2024-08-10 ENCOUNTER — Other Ambulatory Visit: Payer: Self-pay | Admitting: Obstetrics and Gynecology

## 2024-08-10 ENCOUNTER — Encounter: Payer: Self-pay | Admitting: Obstetrics and Gynecology

## 2024-08-10 VITALS — BP 125/73 | HR 74

## 2024-08-10 DIAGNOSIS — N390 Urinary tract infection, site not specified: Secondary | ICD-10-CM

## 2024-08-10 DIAGNOSIS — N813 Complete uterovaginal prolapse: Secondary | ICD-10-CM | POA: Diagnosis not present

## 2024-08-10 DIAGNOSIS — Z87440 Personal history of urinary (tract) infections: Secondary | ICD-10-CM | POA: Diagnosis not present

## 2024-08-10 DIAGNOSIS — N898 Other specified noninflammatory disorders of vagina: Secondary | ICD-10-CM

## 2024-08-10 DIAGNOSIS — N362 Urethral caruncle: Secondary | ICD-10-CM | POA: Diagnosis not present

## 2024-08-10 DIAGNOSIS — N812 Incomplete uterovaginal prolapse: Secondary | ICD-10-CM

## 2024-08-10 MED ORDER — METHENAMINE HIPPURATE 1 G PO TABS: 1.0000 g | ORAL_TABLET | Freq: Two times a day (BID) | ORAL | 2 refills | Status: DC

## 2024-08-10 NOTE — Progress Notes (Signed)
 [facility]   Subjective:     Chief Complaint:  Chief Complaint  Patient presents with   Pessary Check    CLYTIE SHETLEY is a 78 y.o. female is here for pessary check/cleaning.   History of Present Illness: SHANTANIQUE HODO is a 78 y.o. female with stage III pelvic organ prolapse who presents for a pessary check. She is using a size #1 cube pessary. The pessary has been working okay, but patient reports vaginal discharge and another UTI (E.coli) for which she is being treated. She is using vaginal estrogen. She denies vaginal bleeding.  Past Medical History: Patient  has a past medical history of Hypoglycemia (07/30/2012), Hypokalemia (07/31/2012), Hyponatremia (07/30/2012), Low iron, Mild cognitive impairment with memory loss, Mitral valve prolapse, and Neutropenia (HCC) (08/27/2016).   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   Medications: She has a current medication list which includes the following prescription(s): calcium citrate, cholecalciferol , cholestyramine , clobetasol  cream, estradiol , iron, lubricants, magnesium citrate, magnesium glycinate, methenamine , metronidazole , OVER THE COUNTER MEDICATION, OVER THE COUNTER MEDICATION, OVER THE COUNTER MEDICATION, OVER THE COUNTER MEDICATION, OVER THE COUNTER MEDICATION, OVER THE COUNTER MEDICATION, peppermint oil, sulfamethoxazole -trimethoprim , ascorbic acid , and vitamin k.   Allergies: Patient is allergic to lactose intolerance (gi) and macrobid  [nitrofurantoin ].   Social History: Patient  reports that she has never smoked. She has never used smokeless tobacco. She reports that she does not currently use alcohol. She reports that she does not use drugs.      Objective:    Physical Exam: BP 125/73   Pulse 74  Gen: No apparent distress, A&O x 3. Detailed Urogynecologic Evaluation:  Pelvic Exam: Normal external female  genitalia; Bartholin's and Skene's glands normal in appearance; urethral meatus with caruncle, no urethral masses or discharge. The pessary was not in place as she removes it on her own. Speculum exam revealed no lesions in the vagina but there was a light green discharge at the vaginal cuff. Aptima swab obtained.    Assessment/Plan:    Assessment: Ms. Riggenbach is a 78 y.o. with stage III pelvic organ prolapse here for a pessary check. She is doing well.  Plan: She will remove nightly. She will continue to use estrogen. She will follow-up in 3 months for a pessary check or sooner as needed.   We discussed options for surgery for her to consider that would allow her to keep her uterus including a hysteropexy and colpocleisis. She is considering if a surgical option would be her best option in the future. Information given.   Patient to increase her estrogen use to x3 weekly to support urethral caruncle and try to reduce risk of UTI.   Will start patient on Methenamine  1g x2 daily for recurrent UTI prevention.

## 2024-08-10 NOTE — Progress Notes (Unsigned)
    Ben Jackson D.[facility] [address] [place] [address] Phone: [phone]   Assessment and Plan:     There are no diagnoses linked to this encounter.  ***   Pertinent previous records reviewed include ***    Follow Up: ***     Subjective:    Chief Complaint: ***  HPI:   08/11/24 Patient states   Relevant Historical Information: ***  Additional pertinent review of systems negative.   Current Outpatient Medications:    CALCIUM CITRATE PO, Take by mouth., Disp: , Rfl:    Cholecalciferol  (VITAMIN D -3 PO), Take 40 mcg by mouth 2 (two) times daily., Disp: , Rfl:    cholestyramine  (QUESTRAN ) 4 g packet, Take 1 packet (4 g total) by mouth as needed (Diarrhea). Mix 1 packet with water  or non-carbonated drink and take two times a day as needed., Disp: 30 each, Rfl: 2   clobetasol  cream (TEMOVATE ) 0.05 %, Apply topically., Disp: , Rfl:    estradiol  (ESTRACE ) 0.1 MG/GM vaginal cream, Place 1 Applicatorful vaginally 2 (two) times a week., Disp: 42.5 g, Rfl: 11   Ferrous Sulfate  (IRON) 325 (65 Fe) MG TABS, 1 tablet Orally Once a day, Disp: , Rfl:    Lubricants (K-Y JELLY EX), Apply topically., Disp: , Rfl:    MAGNESIUM CITRATE PO, Take 250 mg by mouth 2 (two) times daily., Disp: , Rfl:    MAGNESIUM GLYCINATE PO, Take 350 mg by mouth daily., Disp: , Rfl:    metroNIDAZOLE  (METROGEL ) 0.75 % vaginal gel, Place 1 Applicatorful vaginally at bedtime. Use for 5 nights., Disp: 70 g, Rfl: 0   OVER THE COUNTER MEDICATION, Mature multi, Disp: , Rfl:    OVER THE COUNTER MEDICATION, 10 g. Collagen peptides, Disp: , Rfl:    OVER THE COUNTER MEDICATION, Gaia Golden milk (Turmeric), 1 tsp in oat milk, Disp: , Rfl:    OVER THE COUNTER MEDICATION, 2 (two) times daily. Irvin Defend, Disp: , Rfl:    OVER THE COUNTER MEDICATION, Pro-Bifida 50+ ( probiotic), Disp: , Rfl:    OVER THE COUNTER MEDICATION, daily. Wellness formula (immune support), Disp: , Rfl:     PEPPERMINT OIL PO, Take by mouth as needed (digestion). Softgels, Disp: , Rfl:    sulfamethoxazole -trimethoprim  (BACTRIM  DS) 800-160 MG tablet, Take 1 tablet by mouth 2 (two) times daily after a meal., Disp: 10 tablet, Rfl: 0   vitamin C (ASCORBIC ACID ) 500 MG tablet, Take 1,000 mg by mouth daily., Disp: , Rfl:    vitamin k 100 MCG tablet, Take 100 mcg by mouth daily., Disp: , Rfl:    Objective:     There were no vitals filed for this visit.    There is no height or weight on file to calculate BMI.    Physical Exam:    ***   Electronically signed by:  Odis Mace D[facility] 3:21 PM 08/10/24

## 2024-08-10 NOTE — Patient Instructions (Signed)
 Cetaphil or dove soap is suggested for cleaning the pessary.

## 2024-08-11 ENCOUNTER — Ambulatory Visit: Admitting: Sports Medicine

## 2024-08-11 VITALS — BP 110/60 | HR 74 | Ht 65.0 in

## 2024-08-11 DIAGNOSIS — G8929 Other chronic pain: Secondary | ICD-10-CM | POA: Diagnosis not present

## 2024-08-11 DIAGNOSIS — M25552 Pain in left hip: Secondary | ICD-10-CM | POA: Diagnosis not present

## 2024-08-11 DIAGNOSIS — M25559 Pain in unspecified hip: Secondary | ICD-10-CM

## 2024-08-11 LAB — CERVICOVAGINAL ANCILLARY ONLY
Bacterial Vaginitis (gardnerella): POSITIVE — AB
Candida Glabrata: NEGATIVE
Candida Vaginitis: NEGATIVE
Comment: NEGATIVE
Comment: NEGATIVE
Comment: NEGATIVE

## 2024-08-11 NOTE — Patient Instructions (Addendum)
 Good to see you  Continue home exercises Tylenol  [phone] mg 2-3 times a day for pain relief  Topical Voltaren gel over areas of pain Follow up in September before your trip for a steroid injection

## 2024-08-12 ENCOUNTER — Other Ambulatory Visit: Payer: Self-pay | Admitting: Obstetrics and Gynecology

## 2024-08-12 ENCOUNTER — Ambulatory Visit: Payer: Self-pay

## 2024-08-12 DIAGNOSIS — N898 Other specified noninflammatory disorders of vagina: Secondary | ICD-10-CM

## 2024-08-12 MED ORDER — METRONIDAZOLE 0.75 % VA GEL: 1.0000 | Freq: Every day | VAGINAL | 0 refills | Status: AC

## 2024-08-17 DIAGNOSIS — K638219 Small intestinal bacterial overgrowth, unspecified: Secondary | ICD-10-CM | POA: Diagnosis not present

## 2024-08-17 DIAGNOSIS — R197 Diarrhea, unspecified: Secondary | ICD-10-CM | POA: Diagnosis not present

## 2024-08-24 ENCOUNTER — Ambulatory Visit (INDEPENDENT_AMBULATORY_CARE_PROVIDER_SITE_OTHER)

## 2024-08-24 DIAGNOSIS — Z23 Encounter for immunization: Secondary | ICD-10-CM | POA: Diagnosis not present

## 2024-08-27 ENCOUNTER — Telehealth: Payer: Self-pay

## 2024-08-27 ENCOUNTER — Other Ambulatory Visit: Payer: Self-pay

## 2024-08-27 MED ORDER — COVID-19 MRNA VACC (MODERNA) 50 MCG/0.5ML IM SUSP: 0.5000 mL | Freq: Once | INTRAMUSCULAR | 0 refills | Status: AC

## 2024-08-27 NOTE — Telephone Encounter (Signed)
 I returned pt's call, RX sent.   Copied from CRM [id number]. Topic: General - Other >> Aug 26, 2024  1:26 PM Robinson H wrote: Reason for CRM: Patient states she needs a prescription sent in to [facility] for her Covid vaccine.  [place] [phone]

## 2024-08-27 NOTE — Telephone Encounter (Signed)
 I returned pt's call, RX sent.  Copied from CRM [id number]. Topic: Clinical - Medication Prior Auth >> Aug 27, 2024  9:59 AM Jasmin G wrote: Reason for CRM: Pt needs a prescription sent to her preferred pharmacy, [id number] - [place], [place] - [address], for her Covid vaccination, she states that she called yesterday but I could not find the message nor any info to relay, she would like to get the vaccination today as she states she will be going on vacation soon, call her back if needed at [phone].

## 2024-11-10 ENCOUNTER — Ambulatory Visit: Admitting: Obstetrics and Gynecology

## 2024-11-10 ENCOUNTER — Encounter: Payer: Self-pay | Admitting: Obstetrics and Gynecology

## 2024-11-10 DIAGNOSIS — N813 Complete uterovaginal prolapse: Secondary | ICD-10-CM | POA: Diagnosis not present

## 2024-11-10 DIAGNOSIS — Z85828 Personal history of other malignant neoplasm of skin: Secondary | ICD-10-CM | POA: Insufficient documentation

## 2024-11-10 DIAGNOSIS — L821 Other seborrheic keratosis: Secondary | ICD-10-CM | POA: Insufficient documentation

## 2024-11-10 DIAGNOSIS — N390 Urinary tract infection, site not specified: Secondary | ICD-10-CM

## 2024-11-10 DIAGNOSIS — Z960 Presence of urogenital implants: Secondary | ICD-10-CM

## 2024-11-10 MED ORDER — METHENAMINE HIPPURATE 1 G PO TABS: 1.0000 g | ORAL_TABLET | Freq: Two times a day (BID) | ORAL | 5 refills | Status: AC

## 2024-11-10 NOTE — Progress Notes (Signed)
 [facility]   Subjective:     Chief Complaint:  Chief Complaint  Patient presents with   Pessary Check   History of Present Illness: DALYNN JHAVERI is a 78 y.o. female with stage III pelvic organ prolapse who presents for a pessary check. She is using a size #1 cube pessary. The pessary has been working well and she has no complaints. She is using vaginal estrogen. She denies vaginal bleeding.  Past Medical History: Patient  has a past medical history of Hypoglycemia (07/30/2012), Hypokalemia (07/31/2012), Hyponatremia (07/30/2012), Low iron, Mild cognitive impairment with memory loss, Mitral valve prolapse, and Neutropenia (08/27/2016).   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   Medications: She has a current medication list which includes the following prescription(s): calcium citrate, cholecalciferol , cholestyramine , clobetasol  cream, estradiol , iron, lubricants, magnesium citrate, magnesium glycinate, metronidazole , OVER THE COUNTER MEDICATION, OVER THE COUNTER MEDICATION, OVER THE COUNTER MEDICATION, OVER THE COUNTER MEDICATION, OVER THE COUNTER MEDICATION, OVER THE COUNTER MEDICATION, peppermint oil, ascorbic acid , vitamin k, and methenamine .   Allergies: Patient is allergic to lactose intolerance (gi) and macrobid  [nitrofurantoin ].   Social History: Patient  reports that she has never smoked. She has never used smokeless tobacco. She reports that she does not currently use alcohol. She reports that she does not use drugs.      Objective:    Physical Exam: BP 109/75   Pulse 66  Gen: No apparent distress, A&O x 3. Detailed Urogynecologic Evaluation:  Pelvic Exam: Normal external female genitalia; Bartholin's and Skene's glands normal in appearance; urethral meatus with caruncle, no urethral masses or discharge. The pessary was noted to be in place. It was removed  and cleaned. Speculum exam revealed no lesions in the vagina. The pessary was replaced. It was comfortable to the patient and fit well.     Assessment/Plan:    Assessment: Ms. Ledlow is a 78 y.o. with stage III pelvic organ prolapse here for a pessary check. She is doing well.  Plan: She will remove nightly or as needed. She will continue to use estrogen. She will follow-up in 3 months for a pessary check or sooner as needed.  All questions were answered.

## 2025-01-25 ENCOUNTER — Encounter: Payer: Self-pay | Admitting: Obstetrics and Gynecology

## 2025-02-08 ENCOUNTER — Ambulatory Visit: Admitting: Obstetrics and Gynecology

## 2025-02-25 ENCOUNTER — Ambulatory Visit: Admitting: Obstetrics and Gynecology

## 2025-03-29 ENCOUNTER — Ambulatory Visit
